# Patient Record
Sex: Female | Born: 1960 | Race: White | Hispanic: No | Smoking: Never smoker
Health system: Southern US, Community
[De-identification: ages and names within clinical notes are randomized; demographics above are authoritative.]

## PROBLEM LIST (undated history)

## (undated) DIAGNOSIS — E042 Nontoxic multinodular goiter: Secondary | ICD-10-CM

## (undated) DIAGNOSIS — K219 Gastro-esophageal reflux disease without esophagitis: Secondary | ICD-10-CM

## (undated) DIAGNOSIS — I10 Essential (primary) hypertension: Secondary | ICD-10-CM

## (undated) DIAGNOSIS — E559 Vitamin D deficiency, unspecified: Secondary | ICD-10-CM

## (undated) DIAGNOSIS — F419 Anxiety disorder, unspecified: Secondary | ICD-10-CM

## (undated) DIAGNOSIS — E038 Other specified hypothyroidism: Secondary | ICD-10-CM

## (undated) DIAGNOSIS — E782 Mixed hyperlipidemia: Secondary | ICD-10-CM

## (undated) DIAGNOSIS — E039 Hypothyroidism, unspecified: Secondary | ICD-10-CM

## (undated) DIAGNOSIS — D518 Other vitamin B12 deficiency anemias: Secondary | ICD-10-CM

## (undated) DIAGNOSIS — I200 Unstable angina: Secondary | ICD-10-CM

## (undated) DIAGNOSIS — F411 Generalized anxiety disorder: Secondary | ICD-10-CM

## (undated) DIAGNOSIS — E1165 Type 2 diabetes mellitus with hyperglycemia: Secondary | ICD-10-CM

## (undated) DIAGNOSIS — M150 Primary generalized (osteo)arthritis: Secondary | ICD-10-CM

## (undated) HISTORY — DX: Hypothyroidism, unspecified: E03.9

## (undated) HISTORY — DX: Essential (primary) hypertension: I10

## (undated) HISTORY — DX: Vitamin D deficiency, unspecified: E55.9

## (undated) HISTORY — DX: Nontoxic multinodular goiter: E04.2

## (undated) HISTORY — DX: Other vitamin B12 deficiency anemias: D51.8

## (undated) HISTORY — DX: Mixed hyperlipidemia: E78.2

## (undated) HISTORY — DX: Unstable angina: I20.0

## (undated) HISTORY — DX: Anxiety disorder, unspecified: F41.9

## (undated) HISTORY — DX: Gastro-esophageal reflux disease without esophagitis: K21.9

## (undated) HISTORY — DX: Primary generalized (osteo)arthritis: M15.0

## (undated) HISTORY — DX: Generalized anxiety disorder: F41.1

## (undated) HISTORY — DX: Type 2 diabetes mellitus with hyperglycemia: E11.65

## (undated) HISTORY — DX: Other specified hypothyroidism: E03.8

---

## 1992-02-09 HISTORY — PX: CHOLECYSTECTOMY: SHX55

## 1999-01-16 ENCOUNTER — Other Ambulatory Visit: Admission: RE | Admit: 1999-01-16 | Discharge: 1999-01-16 | Payer: Self-pay | Admitting: Gynecology

## 2000-04-11 ENCOUNTER — Other Ambulatory Visit: Admission: RE | Admit: 2000-04-11 | Discharge: 2000-04-11 | Payer: Self-pay | Admitting: Gynecology

## 2000-04-12 ENCOUNTER — Other Ambulatory Visit: Admission: RE | Admit: 2000-04-12 | Discharge: 2000-04-12 | Payer: Self-pay | Admitting: Obstetrics and Gynecology

## 2000-05-05 ENCOUNTER — Encounter: Admission: RE | Admit: 2000-05-05 | Discharge: 2000-08-03 | Payer: Self-pay | Admitting: Obstetrics & Gynecology

## 2000-07-19 ENCOUNTER — Ambulatory Visit (HOSPITAL_COMMUNITY): Admission: RE | Admit: 2000-07-19 | Discharge: 2000-07-19 | Payer: Self-pay | Admitting: Obstetrics and Gynecology

## 2000-07-19 ENCOUNTER — Encounter: Payer: Self-pay | Admitting: Obstetrics and Gynecology

## 2000-09-05 ENCOUNTER — Encounter: Admission: RE | Admit: 2000-09-05 | Discharge: 2000-12-04 | Payer: Self-pay | Admitting: Obstetrics & Gynecology

## 2000-10-13 ENCOUNTER — Encounter: Payer: Self-pay | Admitting: Obstetrics and Gynecology

## 2000-10-13 ENCOUNTER — Ambulatory Visit (HOSPITAL_COMMUNITY): Admission: RE | Admit: 2000-10-13 | Discharge: 2000-10-13 | Payer: Self-pay | Admitting: Obstetrics and Gynecology

## 2000-11-28 ENCOUNTER — Encounter: Payer: Self-pay | Admitting: Obstetrics & Gynecology

## 2000-11-28 ENCOUNTER — Ambulatory Visit (HOSPITAL_COMMUNITY): Admission: RE | Admit: 2000-11-28 | Discharge: 2000-11-28 | Payer: Self-pay | Admitting: Obstetrics & Gynecology

## 2000-12-10 ENCOUNTER — Inpatient Hospital Stay (HOSPITAL_COMMUNITY): Admission: AD | Admit: 2000-12-10 | Discharge: 2000-12-13 | Payer: Self-pay | Admitting: Obstetrics and Gynecology

## 2000-12-14 ENCOUNTER — Encounter: Admission: RE | Admit: 2000-12-14 | Discharge: 2001-01-13 | Payer: Self-pay | Admitting: Obstetrics and Gynecology

## 2000-12-17 ENCOUNTER — Inpatient Hospital Stay (HOSPITAL_COMMUNITY): Admission: AD | Admit: 2000-12-17 | Discharge: 2000-12-17 | Payer: Self-pay | Admitting: Obstetrics & Gynecology

## 2001-05-17 ENCOUNTER — Other Ambulatory Visit: Admission: RE | Admit: 2001-05-17 | Discharge: 2001-05-17 | Payer: Self-pay | Admitting: Obstetrics and Gynecology

## 2002-04-19 ENCOUNTER — Other Ambulatory Visit: Admission: RE | Admit: 2002-04-19 | Discharge: 2002-04-19 | Payer: Self-pay | Admitting: Obstetrics and Gynecology

## 2003-04-24 ENCOUNTER — Other Ambulatory Visit: Admission: RE | Admit: 2003-04-24 | Discharge: 2003-04-24 | Payer: Self-pay | Admitting: Obstetrics and Gynecology

## 2003-10-09 ENCOUNTER — Other Ambulatory Visit: Admission: RE | Admit: 2003-10-09 | Discharge: 2003-10-09 | Payer: Self-pay | Admitting: Obstetrics and Gynecology

## 2004-04-29 ENCOUNTER — Other Ambulatory Visit: Admission: RE | Admit: 2004-04-29 | Discharge: 2004-04-29 | Payer: Self-pay | Admitting: Obstetrics and Gynecology

## 2005-05-05 ENCOUNTER — Other Ambulatory Visit: Admission: RE | Admit: 2005-05-05 | Discharge: 2005-05-05 | Payer: Self-pay | Admitting: Obstetrics and Gynecology

## 2016-01-05 ENCOUNTER — Other Ambulatory Visit: Payer: Self-pay | Admitting: Obstetrics and Gynecology

## 2016-01-05 DIAGNOSIS — R928 Other abnormal and inconclusive findings on diagnostic imaging of breast: Secondary | ICD-10-CM

## 2016-01-08 ENCOUNTER — Ambulatory Visit
Admission: RE | Admit: 2016-01-08 | Discharge: 2016-01-08 | Disposition: A | Discharge status: Home / Self Care | Payer: BLUE CROSS/BLUE SHIELD | Source: Ambulatory Visit | Attending: Obstetrics and Gynecology | Admitting: Obstetrics and Gynecology

## 2016-01-08 ENCOUNTER — Ambulatory Visit
Admission: RE | Admit: 2016-01-08 | Discharge: 2016-01-08 | Disposition: A | Discharge status: Home / Self Care | Payer: Self-pay | Source: Ambulatory Visit | Attending: Obstetrics and Gynecology | Admitting: Obstetrics and Gynecology

## 2016-01-08 DIAGNOSIS — R928 Other abnormal and inconclusive findings on diagnostic imaging of breast: Secondary | ICD-10-CM

## 2016-05-20 ENCOUNTER — Other Ambulatory Visit: Payer: Self-pay | Admitting: Obstetrics and Gynecology

## 2016-05-20 DIAGNOSIS — N63 Unspecified lump in unspecified breast: Secondary | ICD-10-CM

## 2016-06-17 ENCOUNTER — Ambulatory Visit
Admission: RE | Admit: 2016-06-17 | Discharge: 2016-06-17 | Disposition: A | Discharge status: Home / Self Care | Payer: BLUE CROSS/BLUE SHIELD | Source: Ambulatory Visit | Attending: Obstetrics and Gynecology | Admitting: Obstetrics and Gynecology

## 2016-06-17 DIAGNOSIS — N63 Unspecified lump in unspecified breast: Secondary | ICD-10-CM

## 2016-06-24 ENCOUNTER — Other Ambulatory Visit: Payer: Self-pay | Admitting: Obstetrics and Gynecology

## 2016-06-25 ENCOUNTER — Other Ambulatory Visit: Payer: Self-pay | Admitting: Obstetrics and Gynecology

## 2016-06-25 DIAGNOSIS — N63 Unspecified lump in unspecified breast: Secondary | ICD-10-CM

## 2016-06-25 DIAGNOSIS — N6489 Other specified disorders of breast: Secondary | ICD-10-CM

## 2017-01-12 ENCOUNTER — Ambulatory Visit
Admission: RE | Admit: 2017-01-12 | Discharge: 2017-01-12 | Disposition: A | Discharge status: Home / Self Care | Payer: BLUE CROSS/BLUE SHIELD | Source: Ambulatory Visit | Attending: Obstetrics and Gynecology | Admitting: Obstetrics and Gynecology

## 2017-01-12 ENCOUNTER — Encounter: Payer: Self-pay | Admitting: Radiology

## 2017-01-12 DIAGNOSIS — N6489 Other specified disorders of breast: Secondary | ICD-10-CM

## 2017-11-29 ENCOUNTER — Other Ambulatory Visit: Payer: Self-pay | Admitting: Obstetrics and Gynecology

## 2017-11-29 DIAGNOSIS — Z1231 Encounter for screening mammogram for malignant neoplasm of breast: Secondary | ICD-10-CM

## 2018-01-16 ENCOUNTER — Ambulatory Visit
Admission: RE | Admit: 2018-01-16 | Discharge: 2018-01-16 | Disposition: A | Discharge status: Home / Self Care | Payer: BLUE CROSS/BLUE SHIELD | Source: Ambulatory Visit | Attending: Obstetrics and Gynecology | Admitting: Obstetrics and Gynecology

## 2018-01-16 DIAGNOSIS — Z1231 Encounter for screening mammogram for malignant neoplasm of breast: Secondary | ICD-10-CM

## 2018-12-11 ENCOUNTER — Other Ambulatory Visit: Payer: Self-pay | Admitting: Obstetrics and Gynecology

## 2018-12-11 DIAGNOSIS — Z1231 Encounter for screening mammogram for malignant neoplasm of breast: Secondary | ICD-10-CM

## 2019-01-31 ENCOUNTER — Ambulatory Visit
Admission: RE | Admit: 2019-01-31 | Discharge: 2019-01-31 | Disposition: A | Discharge status: Home / Self Care | Payer: BC Managed Care – PPO | Source: Ambulatory Visit | Attending: Obstetrics and Gynecology | Admitting: Obstetrics and Gynecology

## 2019-01-31 ENCOUNTER — Other Ambulatory Visit: Payer: Self-pay

## 2019-01-31 DIAGNOSIS — Z1231 Encounter for screening mammogram for malignant neoplasm of breast: Secondary | ICD-10-CM

## 2019-03-27 ENCOUNTER — Encounter: Payer: Self-pay | Admitting: *Deleted

## 2019-03-27 ENCOUNTER — Encounter: Payer: Self-pay | Admitting: Cardiology

## 2019-03-27 DIAGNOSIS — I10 Essential (primary) hypertension: Secondary | ICD-10-CM | POA: Insufficient documentation

## 2019-03-27 DIAGNOSIS — F411 Generalized anxiety disorder: Secondary | ICD-10-CM | POA: Insufficient documentation

## 2019-03-27 DIAGNOSIS — D518 Other vitamin B12 deficiency anemias: Secondary | ICD-10-CM | POA: Insufficient documentation

## 2019-03-27 DIAGNOSIS — E559 Vitamin D deficiency, unspecified: Secondary | ICD-10-CM | POA: Insufficient documentation

## 2019-03-27 DIAGNOSIS — E782 Mixed hyperlipidemia: Secondary | ICD-10-CM | POA: Insufficient documentation

## 2019-03-27 DIAGNOSIS — E1165 Type 2 diabetes mellitus with hyperglycemia: Secondary | ICD-10-CM | POA: Insufficient documentation

## 2019-03-27 DIAGNOSIS — I200 Unstable angina: Secondary | ICD-10-CM | POA: Insufficient documentation

## 2019-03-27 DIAGNOSIS — N841 Polyp of cervix uteri: Secondary | ICD-10-CM | POA: Insufficient documentation

## 2019-03-27 DIAGNOSIS — E039 Hypothyroidism, unspecified: Secondary | ICD-10-CM | POA: Insufficient documentation

## 2019-03-27 DIAGNOSIS — Z6841 Body Mass Index (BMI) 40.0 and over, adult: Secondary | ICD-10-CM

## 2019-03-27 DIAGNOSIS — E038 Other specified hypothyroidism: Secondary | ICD-10-CM | POA: Insufficient documentation

## 2019-03-27 DIAGNOSIS — K219 Gastro-esophageal reflux disease without esophagitis: Secondary | ICD-10-CM | POA: Insufficient documentation

## 2019-03-27 DIAGNOSIS — E042 Nontoxic multinodular goiter: Secondary | ICD-10-CM | POA: Insufficient documentation

## 2019-03-27 DIAGNOSIS — M150 Primary generalized (osteo)arthritis: Secondary | ICD-10-CM | POA: Insufficient documentation

## 2019-03-27 HISTORY — DX: Polyp of cervix uteri: N84.1

## 2019-03-27 HISTORY — DX: Morbid (severe) obesity due to excess calories: E66.01

## 2019-03-27 HISTORY — DX: Body Mass Index (BMI) 40.0 and over, adult: Z684

## 2019-03-28 ENCOUNTER — Other Ambulatory Visit: Payer: Self-pay

## 2019-03-28 ENCOUNTER — Encounter: Payer: Self-pay | Admitting: *Deleted

## 2019-03-28 ENCOUNTER — Encounter: Payer: Self-pay | Admitting: Cardiology

## 2019-03-28 ENCOUNTER — Ambulatory Visit (INDEPENDENT_AMBULATORY_CARE_PROVIDER_SITE_OTHER): Payer: BC Managed Care – PPO | Admitting: Cardiology

## 2019-03-28 VITALS — BP 107/79 | HR 101 | Ht 68.0 in | Wt 329.0 lb

## 2019-03-28 DIAGNOSIS — I4891 Unspecified atrial fibrillation: Secondary | ICD-10-CM | POA: Diagnosis not present

## 2019-03-28 DIAGNOSIS — Z794 Long term (current) use of insulin: Secondary | ICD-10-CM

## 2019-03-28 DIAGNOSIS — Z13220 Encounter for screening for lipoid disorders: Secondary | ICD-10-CM | POA: Diagnosis not present

## 2019-03-28 DIAGNOSIS — I482 Chronic atrial fibrillation, unspecified: Secondary | ICD-10-CM

## 2019-03-28 DIAGNOSIS — I272 Pulmonary hypertension, unspecified: Secondary | ICD-10-CM

## 2019-03-28 DIAGNOSIS — R0602 Shortness of breath: Secondary | ICD-10-CM

## 2019-03-28 DIAGNOSIS — E119 Type 2 diabetes mellitus without complications: Secondary | ICD-10-CM | POA: Insufficient documentation

## 2019-03-28 DIAGNOSIS — Z713 Dietary counseling and surveillance: Secondary | ICD-10-CM | POA: Insufficient documentation

## 2019-03-28 DIAGNOSIS — R0989 Other specified symptoms and signs involving the circulatory and respiratory systems: Secondary | ICD-10-CM

## 2019-03-28 DIAGNOSIS — I10 Essential (primary) hypertension: Secondary | ICD-10-CM | POA: Diagnosis not present

## 2019-03-28 DIAGNOSIS — R931 Abnormal findings on diagnostic imaging of heart and coronary circulation: Secondary | ICD-10-CM

## 2019-03-28 HISTORY — DX: Abnormal findings on diagnostic imaging of heart and coronary circulation: R93.1

## 2019-03-28 HISTORY — DX: Unspecified atrial fibrillation: I48.91

## 2019-03-28 HISTORY — DX: Type 2 diabetes mellitus without complications: E11.9

## 2019-03-28 HISTORY — DX: Dietary counseling and surveillance: Z71.3

## 2019-03-28 HISTORY — DX: Chronic atrial fibrillation, unspecified: I48.20

## 2019-03-28 HISTORY — DX: Other specified symptoms and signs involving the circulatory and respiratory systems: R09.89

## 2019-03-28 HISTORY — DX: Type 2 diabetes mellitus without complications: Z79.4

## 2019-03-28 HISTORY — DX: Pulmonary hypertension, unspecified: I27.20

## 2019-03-28 MED ORDER — OLMESARTAN MEDOXOMIL 20 MG PO TABS: 20.0000 mg | ORAL_TABLET | Freq: Every day | ORAL | 1 refills | Status: AC

## 2019-03-28 MED ORDER — METOPROLOL SUCCINATE ER 25 MG PO TB24: 12.5000 mg | ORAL_TABLET | Freq: Every day | ORAL | 1 refills | Status: DC

## 2019-03-28 MED ORDER — APIXABAN 5 MG PO TABS: 5.0000 mg | ORAL_TABLET | Freq: Two times a day (BID) | ORAL | 5 refills | Status: DC

## 2019-03-28 NOTE — Patient Instructions (Signed)
Medication Instructions:  Your physician recommends that you continue on your current medications as directed. Please refer to the Current Medication list given to you today.  STOP: Aspirin STOP; Omlimsartan/HCTZ  START:Toprol XL(metoprolol succinate) 25 mg Take 1/2 tab (12.5 mg) daily START: Benicar(olmisartan) 20 mg Take 1 tab daily START: Eliquis (apixaban) 5 mg take 1 tab twice daily   *If you need a refill on your cardiac medications before your next appointment, please call your pharmacy*  Lab Work: Your physician recommends that you return for lab work in: TODAY BMP,CBC,MaGNESIUM TSH,lIPID  If you have labs (blood work) drawn today and your tests are completely normal, you will receive your results only by: Marland Kitchen MyChart Message (if you have MyChart) OR . A paper copy in the mail If you have any lab test that is abnormal or we need to change your treatment, we will call you to review the results.  Testing/Procedures:   Follow-Up: At [facility], you and your health needs are our priority.  As part of our continuing mission to provide you with exceptional heart care, we have created designated Provider Care Teams.  These Care Teams include your primary Cardiologist (physician) and Advanced Practice Providers (APPs -  Physician Assistants and Nurse Practitioners) who all work together to provide you with the care you need, when you need it.  Your next appointment:   1 month(s)  The format for your next appointment:   In Person  Provider:   Thomasene Ripple, DO  Other Instructions

## 2019-03-28 NOTE — Progress Notes (Signed)
Cardiology Office Note:    Date:  03/28/2019   ID:  Julane Crock, DOB 1960/09/22, MRN [id number]  PCP:  Galvin Proffer, MD  Cardiologist:  Thomasene Ripple, DO  Electrophysiologist:  None   Referring MD: Galvin Proffer, MD   Chief Complaint  Patient presents with  . Heart Murmur    History of Present Illness:    Suzanne Carey is a 59 y.o. female with a hx of hypertension, hyperlipidemia, morbid obesity, diabetes presents today to be evaluated for abnormal echocardiogram and depressed ejection fraction.  The patient tells me that her PCP did her echocardiogram as he noted that she was due for this.  She denies any chest pain, she admits to intermittent shortness of breath and denies any lightheadedness or dizziness.  I was able to personally review the patient's PCP notes.   Past Medical History:  Diagnosis Date  . Anxiety disorder   . Essential hypertension   . Gastro-esophageal reflux disease without esophagitis   . Generalized anxiety disorder   . Mixed hyperlipidemia   . Morbid obesity (HCC) 03/27/2019  . Nontoxic multinodular goiter   . Other specified hypothyroidism   . Other vitamin B12 deficiency anemias   . Polyp at cervical os 03/27/2019  . Primary generalized (osteo)arthritis   . Type 2 diabetes mellitus with hyperglycemia (HCC)   . Unstable angina (HCC)   . Vitamin D deficiency     Past Surgical History:  Procedure Laterality Date  . CESAREAN SECTION  2002  . CHOLECYSTECTOMY  1994    Current Medications: Current Meds  Medication Sig  . ALPRAZolam (XANAX) 0.5 MG tablet Take 0.5 mg by mouth 3 (three) times daily as needed.   . ergocalciferol (VITAMIN D2) 1.25 MG (50000 UT) capsule ergocalciferol (vitamin D2) 1,250 mcg (50,000 unit) capsule  TAKE 1 CAPSULE BY MOUTH TWICE WEEKLY  . furosemide (LASIX) 40 MG tablet Take 40 mg by mouth daily.  Marland Kitchen liraglutide (VICTOZA) 18 MG/3ML SOPN Victoza 3-Pak 0.6 mg/0.1 mL (18 mg/3 mL) subcutaneous pen injector  INJECT 1.8 MG ONCE A DAY SUBCUTANEOUSLY 30 DAYS  . naproxen (NAPROSYN) 500 MG tablet Take 500 mg by mouth 2 (two) times daily as needed.   . nitroGLYCERIN (NITROSTAT) 0.4 MG SL tablet Place under the tongue.  Marland Kitchen omeprazole (PRILOSEC) 40 MG capsule 40 mg daily.  . pioglitazone (ACTOS) 30 MG tablet Take 30 mg by mouth daily.  . [DISCONTINUED] aspirin 81 MG chewable tablet aspirin 81 mg chewable tablet  Chew 1 tablet every day by oral route.  . [DISCONTINUED] olmesartan-hydrochlorothiazide (BENICAR HCT) 40-25 MG tablet Take 1 tablet by mouth daily.     Allergies:   Dapagliflozin and Latex   Social History   Socioeconomic History  . Marital status: Married    Spouse name: Not on file  . Number of children: Not on file  . Years of education: Not on file  . Highest education level: Not on file  Occupational History  . Not on file  Tobacco Use  . Smoking status: Never Smoker  . Smokeless tobacco: Never Used  Substance and Sexual Activity  . Alcohol use: Never  . Drug use: Never  . Sexual activity: Not on file  Other Topics Concern  . Not on file  Social History Narrative  . Not on file   Social Determinants of Health   Financial Resource Strain:   . Difficulty of Paying Living Expenses: Not on file  Food Insecurity:   . Worried About  Running Out of Food in the Last Year: Not on file  . Ran Out of Food in the Last Year: Not on file  Transportation Needs:   . Lack of Transportation (Medical): Not on file  . Lack of Transportation (Non-Medical): Not on file  Physical Activity:   . Days of Exercise per Week: Not on file  . Minutes of Exercise per Session: Not on file  Stress:   . Feeling of Stress : Not on file  Social Connections:   . Frequency of Communication with Friends and Family: Not on file  . Frequency of Social Gatherings with Friends and Family: Not on file  . Attends Religious Services: Not on file  . Active Member of Clubs or Organizations: Not on file  .  Attends Archivist Meetings: Not on file  . Marital Status: Not on file     Family History: The patient's family history includes Breast cancer (age of onset: 68) in her cousin and paternal aunt; Cancer in her brother; Diabetes in her mother; Heart disease in her mother and sister; Hypertension in her mother and sister; Multiple sclerosis in her sister; Stroke in her mother.  ROS:   Review of Systems  Constitution: Negative for decreased appetite, fever and weight gain.  HENT: Negative for congestion, ear discharge, hoarse voice and sore throat.   Eyes:Reports  Negative for discharge, redness, vision loss in right eye and visual halos.  Cardiovascular: Reports dyspnea on exertion.  Negative for chest pain, leg swelling, orthopnea and palpitations.  Respiratory: Negative for cough, hemoptysis, shortness of breath and snoring.   Endocrine: Negative for heat intolerance and polyphagia.  Hematologic/Lymphatic: Negative for bleeding problem. Does not bruise/bleed easily.  Skin: Negative for flushing, nail changes, rash and suspicious lesions.  Musculoskeletal: Negative for arthritis, joint pain, muscle cramps, myalgias, neck pain and stiffness.  Gastrointestinal: Negative for abdominal pain, bowel incontinence, diarrhea and excessive appetite.  Genitourinary: Negative for decreased libido, genital sores and incomplete emptying.  Neurological: Negative for brief paralysis, focal weakness, headaches and loss of balance.  Psychiatric/Behavioral: Negative for altered mental status, depression and suicidal ideas.  Allergic/Immunologic: Negative for HIV exposure and persistent infections.    EKGs/Labs/Other Studies Reviewed:    The following studies were reviewed today:   EKG:  The ekg ordered today demonstrates atrial fibrillation with rapid ventricular rate, heart rate 101 bpm with low voltage.  No prior EKG for comparison.  Transthoracic echocardiogram done on March 21, 2019  reports technically difficult study rhythm was atrial fibrillation.  Normal left ventricular chambers.  Mildly reduced left ventricular systolic function 45 to 71%.  Mild global hypokinesis.  Mild concentric left ventricular hypertrophy.  No mitral E velocity available for evaluation of diastolic function.  No evidence of left ventricular mass or thrombus.  The left ventricle was normal in function and size.  Mildly dilated left atrium.  Moderately dilated right atrium.  No evidence of intra-atrial shunt.  Aortic valve was not well visualized.  No aortic regurgitation.  No aortic stenosis.  Mitral valve was not well visualized.  Mild mitral vegetation.  No mitral stenosis.  Tricuspid valve not well visualized.  Mild tricuspid regurgitation.  Moderate pulmonary hypertension RSVP 55 mmHg.  Pulmonic valve is not well visualized.  Trace pulmonic regurgitation.  No pulmonic stenosis.  Normal aortic root size.  Normal proximal ascending aortic root size.  Dilated inferior vena cava with inspiratory collapse consistent with right atrial pressure which is elevated 50 mmHg.  Recent Labs: No  results found for requested labs within last 8760 hours.  Recent Lipid Panel No results found for: CHOL, TRIG, HDL, CHOLHDL, VLDL, LDLCALC, LDLDIRECT  Physical Exam:    VS:  BP 107/79 (BP Location: Left Arm, Patient Position: Sitting, Cuff Size: Normal)   Pulse (!) 101   Ht 5\' 8"  (1.727 m)   Wt (!) 329 lb (149.2 kg)   SpO2 98%   BMI 50.02 kg/m     Wt Readings from Last 3 Encounters:  03/28/19 (!) 329 lb (149.2 kg)  03/21/19 (!) 330 lb (149.7 kg)     GEN: Well nourished, well developed in no acute distress HEENT: Normal NECK: No JVD; No carotid bruits LYMPHATICS: No lymphadenopathy CARDIAC: S1S2 noted,RRR, 2/6 systolic murmurs, rubs, gallops RESPIRATORY:  Clear to auscultation without rales, wheezing or rhonchi  ABDOMEN: Soft, non-tender, non-distended, +bowel sounds, no guarding. EXTREMITIES: bilateral leg  edema, No cyanosis, no clubbing MUSCULOSKELETAL:  No deformity  SKIN: Warm and dry NEUROLOGIC:  Alert and oriented x 3, non-focal PSYCHIATRIC:  Normal affect, good insight  ASSESSMENT:    1. Atrial fibrillation, unspecified type (HCC)   2. Lipid screening   3. Essential hypertension   4. Type 2 diabetes mellitus without complication, with long-term current use of insulin (HCC)   5. Morbid obesity (HCC)   6. Depressed left ventricular ejection fraction   7. Pulmonary hypertension (HCC)   8. Shortness of breath    PLAN:     New onset atrial fibrillation-this visit was heavily weighted on educating the patient about her new diagnosis.  Discussing various treatment plans with her and what to expect in terms of long-term treatment with her diagnosis of atrial fibrillation.  I educated the patient about rate control strategy, rhythm control strategy and as well as stroke prevention.  Today her heart rate is not controlled in the office therefore I am going to start her on Toprol-XL 12.5 mg to help with heart rate control I am hoping that if we can slow her heart rate down will be able to have her spontaneously converted to sinus rhythm.  In the event that she had a does not convert to sinus rhythm will likely consider DC cardioversion.  In terms of stroke prevention her CHA2DS2-VASc score is 4 which is an indication for anticoagulation for stroke prevention.  I discussed this with the patient she is willing to proceed with anticoagulation.  She will be started on Eliquis 5 mg twice daily.  She was educated about this medication all of her questions were answered.    I have educated patient on the side effects of this medication. I also urge her to abstain from any taking behaviors.  She understands that she is now at a high risk of bleeding due to being on anticoagulation.  She was also advised that if she ever falls and especially hit her head to be seen in the emergency department.   Depressed  left ventricular systolic function-her EF is mildly depressed 45 to 50% on echocardiogram that was done at another facility which I reviewed the report.  She has been started on low-dose beta-blocker as stated above for atrial fibrillation which hopefully will be able to help with her depressed LVEF.  I suspect that her mildly depressed ejection fraction likely be tachycardia induced.  She does not report any anginal symptoms, her shortness of breath may be contributing also to her arrhythmia.  For now I like to treat the arrhythmia hopefully when she is back she is  back in sinus rhythm if the symptoms persist we will certainly will pursue an ischemic evaluation.  Of note she tells me that probably about 5 to 10 years ago she had a cardiac catheterization which was reported as normal.  She is being treated for hypertension and she is on antihypertensive medication with Benicar/hydrochlorothiazide combination.  Her systolic blood pressures on the lower side today in the lower 100s she tells me this is what it usually is at home.  Today I am going to stop the hydrochlorothiazide, decrease her Benicar to 20 mg as I have started the patient on Toprol-XL for rate control for atrial fibrillation.  I am hoping that the Benicar 20 mg as well as Toprol-XL 12.5 mg to keep the patient blood pressure under a target of 130/80 mmHg.  In addition she is also on Lasix 40 mg daily.  The patient does have nursing, weekly at her house she is going to have the nurse take her blood pressure once a week she is and will require this which she will bring at her next office visit.  Bilateral leg edema-continue patient on her Lasix 40 mg daily.  Diabetes type 2-continue patient on her current diabetic medication per her PCP.  Blood work will be done today which will include BMP, mag, CBC, TSH and lipid profile.  Morbid obesity-the patient understands the need to lose weight with diet and exercise. We have discussed specific  strategies for this.  The patient is in agreement with the above plan. The patient left the office in stable condition.  The patient will follow up in 1 month or sooner if needed.  Total visit time 55 minutes.   Medication Adjustments/Labs and Tests Ordered: Current medicines are reviewed at length with the patient today.  Concerns regarding medicines are outlined above.  Orders Placed This Encounter  Procedures  . Basic Metabolic Panel (BMET)  . Magnesium  . CBC  . TSH  . Lipid Profile  . EKG 12-Lead   Meds ordered this encounter  Medications  . olmesartan (BENICAR) 20 MG tablet    Sig: Take 1 tablet (20 mg total) by mouth daily.    Dispense:  90 tablet    Refill:  1  . metoprolol succinate (TOPROL XL) 25 MG 24 hr tablet    Sig: Take 0.5 tablets (12.5 mg total) by mouth daily.    Dispense:  45 tablet    Refill:  1  . apixaban (ELIQUIS) 5 MG TABS tablet    Sig: Take 1 tablet (5 mg total) by mouth 2 (two) times daily.    Dispense:  60 tablet    Refill:  5    Patient Instructions  Medication Instructions:  Your physician recommends that you continue on your current medications as directed. Please refer to the Current Medication list given to you today.  STOP: Aspirin STOP; Omlimsartan/HCTZ  START:Toprol XL(metoprolol succinate) 25 mg Take 1/2 tab (12.5 mg) daily START: Benicar(olmisartan) 20 mg Take 1 tab daily START: Eliquis (apixaban) 5 mg take 1 tab twice daily   *If you need a refill on your cardiac medications before your next appointment, please call your pharmacy*  Lab Work: Your physician recommends that you return for lab work in: TODAY BMP,CBC,MaGNESIUM TSH,lIPID  If you have labs (blood work) drawn today and your tests are completely normal, you will receive your results only by: Marland Kitchen MyChart Message (if you have MyChart) OR . A paper copy in the mail If you have any lab  test that is abnormal or we need to change your treatment, we will call you to review  the results.  Testing/Procedures:   Follow-Up: At [facility], you and your health needs are our priority.  As part of our continuing mission to provide you with exceptional heart care, we have created designated Provider Care Teams.  These Care Teams include your primary Cardiologist (physician) and Advanced Practice Providers (APPs -  Physician Assistants and Nurse Practitioners) who all work together to provide you with the care you need, when you need it.  Your next appointment:   1 month(s)  The format for your next appointment:   In Person  Provider:   Thomasene Ripple, DO  Other Instructions      Adopting a Healthy Lifestyle.  Know what a healthy weight is for you (roughly BMI <25) and aim to maintain this   Aim for 7+ servings of fruits and vegetables daily   65-80+ fluid ounces of water or unsweet tea for healthy kidneys   Limit to max 1 drink of alcohol per day; avoid smoking/tobacco   Limit animal fats in diet for cholesterol and heart health - choose grass fed whenever available   Avoid highly processed foods, and foods high in saturated/trans fats   Aim for low stress - take time to unwind and care for your mental health   Aim for 150 min of moderate intensity exercise weekly for heart health, and weights twice weekly for bone health   Aim for 7-9 hours of sleep daily   When it comes to diets, agreement about the perfect plan isnt easy to find, even among the experts. Experts at the [facility] [employer] developed an idea known as the Healthy Eating Plate. Just imagine a plate divided into logical, healthy portions.   The emphasis is on diet quality:   Load up on vegetables and fruits - one-half of your plate: Aim for color and variety, and remember that potatoes dont count.   Go for whole grains - one-quarter of your plate: Whole wheat, barley, wheat berries, quinoa, oats, brown rice, and foods made with them. If you want pasta, go with whole  wheat pasta.   Protein power - one-quarter of your plate: Fish, chicken, beans, and nuts are all healthy, versatile protein sources. Limit red meat.   The diet, however, does go beyond the plate, offering a few other suggestions.   Use healthy plant oils, such as olive, canola, soy, corn, sunflower and peanut. Check the labels, and avoid partially hydrogenated oil, which have unhealthy trans fats.   If youre thirsty, drink water. Coffee and tea are good in moderation, but skip sugary drinks and limit milk and dairy products to one or two daily servings.   The type of carbohydrate in the diet is more important than the amount. Some sources of carbohydrates, such as vegetables, fruits, whole grains, and beans-are healthier than others.   Finally, stay active  Signed, Thomasene Ripple, DO  03/28/2019 10:49 PM    [facility]

## 2019-03-29 LAB — CBC
Hematocrit: 39.1 % (ref 34.0–46.6)
Hemoglobin: 13.1 g/dL (ref 11.1–15.9)
MCH: 29.5 pg (ref 26.6–33.0)
MCHC: 33.5 g/dL (ref 31.5–35.7)
MCV: 88 fL (ref 79–97)
Platelets: 322 10*3/uL (ref 150–450)
RBC: 4.44 x10E6/uL (ref 3.77–5.28)
RDW: 13.7 % (ref 11.7–15.4)
WBC: 9.7 10*3/uL (ref 3.4–10.8)

## 2019-03-29 LAB — LIPID PANEL
Chol/HDL Ratio: 4.4 ratio (ref 0.0–4.4)
Cholesterol, Total: 186 mg/dL (ref 100–199)
HDL: 42 mg/dL (ref >39)
LDL Chol Calc (NIH): 116 mg/dL — ABNORMAL HIGH (ref 0–99)
Triglycerides: 157 mg/dL — ABNORMAL HIGH (ref 0–149)
VLDL Cholesterol Cal: 28 mg/dL (ref 5–40)

## 2019-03-29 LAB — TSH: TSH: 2.2 u[IU]/mL (ref 0.450–4.500)

## 2019-03-29 LAB — BASIC METABOLIC PANEL
BUN/Creatinine Ratio: 19 (ref 12–28)
BUN: 23 mg/dL (ref 8–27)
CO2: 25 mmol/L (ref 20–29)
Calcium: 9.9 mg/dL (ref 8.7–10.3)
Chloride: 97 mmol/L (ref 96–106)
Creatinine, Ser: 1.23 mg/dL — ABNORMAL HIGH (ref 0.57–1.00)
GFR calc Af Amer: 55 mL/min/{1.73_m2} — ABNORMAL LOW (ref >59)
GFR calc non Af Amer: 48 mL/min/{1.73_m2} — ABNORMAL LOW (ref >59)
Glucose: 110 mg/dL — ABNORMAL HIGH (ref 65–99)
Potassium: 4.2 mmol/L (ref 3.5–5.2)
Sodium: 139 mmol/L (ref 134–144)

## 2019-03-29 LAB — MAGNESIUM: Magnesium: 1.9 mg/dL (ref 1.6–2.3)

## 2019-04-02 ENCOUNTER — Other Ambulatory Visit: Payer: Self-pay | Admitting: *Deleted

## 2019-04-02 ENCOUNTER — Telehealth: Payer: Self-pay | Admitting: *Deleted

## 2019-04-02 MED ORDER — ATORVASTATIN CALCIUM 20 MG PO TABS: 20.0000 mg | ORAL_TABLET | Freq: Every day | ORAL | 1 refills | Status: DC

## 2019-04-02 NOTE — Telephone Encounter (Signed)
Spoke with patient. Informed of lab results and need to start Lipitor 20 mg Take 1 daily. Called into [facility] in [place]. Called for copy of labs from PCP.

## 2019-04-02 NOTE — Telephone Encounter (Signed)
-----   Message from Thomasene Ripple, DO sent at 04/01/2019  8:40 PM EST ----- Notify patient her creatinine is 1.23, I would like to see her lab work from her PCP office to compare.  Her LDL is slightly elevated 160, triglyceride 157.  If she can tolerate I like to start her on atorvastatin 20 mg daily.

## 2019-04-06 ENCOUNTER — Telehealth: Payer: Self-pay | Admitting: Cardiology

## 2019-04-06 NOTE — Telephone Encounter (Signed)
I spoke with patient and gave her information from Dr Servando Salina. Appointment moved from March 17 to March 8 at 12:00.  Pt reports she has decided not to start Atorvastatin at this time.

## 2019-04-06 NOTE — Telephone Encounter (Signed)
That is not usually a reaction that occurs with these 2 medication.  But I can see her in the office sooner than her scheduled appointment.

## 2019-04-06 NOTE — Telephone Encounter (Signed)
I spoke with patient. Her BP was checked yesterday and it was 110/82.  No other readings available.  She is trying to purchase a BP cuff. Patient reports she has had a headache off and on since Sunday. It will go away and then come back. Also face has felt flushed and looks red several times since Sunday. Happens about 3 times daily. Lasts about 5-10 minutes. Patient is concerned headache and flushing could be related to her new medications.

## 2019-04-06 NOTE — Telephone Encounter (Signed)
New Message    Pt c/o medication issue:  1. Name of Medication: metoprolol succinate (TOPROL XL) 25 MG 24 hr tablet apixaban (ELIQUIS) 5 MG TABS tablet  2. How are you currently taking this medication (dosage and times per day)? Metoprolol 12.5 mg daily  Eliquis 5 mg 2 x daily   3. Are you having a reaction (difficulty breathing--STAT)? No   4. What is your medication issue? Dull headache and face gets real red, she feels like her BP is rising  She had the nurse check it yesterday and it was 110/82   Please call

## 2019-04-16 ENCOUNTER — Ambulatory Visit: Payer: BC Managed Care – PPO | Admitting: Cardiology

## 2019-04-25 ENCOUNTER — Ambulatory Visit: Payer: BC Managed Care – PPO | Admitting: Cardiology

## 2019-04-25 ENCOUNTER — Encounter: Payer: Self-pay | Admitting: Cardiology

## 2019-04-25 ENCOUNTER — Other Ambulatory Visit: Payer: Self-pay

## 2019-04-25 ENCOUNTER — Ambulatory Visit (INDEPENDENT_AMBULATORY_CARE_PROVIDER_SITE_OTHER): Payer: BC Managed Care – PPO | Admitting: Cardiology

## 2019-04-25 VITALS — BP 150/95 | HR 82 | Ht 68.0 in | Wt 340.0 lb

## 2019-04-25 DIAGNOSIS — I272 Pulmonary hypertension, unspecified: Secondary | ICD-10-CM

## 2019-04-25 DIAGNOSIS — I10 Essential (primary) hypertension: Secondary | ICD-10-CM

## 2019-04-25 DIAGNOSIS — Z794 Long term (current) use of insulin: Secondary | ICD-10-CM

## 2019-04-25 DIAGNOSIS — E119 Type 2 diabetes mellitus without complications: Secondary | ICD-10-CM

## 2019-04-25 DIAGNOSIS — I4891 Unspecified atrial fibrillation: Secondary | ICD-10-CM | POA: Diagnosis not present

## 2019-04-25 DIAGNOSIS — R0989 Other specified symptoms and signs involving the circulatory and respiratory systems: Secondary | ICD-10-CM

## 2019-04-25 DIAGNOSIS — E782 Mixed hyperlipidemia: Secondary | ICD-10-CM

## 2019-04-25 MED ORDER — METOPROLOL SUCCINATE ER 25 MG PO TB24: 25.0000 mg | ORAL_TABLET | Freq: Every day | ORAL | 1 refills | Status: DC

## 2019-04-25 MED ORDER — HYDROCHLOROTHIAZIDE 25 MG PO TABS: 25.0000 mg | ORAL_TABLET | Freq: Every day | ORAL | 1 refills | Status: DC

## 2019-04-25 NOTE — Progress Notes (Signed)
Cardiology Office Note:    Date:  04/25/2019   ID:  Suzanne Carey, DOB [birth year], MRN [id number]  PCP:  Galvin Proffer, MD  Cardiologist:  Thomasene Ripple, DO  Electrophysiologist:  None   Referring MD: Galvin Proffer, MD   Chief Complaint  Patient presents with  . Follow-up    History of Present Illness:    Suzanne Carey is a 59 y.o. female with a hx of atrial fibrillation recently started on Eliquis, hypertension, hyperlipidemia, morbid obesity, diabetes presents today for follow-up visit.  Patient March 28, 2019 at that time she was in atrial fibrillation and started on Eliquis this was a new diagnosis, in addition I placed the patient on metoprolol succinate 12.5 mg daily, with avoiding hypotension I stopped her hydrochlorothiazide and her Benicar was decreased to 20 mg daily due to the fact that her systolic blood pressure was she was in the office.  In the interim the patient tells me that her blood pressure has been increasing gradually.  She also has had increasing bilateral leg edema.  Past Medical History:  Diagnosis Date  . Anxiety disorder   . Atrial fibrillation (HCC) 03/28/2019  . Essential hypertension   . Gastro-esophageal reflux disease without esophagitis   . Generalized anxiety disorder   . Mixed hyperlipidemia   . Morbid obesity (HCC) 03/27/2019  . Nontoxic multinodular goiter   . Other specified hypothyroidism   . Other vitamin B12 deficiency anemias   . Polyp at cervical os 03/27/2019  . Primary generalized (osteo)arthritis   . Pulmonary hypertension (HCC) 03/28/2019  . Type 2 diabetes mellitus with hyperglycemia (HCC)   . Type 2 diabetes mellitus without complication, with long-term current use of insulin (HCC) 03/28/2019  . Unstable angina (HCC)   . Vitamin D deficiency     Past Surgical History:  Procedure Laterality Date  . CESAREAN SECTION  2002  . CHOLECYSTECTOMY  1994    Current Medications: Current Meds  Medication Sig  .  ALPRAZolam (XANAX) 0.5 MG tablet Take 0.5 mg by mouth 3 (three) times daily as needed.   Marland Kitchen apixaban (ELIQUIS) 5 MG TABS tablet Take 1 tablet (5 mg total) by mouth 2 (two) times daily.  Marland Kitchen atorvastatin (LIPITOR) 20 MG tablet Take 1 tablet (20 mg total) by mouth daily.  . ergocalciferol (VITAMIN D2) 1.25 MG (50000 UT) capsule ergocalciferol (vitamin D2) 1,250 mcg (50,000 unit) capsule  TAKE 1 CAPSULE BY MOUTH TWICE WEEKLY  . furosemide (LASIX) 40 MG tablet Take 40 mg by mouth daily.  Marland Kitchen liraglutide (VICTOZA) 18 MG/3ML SOPN Victoza 3-Pak 0.6 mg/0.1 mL (18 mg/3 mL) subcutaneous pen injector  INJECT 1.8 MG ONCE A DAY SUBCUTANEOUSLY 30 DAYS  . metoprolol succinate (TOPROL XL) 25 MG 24 hr tablet Take 1 tablet (25 mg total) by mouth daily.  . naproxen (NAPROSYN) 500 MG tablet Take 500 mg by mouth 2 (two) times daily as needed.   . nitroGLYCERIN (NITROSTAT) 0.4 MG SL tablet Place under the tongue.  Marland Kitchen olmesartan (BENICAR) 20 MG tablet Take 1 tablet (20 mg total) by mouth daily.  Marland Kitchen omeprazole (PRILOSEC) 40 MG capsule 40 mg daily.  . pioglitazone (ACTOS) 30 MG tablet Take 30 mg by mouth daily.  . [DISCONTINUED] metoprolol succinate (TOPROL XL) 25 MG 24 hr tablet Take 0.5 tablets (12.5 mg total) by mouth daily.     Allergies:   Dapagliflozin and Latex   Social History   Socioeconomic History  . Marital status: Married  Spouse name: Not on file  . Number of children: Not on file  . Years of education: Not on file  . Highest education level: Not on file  Occupational History  . Not on file  Tobacco Use  . Smoking status: Never Smoker  . Smokeless tobacco: Never Used  Substance and Sexual Activity  . Alcohol use: Never  . Drug use: Never  . Sexual activity: Not on file  Other Topics Concern  . Not on file  Social History Narrative  . Not on file   Social Determinants of Health   Financial Resource Strain:   . Difficulty of Paying Living Expenses:   Food Insecurity:   . Worried About  Programme researcher, broadcasting/film/video in the Last Year:   . Barista in the Last Year:   Transportation Needs:   . Freight forwarder (Medical):   Marland Kitchen Lack of Transportation (Non-Medical):   Physical Activity:   . Days of Exercise per Week:   . Minutes of Exercise per Session:   Stress:   . Feeling of Stress :   Social Connections:   . Frequency of Communication with Friends and Family:   . Frequency of Social Gatherings with Friends and Family:   . Attends Religious Services:   . Active Member of Clubs or Organizations:   . Attends Banker Meetings:   Marland Kitchen Marital Status:      Family History: The patient's family history includes Breast cancer (age of onset: 81) in her cousin and paternal aunt; Cancer in her brother; Diabetes in her mother; Heart disease in her mother and sister; Hypertension in her mother and sister; Multiple sclerosis in her sister; Stroke in her mother.  ROS:   Review of Systems  Constitution: Negative for decreased appetite, fever and weight gain.  HENT: Negative for congestion, ear discharge, hoarse voice and sore throat.   Eyes: Negative for discharge, redness, vision loss in right eye and visual halos.  Cardiovascular: Negative for chest pain, dyspnea on exertion, leg swelling, orthopnea and palpitations.  Respiratory: Negative for cough, hemoptysis, shortness of breath and snoring.   Endocrine: Negative for heat intolerance and polyphagia.  Hematologic/Lymphatic: Negative for bleeding problem. Does not bruise/bleed easily.  Skin: Negative for flushing, nail changes, rash and suspicious lesions.  Musculoskeletal: Negative for arthritis, joint pain, muscle cramps, myalgias, neck pain and stiffness.  Gastrointestinal: Negative for abdominal pain, bowel incontinence, diarrhea and excessive appetite.  Genitourinary: Negative for decreased libido, genital sores and incomplete emptying.  Neurological: Negative for brief paralysis, focal weakness, headaches and  loss of balance.  Psychiatric/Behavioral: Negative for altered mental status, depression and suicidal ideas.  Allergic/Immunologic: Negative for HIV exposure and persistent infections.    EKGs/Labs/Other Studies Reviewed:    The following studies were reviewed today:   EKG: None today  Transthoracic echocardiogram done on March 21, 2019 reports technically difficult study rhythm was atrial fibrillation.  Normal left ventricular chambers.  Mildly reduced left ventricular systolic function 45 to 50%.  Mild global hypokinesis.  Mild concentric left ventricular hypertrophy.  No mitral E velocity available for evaluation of diastolic function.  No evidence of left ventricular mass or thrombus.  The left ventricle was normal in function and size.  Mildly dilated left atrium.  Moderately dilated right atrium.  No evidence of intra-atrial shunt.  Aortic valve was not well visualized.  No aortic regurgitation.  No aortic stenosis.  Mitral valve was not well visualized.  Mild mitral vegetation.  No  mitral stenosis.  Tricuspid valve not well visualized.  Mild tricuspid regurgitation.  Moderate pulmonary hypertension RSVP 55 mmHg.  Pulmonic valve is not well visualized.  Trace pulmonic regurgitation.  No pulmonic stenosis.  Normal aortic root size.  Normal proximal ascending aortic root size.  Dilated inferior vena cava with inspiratory collapse consistent with right atrial pressure which is elevated 50 mmHg.  Recent Labs: 03/28/2019: BUN 23; Creatinine, Ser 1.23; Hemoglobin 13.1; Magnesium 1.9; Platelets 322; Potassium 4.2; Sodium 139; TSH 2.200  Recent Lipid Panel    Component Value Date/Time   CHOL 186 03/28/2019 1136   TRIG 157 (H) 03/28/2019 1136   HDL 42 03/28/2019 1136   CHOLHDL 4.4 03/28/2019 1136   LDLCALC 116 (H) 03/28/2019 1136    Physical Exam:    VS:  BP (!) 150/95 (BP Location: Right Arm, Patient Position: Sitting, Cuff Size: Normal)   Pulse 82   Ht 5\' 8"  (1.727 m)   Wt (!) 340 lb  (154.2 kg)   SpO2 97%   BMI 51.70 kg/m     Wt Readings from Last 3 Encounters:  04/25/19 (!) 340 lb (154.2 kg)  03/28/19 (!) 329 lb (149.2 kg)  03/21/19 (!) 330 lb (149.7 kg)     GEN: Obese female well nourished, well developed in no acute distress HEENT: Normal NECK: No JVD; No carotid bruits LYMPHATICS: No lymphadenopathy CARDIAC: S1S2 noted,RRR, 2/6 systolic murmurs, rubs, gallops RESPIRATORY:  Clear to auscultation without rales, wheezing or rhonchi  ABDOMEN: Soft, non-tender, non-distended, +bowel sounds, no guarding. EXTREMITIES: bilateral +2 leg edema, No cyanosis, no clubbing MUSCULOSKELETAL:  No deformity  SKIN: Warm and dry NEUROLOGIC:  Alert and oriented x 3, non-focal PSYCHIATRIC:  Normal affect, good insight   ASSESSMENT:    1. Essential hypertension   2. Atrial fibrillation, unspecified type (HCC)   3. Pulmonary hypertension (HCC)   4. Type 2 diabetes mellitus without complication, with long-term current use of insulin (HCC)   5. Morbid obesity (HCC)   6. Mixed hyperlipidemia   7. Depressed left ventricular ejection fraction    PLAN:     Unfortunately the patient did not tolerate being off of the hydrochlorothiazide, she became significant weight since I last saw her with bilateral leg edema. Restart her hydrochlorothiazide 25 mg daily.  We will continue continue on her Lasix 40 mg daily.  I also asked the patient to take an extra Lasix dose next week if she continues to gain weight of 40 mg twice a day every other day for 3 days.  Hypertension-patient hypertensive in the office and fortunately she did not want to medication change previously started hydrochlorothiazide 25 mg Benicar 20 mg daily for now, and continue Lasix 40 mg daily.  Also increasing her Toprol-XL to 25 mg daily.  In terms of her atrial fibrillation her rate is controlled today but she tells me that at home she is usually above 100 bpm  and has not seen any heart rate less than 100.  I am  going to increase her Toprol-XL to 25 mg daily.  Continue Eliquis 5 mg twice daily  Obesity - she has started on a new diet and is looking forward to weight loss.   Diabetes mellitus-primary care doctor.  Depressed ejection fraction-continue current management.   The patient is in agreement with the above plan. The patient left the office in stable condition.  The patient will follow up in 2 weeks or sooner if needed.   Medication Adjustments/Labs and Tests Ordered: Current  medicines are reviewed at length with the patient today.  Concerns regarding medicines are outlined above.  No orders of the defined types were placed in this encounter.  Meds ordered this encounter  Medications  . hydrochlorothiazide (HYDRODIURIL) 25 MG tablet    Sig: Take 1 tablet (25 mg total) by mouth daily.    Dispense:  90 tablet    Refill:  1  . metoprolol succinate (TOPROL XL) 25 MG 24 hr tablet    Sig: Take 1 tablet (25 mg total) by mouth daily.    Dispense:  90 tablet    Refill:  1    Patient Instructions  Medication Instructions:  Your physician has recommended you make the following change in your medication:   START: Hydrochlorothiazide 25 mg daily  INCREASE: Metoprolol succinate to 25 mg daily  *If you need a refill on your cardiac medications before your next appointment, please call your pharmacy*   Lab Work: None.  If you have labs (blood work) drawn today and your tests are completely normal, you will receive your results only by: Marland Kitchen. MyChart Message (if you have MyChart) OR . A paper copy in the mail If you have any lab test that is abnormal or we need to change your treatment, we will call you to review the results.   Testing/Procedures: None.   Follow-Up: At [facility], you and your health needs are our priority.  As part of our continuing mission to provide you with exceptional heart care, we have created designated Provider Care Teams.  These Care Teams include your  primary Cardiologist (physician) and Advanced Practice Providers (APPs -  Physician Assistants and Nurse Practitioners) who all work together to provide you with the care you need, when you need it.  We recommend signing up for the patient portal called "MyChart".  Sign up information is provided on this After Visit Summary.  MyChart is used to connect with patients for Virtual Visits (Telemedicine).  Patients are able to view lab/test results, encounter notes, upcoming appointments, etc.  Non-urgent messages can be sent to your provider as well.   To learn more about what you can do with MyChart, go to [email or web address].    Your next appointment:   2 week(s)  The format for your next appointment:   In Person  Provider:   Thomasene RippleKardie Bentleigh Stankus, DO   Other Instructions  Hydrochlorothiazide, HCTZ Oral Capsules or Tablets What is this medicine? HYDROCHLOROTHIAZIDE (hye droe klor oh THYE a zide) is a diuretic. It helps you make more urine and to lose salt and excess water from your body. It treats swelling from heart, kidney, or liver disease. It also treats high blood pressure. This medicine may be used for other purposes; ask your health care provider or pharmacist if you have questions. COMMON BRAND NAME(S): Esidrix, Ezide, HydroDIURIL, Microzide, Oretic, Zide What should I tell my health care provider before I take this medicine? They need to know if you have any of these conditions:  diabetes  gout  immune system problems, like lupus  kidney disease or kidney stones  liver disease  pancreatitis  small amount of urine or difficulty passing urine  an unusual or allergic reaction to hydrochlorothiazide, sulfa drugs, other medicines, foods, dyes, or preservatives  pregnant or trying to get pregnant  breast-feeding How should I use this medicine? Take this drug by mouth. Take it as directed on the prescription label at the same time every day. You can take it with or without  food. If it upsets your stomach, take it with food. Keep taking it unless your health care provider tells you to stop. Talk to your health care provider about the use of this drug in children. While it may be prescribed for children as young as newborns for selected conditions, precautions do apply. Overdosage: If you think you have taken too much of this medicine contact a poison control center or emergency room at once. NOTE: This medicine is only for you. Do not share this medicine with others. What if I miss a dose? If you miss a dose, take it as soon as you can. If it is almost time for your next dose, take only that dose. Do not take double or extra doses. What may interact with this medicine?  cholestyramine  colestipol  digoxin  dofetilide  lithium  medicines for blood pressure  medicines for diabetes  medicines that relax muscles for surgery  other diuretics  steroid medicines like prednisone or cortisone This list may not describe all possible interactions. Give your health care provider a list of all the medicines, herbs, non-prescription drugs, or dietary supplements you use. Also tell them if you smoke, drink alcohol, or use illegal drugs. Some items may interact with your medicine. What should I watch for while using this medicine? Visit your doctor or health care professional for regular checks on your progress. Check your blood pressure as directed. Ask your doctor or health care professional what your blood pressure should be and when you should contact him or her. Talk to your health care professional about your risk of skin cancer. You may be more at risk for skin cancer if you take this medicine. This medicine can make you more sensitive to the sun. Keep out of the sun. If you cannot avoid being in the sun, wear protective clothing and use sunscreen. Do not use sun lamps or tanning beds/booths. You may need to be on a special diet while taking this medicine. Ask  your doctor. Check with your doctor or health care professional if you get an attack of severe diarrhea, nausea and vomiting, or if you sweat a lot. The loss of too much body fluid can make it dangerous for you to take this medicine. You may get drowsy or dizzy. Do not drive, use machinery, or do anything that needs mental alertness until you know how this medicine affects you. Do not stand or sit up quickly, especially if you are an older patient. This reduces the risk of dizzy or fainting spells. Alcohol may interfere with the effect of this medicine. Avoid alcoholic drinks. This medicine may increase blood sugar. Ask your healthcare provider if changes in diet or medicines are needed if you have diabetes. What side effects may I notice from receiving this medicine? Side effects that you should report to your doctor or health care professional as soon as possible:  allergic reactions such as skin rash or itching, hives, swelling of the lips, mouth, tongue, or throat  changes in vision  chest pain  eye pain  fast or irregular heartbeat  feeling faint or lightheaded, falls  gout attack  muscle pain or cramps  pain or difficulty when passing urine  pain, tingling, numbness in the hands or feet  redness, blistering, peeling or loosening of the skin, including inside the mouth   signs and symptoms of high blood sugar such as being more thirsty or hungry or having to urinate more than normal. You may also feel very  tired or have blurry vision.  unusually weak Side effects that usually do not require medical attention (report to your doctor or health care professional if they continue or are bothersome):  change in sex drive or performance  dry mouth  headache  stomach upset This list may not describe all possible side effects. Call your doctor for medical advice about side effects. You may report side effects to FDA at 1-800-FDA-1088. Where should I keep my medicine? Keep out  of the reach of children and pets. Store at room temperature between 20 and 25 degrees C (68 and 77 degrees F). Protect from light and moisture. Keep the container tightly closed. Do not freeze. Throw away any unused drug after the expiration date. NOTE: This sheet is a summary. It may not cover all possible information. If you have questions about this medicine, talk to your doctor, pharmacist, or health care provider.  2020 Elsevier/Gold Standard (2018-09-28 16:52:59)      Adopting a Healthy Lifestyle.  Know what a healthy weight is for you (roughly BMI <25) and aim to maintain this   Aim for 7+ servings of fruits and vegetables daily   65-80+ fluid ounces of water or unsweet tea for healthy kidneys   Limit to max 1 drink of alcohol per day; avoid smoking/tobacco   Limit animal fats in diet for cholesterol and heart health - choose grass fed whenever available   Avoid highly processed foods, and foods high in saturated/trans fats   Aim for low stress - take time to unwind and care for your mental health   Aim for 150 min of moderate intensity exercise weekly for heart health, and weights twice weekly for bone health   Aim for 7-9 hours of sleep daily   When it comes to diets, agreement about the perfect plan isnt easy to find, even among the experts. Experts at the [facility] [employer] developed an idea known as the Healthy Eating Plate. Just imagine a plate divided into logical, healthy portions.   The emphasis is on diet quality:   Load up on vegetables and fruits - one-half of your plate: Aim for color and variety, and remember that potatoes dont count.   Go for whole grains - one-quarter of your plate: Whole wheat, barley, wheat berries, quinoa, oats, brown rice, and foods made with them. If you want pasta, go with whole wheat pasta.   Protein power - one-quarter of your plate: Fish, chicken, beans, and nuts are all healthy, versatile protein sources. Limit  red meat.   The diet, however, does go beyond the plate, offering a few other suggestions.   Use healthy plant oils, such as olive, canola, soy, corn, sunflower and peanut. Check the labels, and avoid partially hydrogenated oil, which have unhealthy trans fats.   If youre thirsty, drink water. Coffee and tea are good in moderation, but skip sugary drinks and limit milk and dairy products to one or two daily servings.   The type of carbohydrate in the diet is more important than the amount. Some sources of carbohydrates, such as vegetables, fruits, whole grains, and beans-are healthier than others.   Finally, stay active  Signed, Thomasene Ripple, DO  04/25/2019 10:42 AM    [facility]

## 2019-04-25 NOTE — Patient Instructions (Addendum)
Medication Instructions:  Your physician has recommended you make the following change in your medication:   START: Hydrochlorothiazide 25 mg daily  INCREASE: Metoprolol succinate to 25 mg daily  *If you need a refill on your cardiac medications before your next appointment, please call your pharmacy*   Lab Work: None.  If you have labs (blood work) drawn today and your tests are completely normal, you will receive your results only by: Marland Kitchen MyChart Message (if you have MyChart) OR . A paper copy in the mail If you have any lab test that is abnormal or we need to change your treatment, we will call you to review the results.   Testing/Procedures: None.   Follow-Up: At [facility], you and your health needs are our priority.  As part of our continuing mission to provide you with exceptional heart care, we have created designated Provider Care Teams.  These Care Teams include your primary Cardiologist (physician) and Advanced Practice Providers (APPs -  Physician Assistants and Nurse Practitioners) who all work together to provide you with the care you need, when you need it.  We recommend signing up for the patient portal called "MyChart".  Sign up information is provided on this After Visit Summary.  MyChart is used to connect with patients for Virtual Visits (Telemedicine).  Patients are able to view lab/test results, encounter notes, upcoming appointments, etc.  Non-urgent messages can be sent to your provider as well.   To learn more about what you can do with MyChart, go to [email or web address].    Your next appointment:   2 week(s)  The format for your next appointment:   In Person  Provider:   Berniece Salines, DO   Other Instructions  Hydrochlorothiazide, HCTZ Oral Capsules or Tablets What is this medicine? HYDROCHLOROTHIAZIDE (hye droe klor oh THYE a zide) is a diuretic. It helps you make more urine and to lose salt and excess water from your body. It treats swelling  from heart, kidney, or liver disease. It also treats high blood pressure. This medicine may be used for other purposes; ask your health care provider or pharmacist if you have questions. COMMON BRAND NAME(S): Esidrix, Ezide, HydroDIURIL, Microzide, Oretic, Zide What should I tell my health care provider before I take this medicine? They need to know if you have any of these conditions:  diabetes  gout  immune system problems, like lupus  kidney disease or kidney stones  liver disease  pancreatitis  small amount of urine or difficulty passing urine  an unusual or allergic reaction to hydrochlorothiazide, sulfa drugs, other medicines, foods, dyes, or preservatives  pregnant or trying to get pregnant  breast-feeding How should I use this medicine? Take this drug by mouth. Take it as directed on the prescription label at the same time every day. You can take it with or without food. If it upsets your stomach, take it with food. Keep taking it unless your health care provider tells you to stop. Talk to your health care provider about the use of this drug in children. While it may be prescribed for children as young as newborns for selected conditions, precautions do apply. Overdosage: If you think you have taken too much of this medicine contact a poison control center or emergency room at once. NOTE: This medicine is only for you. Do not share this medicine with others. What if I miss a dose? If you miss a dose, take it as soon as you can. If it is almost time  for your next dose, take only that dose. Do not take double or extra doses. What may interact with this medicine?  cholestyramine  colestipol  digoxin  dofetilide  lithium  medicines for blood pressure  medicines for diabetes  medicines that relax muscles for surgery  other diuretics  steroid medicines like prednisone or cortisone This list may not describe all possible interactions. Give your health care  provider a list of all the medicines, herbs, non-prescription drugs, or dietary supplements you use. Also tell them if you smoke, drink alcohol, or use illegal drugs. Some items may interact with your medicine. What should I watch for while using this medicine? Visit your doctor or health care professional for regular checks on your progress. Check your blood pressure as directed. Ask your doctor or health care professional what your blood pressure should be and when you should contact him or her. Talk to your health care professional about your risk of skin cancer. You may be more at risk for skin cancer if you take this medicine. This medicine can make you more sensitive to the sun. Keep out of the sun. If you cannot avoid being in the sun, wear protective clothing and use sunscreen. Do not use sun lamps or tanning beds/booths. You may need to be on a special diet while taking this medicine. Ask your doctor. Check with your doctor or health care professional if you get an attack of severe diarrhea, nausea and vomiting, or if you sweat a lot. The loss of too much body fluid can make it dangerous for you to take this medicine. You may get drowsy or dizzy. Do not drive, use machinery, or do anything that needs mental alertness until you know how this medicine affects you. Do not stand or sit up quickly, especially if you are an older patient. This reduces the risk of dizzy or fainting spells. Alcohol may interfere with the effect of this medicine. Avoid alcoholic drinks. This medicine may increase blood sugar. Ask your healthcare provider if changes in diet or medicines are needed if you have diabetes. What side effects may I notice from receiving this medicine? Side effects that you should report to your doctor or health care professional as soon as possible:  allergic reactions such as skin rash or itching, hives, swelling of the lips, mouth, tongue, or throat  changes in vision  chest pain  eye  pain  fast or irregular heartbeat  feeling faint or lightheaded, falls  gout attack  muscle pain or cramps  pain or difficulty when passing urine  pain, tingling, numbness in the hands or feet  redness, blistering, peeling or loosening of the skin, including inside the mouth   signs and symptoms of high blood sugar such as being more thirsty or hungry or having to urinate more than normal. You may also feel very tired or have blurry vision.  unusually weak Side effects that usually do not require medical attention (report to your doctor or health care professional if they continue or are bothersome):  change in sex drive or performance  dry mouth  headache  stomach upset This list may not describe all possible side effects. Call your doctor for medical advice about side effects. You may report side effects to FDA at 1-800-FDA-1088. Where should I keep my medicine? Keep out of the reach of children and pets. Store at room temperature between 20 and 25 degrees C (68 and 77 degrees F). Protect from light and moisture. Keep the container  tightly closed. Do not freeze. Throw away any unused drug after the expiration date. NOTE: This sheet is a summary. It may not cover all possible information. If you have questions about this medicine, talk to your doctor, pharmacist, or health care provider.  2020 Elsevier/Gold Standard (2018-09-28 16:52:59)     [facility] Pre-operative Risk Assessment

## 2019-05-15 ENCOUNTER — Other Ambulatory Visit: Payer: Self-pay

## 2019-05-15 ENCOUNTER — Encounter: Payer: Self-pay | Admitting: Cardiology

## 2019-05-15 ENCOUNTER — Ambulatory Visit: Payer: BC Managed Care – PPO | Admitting: Cardiology

## 2019-05-15 VITALS — BP 124/86 | HR 72 | Ht 68.0 in | Wt 321.0 lb

## 2019-05-15 DIAGNOSIS — I480 Paroxysmal atrial fibrillation: Secondary | ICD-10-CM

## 2019-05-15 DIAGNOSIS — E1165 Type 2 diabetes mellitus with hyperglycemia: Secondary | ICD-10-CM

## 2019-05-15 DIAGNOSIS — I10 Essential (primary) hypertension: Secondary | ICD-10-CM | POA: Diagnosis not present

## 2019-05-15 DIAGNOSIS — R0989 Other specified symptoms and signs involving the circulatory and respiratory systems: Secondary | ICD-10-CM

## 2019-05-15 DIAGNOSIS — E782 Mixed hyperlipidemia: Secondary | ICD-10-CM

## 2019-05-15 NOTE — Progress Notes (Signed)
Cardiology Office Note:    Date:  05/15/2019   ID:  Suzanne Carey, DOB [birth year], MRN [id number]  PCP:  Galvin Proffer, MD  Cardiologist:  Thomasene Ripple, DO  Electrophysiologist:  None   Referring MD: Galvin Proffer, MD   Chief Complaint  Patient presents with  . Follow-up   Follow up for atrial fibrillation , hypertension. " I am feeling better today"  History of Present Illness:    Suzanne Carey is a 59 y.o. female with a hx of atrial fibrillation recently started on Eliquis, hypertension, hyperlipidemia, morbid obesity, diabetes presents today for follow-up visit.   I did see the patient on March 2021 and she did not tolerate hydrochlorothiazide.  She was placed her back on HCTZ along with her Benicar.  She was also hypertensive. Due to rapid ventricular rate I increase her toprol xl to 100mg  daily.   She is here today for a follow up visit.  Past Medical History:  Diagnosis Date  . Anxiety disorder   . Atrial fibrillation (HCC) 03/28/2019  . Essential hypertension   . Gastro-esophageal reflux disease without esophagitis   . Generalized anxiety disorder   . Mixed hyperlipidemia   . Morbid obesity (HCC) 03/27/2019  . Nontoxic multinodular goiter   . Other specified hypothyroidism   . Other vitamin B12 deficiency anemias   . Polyp at cervical os 03/27/2019  . Primary generalized (osteo)arthritis   . Pulmonary hypertension (HCC) 03/28/2019  . Type 2 diabetes mellitus with hyperglycemia (HCC)   . Type 2 diabetes mellitus without complication, with long-term current use of insulin (HCC) 03/28/2019  . Unstable angina (HCC)   . Vitamin D deficiency     Past Surgical History:  Procedure Laterality Date  . CESAREAN SECTION  2002  . CHOLECYSTECTOMY  1994    Current Medications: Current Meds  Medication Sig  . ALPRAZolam (XANAX) 0.5 MG tablet Take 0.5 mg by mouth 3 (three) times daily as needed.   2003 apixaban (ELIQUIS) 5 MG TABS tablet Take 1 tablet (5 mg  total) by mouth 2 (two) times daily.  Marland Kitchen atorvastatin (LIPITOR) 20 MG tablet Take 1 tablet (20 mg total) by mouth daily.  . ergocalciferol (VITAMIN D2) 1.25 MG (50000 UT) capsule ergocalciferol (vitamin D2) 1,250 mcg (50,000 unit) capsule  TAKE 1 CAPSULE BY MOUTH TWICE WEEKLY  . furosemide (LASIX) 40 MG tablet Take 40 mg by mouth daily.  . hydrochlorothiazide (HYDRODIURIL) 25 MG tablet Take 1 tablet (25 mg total) by mouth daily.  Marland Kitchen liraglutide (VICTOZA) 18 MG/3ML SOPN Victoza 3-Pak 0.6 mg/0.1 mL (18 mg/3 mL) subcutaneous pen injector  INJECT 1.8 MG ONCE A DAY SUBCUTANEOUSLY 30 DAYS  . metoprolol succinate (TOPROL XL) 25 MG 24 hr tablet Take 1 tablet (25 mg total) by mouth daily.  . naproxen (NAPROSYN) 500 MG tablet Take 500 mg by mouth 2 (two) times daily as needed.   . nitroGLYCERIN (NITROSTAT) 0.4 MG SL tablet Place under the tongue.  Marland Kitchen olmesartan (BENICAR) 20 MG tablet Take 1 tablet (20 mg total) by mouth daily.  Marland Kitchen omeprazole (PRILOSEC) 40 MG capsule 40 mg daily.  . pioglitazone (ACTOS) 30 MG tablet Take 30 mg by mouth daily.     Allergies:   Dapagliflozin and Latex   Social History   Socioeconomic History  . Marital status: Married    Spouse name: Not on file  . Number of children: Not on file  . Years of education: Not on file  . Highest education  level: Not on file  Occupational History  . Not on file  Tobacco Use  . Smoking status: Never Smoker  . Smokeless tobacco: Never Used  Substance and Sexual Activity  . Alcohol use: Never  . Drug use: Never  . Sexual activity: Not on file  Other Topics Concern  . Not on file  Social History Narrative  . Not on file   Social Determinants of Health   Financial Resource Strain:   . Difficulty of Paying Living Expenses:   Food Insecurity:   . Worried About Programme researcher, broadcasting/film/video in the Last Year:   . Barista in the Last Year:   Transportation Needs:   . Freight forwarder (Medical):   Marland Kitchen Lack of Transportation  (Non-Medical):   Physical Activity:   . Days of Exercise per Week:   . Minutes of Exercise per Session:   Stress:   . Feeling of Stress :   Social Connections:   . Frequency of Communication with Friends and Family:   . Frequency of Social Gatherings with Friends and Family:   . Attends Religious Services:   . Active Member of Clubs or Organizations:   . Attends Banker Meetings:   Marland Kitchen Marital Status:      Family History: The patient's family history includes Breast cancer (age of onset: 67) in her cousin and paternal aunt; Cancer in her brother; Diabetes in her mother; Heart disease in her mother and sister; Hypertension in her mother and sister; Multiple sclerosis in her sister; Stroke in her mother.  ROS:   Review of Systems  Constitution: Negative for decreased appetite, fever and weight gain.  HENT: Negative for congestion, ear discharge, hoarse voice and sore throat.   Eyes: Negative for discharge, redness, vision loss in right eye and visual halos.  Cardiovascular: Negative for chest pain, dyspnea on exertion, leg swelling, orthopnea and palpitations.  Respiratory: Negative for cough, hemoptysis, shortness of breath and snoring.   Endocrine: Negative for heat intolerance and polyphagia.  Hematologic/Lymphatic: Negative for bleeding problem. Does not bruise/bleed easily.  Skin: Negative for flushing, nail changes, rash and suspicious lesions.  Musculoskeletal: Negative for arthritis, joint pain, muscle cramps, myalgias, neck pain and stiffness.  Gastrointestinal: Negative for abdominal pain, bowel incontinence, diarrhea and excessive appetite.  Genitourinary: Negative for decreased libido, genital sores and incomplete emptying.  Neurological: Negative for brief paralysis, focal weakness, headaches and loss of balance.  Psychiatric/Behavioral: Negative for altered mental status, depression and suicidal ideas.  Allergic/Immunologic: Negative for HIV exposure and  persistent infections.    EKGs/Labs/Other Studies Reviewed:    The following studies were reviewed today:   EKG: Atrial fibrillation, heart rate 109 bpm compared to prior EKG no significant change.  Transthoracic echocardiogram done on March 21, 2019 reports technically difficult study rhythm was atrial fibrillation. Normal left ventricular chambers. Mildly reduced left ventricular systolic function 45 to 50%. Mild global hypokinesis. Mild concentric left ventricular hypertrophy. No mitral E velocity available for evaluation of diastolic function. No evidence of left ventricular mass or thrombus. The left ventricle was normal in function and size. Mildly dilated left atrium. Moderately dilated right atrium. No evidence of intra-atrial shunt. Aortic valve was not well visualized. No aortic regurgitation. No aortic stenosis. Mitral valve was not well visualized. Mild mitral vegetation. No mitral stenosis. Tricuspid valve not well visualized. Mild tricuspid regurgitation. Moderate pulmonary hypertension RSVP 55 mmHg. Pulmonic valve is not well visualized. Trace pulmonic regurgitation. No pulmonic stenosis. Normal aortic  root size. Normal proximal ascending aortic root size. Dilated inferior vena cava with inspiratory collapse consistent with right atrial pressure which is elevated 50 mmHg.  Recent Labs: 03/28/2019: BUN 23; Creatinine, Ser 1.23; Hemoglobin 13.1; Magnesium 1.9; Platelets 322; Potassium 4.2; Sodium 139; TSH 2.200  Recent Lipid Panel    Component Value Date/Time   CHOL 186 03/28/2019 1136   TRIG 157 (H) 03/28/2019 1136   HDL 42 03/28/2019 1136   CHOLHDL 4.4 03/28/2019 1136   LDLCALC 116 (H) 03/28/2019 1136    Physical Exam:    VS:  BP 124/86 (BP Location: Right Arm, Patient Position: Sitting, Cuff Size: Normal)   Pulse 72   Ht 5\' 8"  (1.727 m)   Wt (!) 321 lb (145.6 kg)   SpO2 98%   BMI 48.81 kg/m     Wt Readings from Last 3 Encounters:    05/15/19 (!) 321 lb (145.6 kg)  04/25/19 (!) 340 lb (154.2 kg)  03/28/19 (!) 329 lb (149.2 kg)     GEN: Well nourished, well developed in no acute distress HEENT: Normal NECK: No JVD; No carotid bruits LYMPHATICS: No lymphadenopathy CARDIAC: S1S2 noted,RRR, no murmurs, rubs, gallops RESPIRATORY:  Clear to auscultation without rales, wheezing or rhonchi  ABDOMEN: Soft, non-tender, non-distended, +bowel sounds, no guarding. EXTREMITIES: No edema, No cyanosis, no clubbing MUSCULOSKELETAL:  No deformity  SKIN: Warm and dry NEUROLOGIC:  Alert and oriented x 3, non-focal PSYCHIATRIC:  Normal affect, good insight  ASSESSMENT:    1. Essential hypertension   2. Paroxysmal atrial fibrillation (HCC)   3. Type 2 diabetes mellitus with hyperglycemia, without long-term current use of insulin (HCC)   4. Morbid obesity (HCC)   5. Mixed hyperlipidemia   6. Depressed left ventricular ejection fraction    PLAN:    She is in atrial fibrillation today.  We discussed the possibility of DC cardioversion.  The patient wants to wait, there was significant burning on her mother when she last had this procedure therefore she is very nervous want to speak to her family prior to pursuing this procedure.  She will continue on her Eliquis and her rate control agents.  Hypertension-her blood pressure significantly improved since restarting her hydrochlorothiazide.  She will remain on Lasix 40 mg a day, hydrochlorothiazide 25 mg daily, Toprol-XL 25 mg daily, Benicar 20 mg daily.  Diabetes mellitus-Per primary doctor.  Morbid obesity-she has lost 19 pounds since her last visit.  She also has started his new diet powder which she tells me that she lost 2 inches of different parts of her body.  She is working  Hard in losing more weight.   HLN -atorvastatin 20 mg daily.  The patient is in agreement with the above plan. The patient left the office in stable condition.  The patient will follow up  in   Medication Adjustments/Labs and Tests Ordered: Current medicines are reviewed at length with the patient today.  Concerns regarding medicines are outlined above.  No orders of the defined types were placed in this encounter.  No orders of the defined types were placed in this encounter.   There are no Patient Instructions on file for this visit.   Adopting a Healthy Lifestyle.  Know what a healthy weight is for you (roughly BMI <25) and aim to maintain this   Aim for 7+ servings of fruits and vegetables daily   65-80+ fluid ounces of water or unsweet tea for healthy kidneys   Limit to max 1 drink of alcohol  per day; avoid smoking/tobacco   Limit animal fats in diet for cholesterol and heart health - choose grass fed whenever available   Avoid highly processed foods, and foods high in saturated/trans fats   Aim for low stress - take time to unwind and care for your mental health   Aim for 150 min of moderate intensity exercise weekly for heart health, and weights twice weekly for bone health   Aim for 7-9 hours of sleep daily   When it comes to diets, agreement about the perfect plan isnt easy to find, even among the experts. Experts at the [place] developed an idea known as the Healthy Eating Plate. Just imagine a plate divided into logical, healthy portions.   The emphasis is on diet quality:   Load up on vegetables and fruits - one-half of your plate: Aim for color and variety, and remember that potatoes dont count.   Go for whole grains - one-quarter of your plate: Whole wheat, barley, wheat berries, quinoa, oats, brown rice, and foods made with them. If you want pasta, go with whole wheat pasta.   Protein power - one-quarter of your plate: Fish, chicken, beans, and nuts are all healthy, versatile protein sources. Limit red meat.   The diet, however, does go beyond the plate, offering a few other suggestions.   Use healthy plant oils,  such as olive, canola, soy, corn, sunflower and peanut. Check the labels, and avoid partially hydrogenated oil, which have unhealthy trans fats.   If youre thirsty, drink water. Coffee and tea are good in moderation, but skip sugary drinks and limit milk and dairy products to one or two daily servings.   The type of carbohydrate in the diet is more important than the amount. Some sources of carbohydrates, such as vegetables, fruits, whole grains, and beans-are healthier than others.   Finally, stay active  Signed, Berniece Salines, DO  05/15/2019 1:46 PM    [facility]

## 2019-05-15 NOTE — Patient Instructions (Signed)
Medication Instructions:  Your physician recommends that you continue on your current medications as directed. Please refer to the Current Medication list given to you today.  *If you need a refill on your cardiac medications before your next appointment, please call your pharmacy*  Lab Work: Your physician recommends that you lab work today- BMET and Mg  If you have labs (blood work) drawn today and your tests are completely normal, you will receive your results only by: Marland Kitchen MyChart Message (if you have MyChart) OR . A paper copy in the mail If you have any lab test that is abnormal or we need to change your treatment, we will call you to review the results.  Follow-Up: At [facility], you and your health needs are our priority.  As part of our continuing mission to provide you with exceptional heart care, we have created designated Provider Care Teams.  These Care Teams include your primary Cardiologist (physician) and Advanced Practice Providers (APPs -  Physician Assistants and Nurse Practitioners) who all work together to provide you with the care you need, when you need it.  We recommend signing up for the patient portal called "MyChart".  Sign up information is provided on this After Visit Summary.  MyChart is used to connect with patients for Virtual Visits (Telemedicine).  Patients are able to view lab/test results, encounter notes, upcoming appointments, etc.  Non-urgent messages can be sent to your provider as well.   To learn more about what you can do with MyChart, go to [email or web address].    Your next appointment:   3 month(s)  The format for your next appointment:   In Person  Provider:    You will see Kardie Tobb, DO.

## 2019-05-15 NOTE — Addendum Note (Signed)
Addended by: Virl Axe, Donyae Kilner L on: 05/15/2019 02:22 PM   Modules accepted: Orders

## 2019-05-17 LAB — BASIC METABOLIC PANEL
BUN/Creatinine Ratio: 16 (ref 12–28)
BUN: 19 mg/dL (ref 8–27)
CO2: 24 mmol/L (ref 20–29)
Calcium: 9.8 mg/dL (ref 8.7–10.3)
Chloride: 97 mmol/L (ref 96–106)
Creatinine, Ser: 1.21 mg/dL — ABNORMAL HIGH (ref 0.57–1.00)
GFR calc Af Amer: 56 mL/min/{1.73_m2} — ABNORMAL LOW (ref >59)
GFR calc non Af Amer: 49 mL/min/{1.73_m2} — ABNORMAL LOW (ref >59)
Glucose: 138 mg/dL — ABNORMAL HIGH (ref 65–99)
Potassium: 3.4 mmol/L — ABNORMAL LOW (ref 3.5–5.2)
Sodium: 140 mmol/L (ref 134–144)

## 2019-05-17 LAB — MAGNESIUM: Magnesium: 1.7 mg/dL (ref 1.6–2.3)

## 2019-05-18 ENCOUNTER — Telehealth: Payer: Self-pay

## 2019-05-18 NOTE — Telephone Encounter (Signed)
Spoke with patient regarding results and recommendation.  Patient verbalizes understanding but states that she can not take potassium. She states that she is not allergic to it but when she tried taking it in the past she "was not able to take it".   I will route to Dr. Servando Salina for further recommendations.   Advised patient to call back with any issues or concerns.

## 2019-05-18 NOTE — Telephone Encounter (Signed)
-----   Message from Thomasene Ripple, DO sent at 05/17/2019  9:21 PM EDT ----- Your potassium is low, but other labs stable. I will like to start you on potassium chloride 20 meq daily.

## 2019-05-22 NOTE — Telephone Encounter (Signed)
Spoke with patient regarding results and recommendation.  Patient verbalizes understanding and is agreeable to plan of care. Advised patient to call back with any issues or concerns.  

## 2019-05-22 NOTE — Telephone Encounter (Signed)
Please encourage her to continue to foods high in potassium for now.  We will make further recommendations based on her follow-up lab work

## 2019-05-22 NOTE — Telephone Encounter (Signed)
Routing again to Dr. Servando Salina to see if she has any further recommendations.

## 2019-06-25 DIAGNOSIS — Z79899 Other long term (current) drug therapy: Secondary | ICD-10-CM | POA: Diagnosis not present

## 2019-06-25 DIAGNOSIS — E782 Mixed hyperlipidemia: Secondary | ICD-10-CM | POA: Diagnosis not present

## 2019-06-25 DIAGNOSIS — K219 Gastro-esophageal reflux disease without esophagitis: Secondary | ICD-10-CM | POA: Diagnosis not present

## 2019-06-25 DIAGNOSIS — E559 Vitamin D deficiency, unspecified: Secondary | ICD-10-CM | POA: Diagnosis not present

## 2019-06-25 DIAGNOSIS — E1165 Type 2 diabetes mellitus with hyperglycemia: Secondary | ICD-10-CM | POA: Diagnosis not present

## 2019-06-25 DIAGNOSIS — D518 Other vitamin B12 deficiency anemias: Secondary | ICD-10-CM | POA: Diagnosis not present

## 2019-06-25 DIAGNOSIS — I10 Essential (primary) hypertension: Secondary | ICD-10-CM | POA: Diagnosis not present

## 2019-06-25 DIAGNOSIS — M150 Primary generalized (osteo)arthritis: Secondary | ICD-10-CM | POA: Diagnosis not present

## 2019-06-25 DIAGNOSIS — E038 Other specified hypothyroidism: Secondary | ICD-10-CM | POA: Diagnosis not present

## 2019-07-26 ENCOUNTER — Other Ambulatory Visit: Payer: Self-pay | Admitting: Cardiology

## 2019-08-12 ENCOUNTER — Other Ambulatory Visit: Payer: Self-pay | Admitting: Cardiology

## 2019-08-14 ENCOUNTER — Ambulatory Visit: Payer: BC Managed Care – PPO | Admitting: Cardiology

## 2019-08-14 ENCOUNTER — Other Ambulatory Visit: Payer: Self-pay

## 2019-08-14 ENCOUNTER — Encounter: Payer: Self-pay | Admitting: Cardiology

## 2019-08-14 VITALS — BP 126/96 | HR 60 | Ht 68.0 in | Wt 332.0 lb

## 2019-08-14 DIAGNOSIS — I10 Essential (primary) hypertension: Secondary | ICD-10-CM

## 2019-08-14 DIAGNOSIS — I4819 Other persistent atrial fibrillation: Secondary | ICD-10-CM | POA: Diagnosis not present

## 2019-08-14 DIAGNOSIS — E1165 Type 2 diabetes mellitus with hyperglycemia: Secondary | ICD-10-CM | POA: Diagnosis not present

## 2019-08-14 MED ORDER — METOPROLOL SUCCINATE ER 25 MG PO TB24: 25.0000 mg | ORAL_TABLET | Freq: Every day | ORAL | 1 refills | Status: DC

## 2019-08-14 NOTE — Progress Notes (Signed)
Cardiology Office Note:    Date:  08/14/2019   ID:  Suzanne Carey, DOB September 15, 1960, MRN [id number]  PCP:  Galvin Proffer, MD  Cardiologist:  Thomasene Ripple, DO  Electrophysiologist:  None   Referring MD: Galvin Proffer, MD   " I am doing well but stress with everything"  History of Present Illness:    Suzanne Carey is a 59 y.o. female with a hx of atrial fibrillation recently started on Eliquis, hypertension, hyperlipidemia, morbid obesity, diabetes presents today for follow-up visit.   At her last visit we discussed the possibility of DC cardioversion.  But the patient preferred to wait and do rate control as well as anticoagulation.  Her blood pressure was also improved on her regimen therefore no changes were made to her medication regimen.  She is here today for follow-up visit. She reports that from a CV standpoint she has been doing well.  She has gained some weight but is looking forward to exercising again.  Past Medical History:  Diagnosis Date  . Anxiety disorder   . Atrial fibrillation (HCC) 03/28/2019  . Essential hypertension   . Gastro-esophageal reflux disease without esophagitis   . Generalized anxiety disorder   . Mixed hyperlipidemia   . Morbid obesity (HCC) 03/27/2019  . Nontoxic multinodular goiter   . Other specified hypothyroidism   . Other vitamin B12 deficiency anemias   . Polyp at cervical os 03/27/2019  . Primary generalized (osteo)arthritis   . Pulmonary hypertension (HCC) 03/28/2019  . Type 2 diabetes mellitus with hyperglycemia (HCC)   . Type 2 diabetes mellitus without complication, with long-term current use of insulin (HCC) 03/28/2019  . Unstable angina (HCC)   . Vitamin D deficiency     Past Surgical History:  Procedure Laterality Date  . CESAREAN SECTION  2002  . CHOLECYSTECTOMY  1994    Current Medications: Current Meds  Medication Sig  . ALPRAZolam (XANAX) 0.5 MG tablet Take 0.5 mg by mouth 3 (three) times daily as needed.    Marland Kitchen apixaban (ELIQUIS) 5 MG TABS tablet Take 1 tablet (5 mg total) by mouth 2 (two) times daily.  . ergocalciferol (VITAMIN D2) 1.25 MG (50000 UT) capsule ergocalciferol (vitamin D2) 1,250 mcg (50,000 unit) capsule  TAKE 1 CAPSULE BY MOUTH TWICE WEEKLY  . furosemide (LASIX) 40 MG tablet Take 40 mg by mouth daily.  . hydrochlorothiazide (HYDRODIURIL) 25 MG tablet TAKE 1 TABLET BY MOUTH EVERY DAY  . liraglutide (VICTOZA) 18 MG/3ML SOPN Victoza 3-Pak 0.6 mg/0.1 mL (18 mg/3 mL) subcutaneous pen injector  INJECT 1.8 MG ONCE A DAY SUBCUTANEOUSLY 30 DAYS  . metoprolol succinate (TOPROL XL) 25 MG 24 hr tablet Take 1 tablet (25 mg total) by mouth daily.  . nitroGLYCERIN (NITROSTAT) 0.4 MG SL tablet Place under the tongue.  Marland Kitchen olmesartan (BENICAR) 20 MG tablet Take 1 tablet (20 mg total) by mouth daily.  Marland Kitchen omeprazole (PRILOSEC) 40 MG capsule 40 mg daily.  . pioglitazone (ACTOS) 30 MG tablet Take 30 mg by mouth daily.  . [DISCONTINUED] metoprolol succinate (TOPROL XL) 25 MG 24 hr tablet Take 1 tablet (25 mg total) by mouth daily.     Allergies:   Dapagliflozin and Latex   Social History   Socioeconomic History  . Marital status: Married    Spouse name: Not on file  . Number of children: Not on file  . Years of education: Not on file  . Highest education level: Not on file  Occupational History  .  Not on file  Tobacco Use  . Smoking status: Never Smoker  . Smokeless tobacco: Never Used  Substance and Sexual Activity  . Alcohol use: Never  . Drug use: Never  . Sexual activity: Not on file  Other Topics Concern  . Not on file  Social History Narrative  . Not on file   Social Determinants of Health   Financial Resource Strain:   . Difficulty of Paying Living Expenses:   Food Insecurity:   . Worried About Programme researcher, broadcasting/film/video in the Last Year:   . Barista in the Last Year:   Transportation Needs:   . Freight forwarder (Medical):   Marland Kitchen Lack of Transportation (Non-Medical):     Physical Activity:   . Days of Exercise per Week:   . Minutes of Exercise per Session:   Stress:   . Feeling of Stress :   Social Connections:   . Frequency of Communication with Friends and Family:   . Frequency of Social Gatherings with Friends and Family:   . Attends Religious Services:   . Active Member of Clubs or Organizations:   . Attends Banker Meetings:   Marland Kitchen Marital Status:      Family History: The patient's family history includes Breast cancer (age of onset: 20) in her cousin and paternal aunt; Cancer in her brother; Diabetes in her mother; Heart disease in her mother and sister; Hypertension in her mother and sister; Multiple sclerosis in her sister; Stroke in her mother.  ROS:   Review of Systems  Constitution: Negative for decreased appetite, fever and weight gain.  HENT: Negative for congestion, ear discharge, hoarse voice and sore throat.   Eyes: Negative for discharge, redness, vision loss in right eye and visual halos.  Cardiovascular: Negative for chest pain, dyspnea on exertion, leg swelling, orthopnea and palpitations.  Respiratory: Negative for cough, hemoptysis, shortness of breath and snoring.   Endocrine: Negative for heat intolerance and polyphagia.  Hematologic/Lymphatic: Negative for bleeding problem. Does not bruise/bleed easily.  Skin: Negative for flushing, nail changes, rash and suspicious lesions.  Musculoskeletal: Negative for arthritis, joint pain, muscle cramps, myalgias, neck pain and stiffness.  Gastrointestinal: Negative for abdominal pain, bowel incontinence, diarrhea and excessive appetite.  Genitourinary: Negative for decreased libido, genital sores and incomplete emptying.  Neurological: Negative for brief paralysis, focal weakness, headaches and loss of balance.  Psychiatric/Behavioral: Negative for altered mental status, depression and suicidal ideas.  Allergic/Immunologic: Negative for HIV exposure and persistent infections.     EKGs/Labs/Other Studies Reviewed:    The following studies were reviewed today:   EKG: None today  Transthoracic echocardiogram done on March 21, 2019 reports technically difficult study rhythm was atrial fibrillation. Normal left ventricular chambers. Mildly reduced left ventricular systolic function 45 to 50%. Mild global hypokinesis. Mild concentric left ventricular hypertrophy. No mitral E velocity available for evaluation of diastolic function. No evidence of left ventricular mass or thrombus. The left ventricle was normal in function and size. Mildly dilated left atrium. Moderately dilated right atrium. No evidence of intra-atrial shunt. Aortic valve was not well visualized. No aortic regurgitation. No aortic stenosis. Mitral valve was not well visualized. Mild mitral vegetation. No mitral stenosis. Tricuspid valve not well visualized. Mild tricuspid regurgitation. Moderate pulmonary hypertension RSVP 55 mmHg. Pulmonic valve is not well visualized. Trace pulmonic regurgitation. No pulmonic stenosis. Normal aortic root size. Normal proximal ascending aortic root size. Dilated inferior vena cava with inspiratory collapse consistent with right atrial  pressure which is elevated 50 mmHg.  Recent Labs: 03/28/2019: Hemoglobin 13.1; Platelets 322; TSH 2.200 05/17/2019: BUN 19; Creatinine, Ser 1.21; Magnesium 1.7; Potassium 3.4; Sodium 140  Recent Lipid Panel    Component Value Date/Time   CHOL 186 03/28/2019 1136   TRIG 157 (H) 03/28/2019 1136   HDL 42 03/28/2019 1136   CHOLHDL 4.4 03/28/2019 1136   LDLCALC 116 (H) 03/28/2019 1136    Physical Exam:    VS:  BP (!) 126/96   Pulse 60   Ht 5\' 8"  (1.727 m)   Wt (!) 332 lb (150.6 kg)   SpO2 96%   BMI 50.48 kg/m     Wt Readings from Last 3 Encounters:  08/14/19 (!) 332 lb (150.6 kg)  05/15/19 (!) 321 lb (145.6 kg)  04/25/19 (!) 340 lb (154.2 kg)     GEN: Well nourished, well developed in no acute  distress HEENT: Normal NECK: No JVD; No carotid bruits LYMPHATICS: No lymphadenopathy CARDIAC: S1S2 noted,RRR, no murmurs, rubs, gallops RESPIRATORY:  Clear to auscultation without rales, wheezing or rhonchi  ABDOMEN: Soft, non-tender, non-distended, +bowel sounds, no guarding. EXTREMITIES: No edema, No cyanosis, no clubbing MUSCULOSKELETAL:  No deformity  SKIN: Warm and dry NEUROLOGIC:  Alert and oriented x 3, non-focal PSYCHIATRIC:  Normal affect, good insight  ASSESSMENT:    1. Essential hypertension   2. Persistent atrial fibrillation (HCC)   3. Type 2 diabetes mellitus with hyperglycemia, without long-term current use of insulin (HCC)   4. Morbid obesity (HCC)    PLAN:    She still is in A. fib today.  Patient still prefers not to undergo DC cardioversion.  We will continue on her rate control agents with metoprolol succinate 25 mg daily.  And she will continue taking her Eliquis 5 mg twice a day.  Refills has been sent to the pharmacy.  Her blood pressure deceptively in the office today no changes will be made to her antihypertensive regimen.  Which includes hydrochlorothiazide 25 mg daily, Lasix 40 mg daily, Toprol-XL 25 mg daily and Benicar 20 mg daily.  She still is on her diet but has not been as high since she has gained 10 more pounds.  She look forward to working harder to lose more weight.  Hyperlipidemia-continue patient her current statin atorvastatin 20 mg daily.  The patient is in agreement with the above plan. The patient left the office in stable condition.  The patient will follow up in X months or sooner if needed.   Medication Adjustments/Labs and Tests Ordered: Current medicines are reviewed at length with the patient today.  Concerns regarding medicines are outlined above.  No orders of the defined types were placed in this encounter.  Meds ordered this encounter  Medications  . metoprolol succinate (TOPROL XL) 25 MG 24 hr tablet    Sig: Take 1 tablet  (25 mg total) by mouth daily.    Dispense:  90 tablet    Refill:  1    Patient Instructions  Medication Instructions:  Your physician recommends that you continue on your current medications as directed. Please refer to the Current Medication list given to you today.  *If you need a refill on your cardiac medications before your next appointment, please call your pharmacy*   Lab Work: None If you have labs (blood work) drawn today and your tests are completely normal, you will receive your results only by: 04/27/19 MyChart Message (if you have MyChart) OR . A paper copy in the  mail If you have any lab test that is abnormal or we need to change your treatment, we will call you to review the results.   Testing/Procedures: None   Follow-Up: At [facility], you and your health needs are our priority.  As part of our continuing mission to provide you with exceptional heart care, we have created designated Provider Care Teams.  These Care Teams include your primary Cardiologist (physician) and Advanced Practice Providers (APPs -  Physician Assistants and Nurse Practitioners) who all work together to provide you with the care you need, when you need it.  We recommend signing up for the patient portal called "MyChart".  Sign up information is provided on this After Visit Summary.  MyChart is used to connect with patients for Virtual Visits (Telemedicine).  Patients are able to view lab/test results, encounter notes, upcoming appointments, etc.  Non-urgent messages can be sent to your provider as well.   To learn more about what you can do with MyChart, go to [email or web address].    Your next appointment:   6 month(s)  The format for your next appointment:   In Person  Provider:   Thomasene RippleKardie Denee Boeder, DO   Other Instructions      Adopting a Healthy Lifestyle.  Know what a healthy weight is for you (roughly BMI <25) and aim to maintain this   Aim for 7+ servings of fruits and  vegetables daily   65-80+ fluid ounces of water or unsweet tea for healthy kidneys   Limit to max 1 drink of alcohol per day; avoid smoking/tobacco   Limit animal fats in diet for cholesterol and heart health - choose grass fed whenever available   Avoid highly processed foods, and foods high in saturated/trans fats   Aim for low stress - take time to unwind and care for your mental health   Aim for 150 min of moderate intensity exercise weekly for heart health, and weights twice weekly for bone health   Aim for 7-9 hours of sleep daily   When it comes to diets, agreement about the perfect plan isnt easy to find, even among the experts. Experts at the [employer] developed an idea known as the Healthy Eating Plate. Just imagine a plate divided into logical, healthy portions.   The emphasis is on diet quality:   Load up on vegetables and fruits - one-half of your plate: Aim for color and variety, and remember that potatoes dont count.   Go for whole grains - one-quarter of your plate: Whole wheat, barley, wheat berries, quinoa, oats, brown rice, and foods made with them. If you want pasta, go with whole wheat pasta.   Protein power - one-quarter of your plate: Fish, chicken, beans, and nuts are all healthy, versatile protein sources. Limit red meat.   The diet, however, does go beyond the plate, offering a few other suggestions.   Use healthy plant oils, such as olive, canola, soy, corn, sunflower and peanut. Check the labels, and avoid partially hydrogenated oil, which have unhealthy trans fats.   If youre thirsty, drink water. Coffee and tea are good in moderation, but skip sugary drinks and limit milk and dairy products to one or two daily servings.   The type of carbohydrate in the diet is more important than the amount. Some sources of carbohydrates, such as vegetables, fruits, whole grains, and beans-are healthier than others.   Finally, stay  active  Signed, Thomasene RippleKardie Jesscia Imm, DO  08/14/2019 10:38 AM  [facility]

## 2019-08-14 NOTE — Patient Instructions (Signed)

## 2019-09-28 DIAGNOSIS — K219 Gastro-esophageal reflux disease without esophagitis: Secondary | ICD-10-CM | POA: Diagnosis not present

## 2019-09-28 DIAGNOSIS — Z1211 Encounter for screening for malignant neoplasm of colon: Secondary | ICD-10-CM | POA: Diagnosis not present

## 2019-09-28 DIAGNOSIS — E782 Mixed hyperlipidemia: Secondary | ICD-10-CM | POA: Diagnosis not present

## 2019-09-28 DIAGNOSIS — I10 Essential (primary) hypertension: Secondary | ICD-10-CM | POA: Diagnosis not present

## 2019-09-28 DIAGNOSIS — E1165 Type 2 diabetes mellitus with hyperglycemia: Secondary | ICD-10-CM | POA: Diagnosis not present

## 2019-10-07 ENCOUNTER — Other Ambulatory Visit: Payer: Self-pay | Admitting: Cardiology

## 2019-12-17 DIAGNOSIS — E119 Type 2 diabetes mellitus without complications: Secondary | ICD-10-CM | POA: Diagnosis not present

## 2019-12-17 DIAGNOSIS — M722 Plantar fascial fibromatosis: Secondary | ICD-10-CM

## 2019-12-17 DIAGNOSIS — M7732 Calcaneal spur, left foot: Secondary | ICD-10-CM | POA: Diagnosis not present

## 2019-12-17 DIAGNOSIS — M6702 Short Achilles tendon (acquired), left ankle: Secondary | ICD-10-CM

## 2019-12-17 HISTORY — DX: Short Achilles tendon (acquired), left ankle: M67.02

## 2019-12-17 HISTORY — DX: Plantar fascial fibromatosis: M72.2

## 2019-12-28 DIAGNOSIS — D518 Other vitamin B12 deficiency anemias: Secondary | ICD-10-CM | POA: Diagnosis not present

## 2019-12-28 DIAGNOSIS — E782 Mixed hyperlipidemia: Secondary | ICD-10-CM | POA: Diagnosis not present

## 2019-12-28 DIAGNOSIS — Z79899 Other long term (current) drug therapy: Secondary | ICD-10-CM | POA: Diagnosis not present

## 2019-12-28 DIAGNOSIS — E038 Other specified hypothyroidism: Secondary | ICD-10-CM | POA: Diagnosis not present

## 2019-12-28 DIAGNOSIS — K219 Gastro-esophageal reflux disease without esophagitis: Secondary | ICD-10-CM | POA: Diagnosis not present

## 2019-12-28 DIAGNOSIS — E1165 Type 2 diabetes mellitus with hyperglycemia: Secondary | ICD-10-CM | POA: Diagnosis not present

## 2019-12-28 DIAGNOSIS — E559 Vitamin D deficiency, unspecified: Secondary | ICD-10-CM | POA: Diagnosis not present

## 2019-12-28 DIAGNOSIS — I10 Essential (primary) hypertension: Secondary | ICD-10-CM | POA: Diagnosis not present

## 2020-01-07 ENCOUNTER — Other Ambulatory Visit: Payer: Self-pay | Admitting: Cardiology

## 2020-01-08 NOTE — Telephone Encounter (Signed)
Rx refill sent to pharmacy. 

## 2020-01-09 ENCOUNTER — Other Ambulatory Visit: Payer: Self-pay | Admitting: Obstetrics and Gynecology

## 2020-01-09 DIAGNOSIS — Z1231 Encounter for screening mammogram for malignant neoplasm of breast: Secondary | ICD-10-CM

## 2020-01-22 DIAGNOSIS — M722 Plantar fascial fibromatosis: Secondary | ICD-10-CM | POA: Diagnosis not present

## 2020-01-22 DIAGNOSIS — M6702 Short Achilles tendon (acquired), left ankle: Secondary | ICD-10-CM | POA: Diagnosis not present

## 2020-02-12 DIAGNOSIS — U071 COVID-19: Secondary | ICD-10-CM | POA: Diagnosis not present

## 2020-02-12 DIAGNOSIS — Z20828 Contact with and (suspected) exposure to other viral communicable diseases: Secondary | ICD-10-CM | POA: Diagnosis not present

## 2020-02-13 ENCOUNTER — Other Ambulatory Visit: Payer: Self-pay

## 2020-02-13 DIAGNOSIS — F419 Anxiety disorder, unspecified: Secondary | ICD-10-CM | POA: Insufficient documentation

## 2020-02-15 ENCOUNTER — Encounter: Payer: Self-pay | Admitting: Cardiology

## 2020-02-15 ENCOUNTER — Ambulatory Visit: Payer: BC Managed Care – PPO | Admitting: Cardiology

## 2020-02-15 ENCOUNTER — Telehealth: Payer: Self-pay | Admitting: Cardiology

## 2020-02-15 ENCOUNTER — Telehealth: Payer: Self-pay | Admitting: Emergency Medicine

## 2020-02-15 ENCOUNTER — Other Ambulatory Visit: Payer: Self-pay

## 2020-02-15 VITALS — BP 122/60 | HR 99 | Ht 68.0 in | Wt 340.0 lb

## 2020-02-15 DIAGNOSIS — Z79899 Other long term (current) drug therapy: Secondary | ICD-10-CM

## 2020-02-15 DIAGNOSIS — I4819 Other persistent atrial fibrillation: Secondary | ICD-10-CM | POA: Diagnosis not present

## 2020-02-15 DIAGNOSIS — E1165 Type 2 diabetes mellitus with hyperglycemia: Secondary | ICD-10-CM

## 2020-02-15 DIAGNOSIS — I10 Essential (primary) hypertension: Secondary | ICD-10-CM | POA: Diagnosis not present

## 2020-02-15 DIAGNOSIS — F419 Anxiety disorder, unspecified: Secondary | ICD-10-CM

## 2020-02-15 HISTORY — DX: Other long term (current) drug therapy: Z79.899

## 2020-02-15 NOTE — Patient Instructions (Signed)

## 2020-02-15 NOTE — Progress Notes (Signed)
Cardiology Office Note:    Date:  02/15/2020   ID:  Suzanne Carey, DOB [birth year], MRN [id number]  PCP:  Bonnita Nasuti, MD  Cardiologist:  Berniece Salines, DO  Electrophysiologist:  None   Referring MD: Bonnita Nasuti, MD   " I am doing well, just stressed."  History of Present Illness:    Suzanne Carey is a 60 y.o. female with a hx of 60 y.o. female with a hx of atrial fibrillation recently started on Eliquis, hypertension, hyperlipidemia, morbid obesity, diabetes presents today for follow-up visit.  I saw the patient in July 2021 at that time she was in atrial fibrillation but the patient declined DC cardioversion.  We will continue her on metoprolol and Eliquis.  She tells that she has been doing well from a cardiovascular standpoint the main problem is she is still getting some weight and she has had a lot of stress with her family life.  She is teary as she tells me this.  Past Medical History:  Diagnosis Date  . Anxiety disorder   . Atrial fibrillation ([place]) 03/28/2019  . Essential hypertension   . Gastro-esophageal reflux disease without esophagitis   . Generalized anxiety disorder   . Mixed hyperlipidemia   . Morbid obesity ([place]) 03/27/2019  . Nontoxic multinodular goiter   . Other specified hypothyroidism   . Other vitamin B12 deficiency anemias   . Polyp at cervical os 03/27/2019  . Primary generalized (osteo)arthritis   . Pulmonary hypertension ([place]) 03/28/2019  . Type 2 diabetes mellitus with hyperglycemia ([place])   . Type 2 diabetes mellitus without complication, with long-term current use of insulin ([place]) 03/28/2019  . Unstable angina ([place])   . Vitamin D deficiency     Past Surgical History:  Procedure Laterality Date  . CESAREAN SECTION  2002  . CHOLECYSTECTOMY  1994    Current Medications: Current Meds  Medication Sig  . ALPRAZolam (XANAX) 0.5 MG tablet Take 0.5 mg by mouth 3 (three) times daily as needed.   Marland Kitchen atorvastatin (LIPITOR) 20 MG  tablet Take 1 tablet (20 mg total) by mouth daily.  Marland Kitchen DEXILANT 60 MG capsule Take 1 capsule by mouth daily.  Marland Kitchen ELIQUIS 5 MG TABS tablet TAKE 1 TABLET BY MOUTH TWICE A DAY  . ergocalciferol (VITAMIN D2) 1.25 MG (50000 UT) capsule ergocalciferol (vitamin D2) 1,250 mcg (50,000 unit) capsule  TAKE 1 CAPSULE BY MOUTH TWICE WEEKLY  . furosemide (LASIX) 40 MG tablet Take 40 mg by mouth daily.  . hydrochlorothiazide (HYDRODIURIL) 25 MG tablet TAKE 1 TABLET BY MOUTH EVERY DAY  . liraglutide (VICTOZA) 18 MG/3ML SOPN Victoza 3-Pak 0.6 mg/0.1 mL (18 mg/3 mL) subcutaneous pen injector  INJECT 1.8 MG ONCE A DAY SUBCUTANEOUSLY 30 DAYS  . metoprolol succinate (TOPROL-XL) 25 MG 24 hr tablet TAKE 1 TABLET BY MOUTH EVERY DAY  . nitroGLYCERIN (NITROSTAT) 0.4 MG SL tablet Place under the tongue.  Marland Kitchen olmesartan (BENICAR) 20 MG tablet Take 1 tablet (20 mg total) by mouth daily.  Marland Kitchen olmesartan-hydrochlorothiazide (BENICAR HCT) 40-25 MG tablet Take 1 tablet by mouth daily.  Marland Kitchen omeprazole (PRILOSEC) 40 MG capsule 40 mg daily.  . pioglitazone (ACTOS) 30 MG tablet Take 30 mg by mouth daily.  . traMADol (ULTRAM) 50 MG tablet Take 50 mg by mouth daily as needed.     Allergies:   Dapagliflozin and Latex   Social History   Socioeconomic History  . Marital status: Married    Spouse name: Not on file  .  Number of children: Not on file  . Years of education: Not on file  . Highest education level: Not on file  Occupational History  . Not on file  Tobacco Use  . Smoking status: Never Smoker  . Smokeless tobacco: Never Used  Substance and Sexual Activity  . Alcohol use: Never  . Drug use: Never  . Sexual activity: Not on file  Other Topics Concern  . Not on file  Social History Narrative  . Not on file   Social Determinants of Health   Financial Resource Strain: Not on file  Food Insecurity: Not on file  Transportation Needs: Not on file  Physical Activity: Not on file  Stress: Not on file  Social  Connections: Not on file     Family History: The patient's family history includes Breast cancer (age of onset: 50) in her cousin and paternal aunt; Cancer in her brother; Diabetes in her mother; Heart disease in her mother and sister; Hypertension in her mother and sister; Multiple sclerosis in her sister; Stroke in her mother.  ROS:   Review of Systems  Constitution: Negative for decreased appetite, fever and weight gain.  HENT: Negative for congestion, ear discharge, hoarse voice and sore throat.   Eyes: Negative for discharge, redness, vision loss in right eye and visual halos.  Cardiovascular: Negative for chest pain, dyspnea on exertion, leg swelling, orthopnea and palpitations.  Respiratory: Negative for cough, hemoptysis, shortness of breath and snoring.   Endocrine: Negative for heat intolerance and polyphagia.  Hematologic/Lymphatic: Negative for bleeding problem. Does not bruise/bleed easily.  Skin: Negative for flushing, nail changes, rash and suspicious lesions.  Musculoskeletal: Negative for arthritis, joint pain, muscle cramps, myalgias, neck pain and stiffness.  Gastrointestinal: Negative for abdominal pain, bowel incontinence, diarrhea and excessive appetite.  Genitourinary: Negative for decreased libido, genital sores and incomplete emptying.  Neurological: Negative for brief paralysis, focal weakness, headaches and loss of balance.  Psychiatric/Behavioral: Negative for altered mental status, depression and suicidal ideas.  Allergic/Immunologic: Negative for HIV exposure and persistent infections.    EKGs/Labs/Other Studies Reviewed:    The following studies were reviewed today:   EKG: None today  Recent Labs: 03/28/2019: Hemoglobin 13.1; Platelets 322; TSH 2.200 05/17/2019: BUN 19; Creatinine, Ser 1.21; Magnesium 1.7; Potassium 3.4; Sodium 140  Recent Lipid Panel    Component Value Date/Time   CHOL 186 03/28/2019 1136   TRIG 157 (H) 03/28/2019 1136   HDL 42  03/28/2019 1136   CHOLHDL 4.4 03/28/2019 1136   LDLCALC 116 (H) 03/28/2019 1136    Physical Exam:    VS:  BP 122/60   Pulse 99   Ht 5\' 8"  (1.727 m)   Wt (!) 340 lb (154.2 kg)   SpO2 98%   BMI 51.70 kg/m     Wt Readings from Last 3 Encounters:  02/15/20 (!) 340 lb (154.2 kg)  08/14/19 (!) 332 lb (150.6 kg)  05/15/19 (!) 321 lb (145.6 kg)     GEN: Well nourished, well developed in no acute distress HEENT: Normal NECK: No JVD; No carotid bruits LYMPHATICS: No lymphadenopathy CARDIAC: S1S2 noted,RRR, no murmurs, rubs, gallops RESPIRATORY:  Clear to auscultation without rales, wheezing or rhonchi  ABDOMEN: Soft, non-tender, non-distended, +bowel sounds, no guarding. EXTREMITIES: No edema, No cyanosis, no clubbing MUSCULOSKELETAL:  No deformity  SKIN: Warm and dry NEUROLOGIC:  Alert and oriented x 3, non-focal PSYCHIATRIC:  Normal affect, good insight  ASSESSMENT:    1. Persistent atrial fibrillation ([facility])   2.  Essential hypertension   3. Type 2 diabetes mellitus with hyperglycemia, without long-term current use of insulin ([facility])   4. Morbid obesity ([facility])   5. Anxiety disorder, unspecified type    PLAN:     From a cardiovascular standpoint she is stable.  I will continue the patient her current medication regimen.  We discussed weight loss strategies she has been doing medications which has not really been successful.  I asked the patient if she would be willing to be evaluated in our healthy weight and wellness program.  She is agreeable to this.  I am going to refer the patient to the program.   Atrial fibrillation continue patient her metoprolol and Eliquis.  Morbid obesity-plan as above. Diabetes mellitus per her PCP. Hypertension blood pressure susceptible.  The patient is in agreement with the above plan. The patient left the office in stable condition.  The patient will follow up in 6 months or sooner if needed.   Medication Adjustments/Labs and Tests  Ordered: Current medicines are reviewed at length with the patient today.  Concerns regarding medicines are outlined above.  No orders of the defined types were placed in this encounter.  No orders of the defined types were placed in this encounter.   There are no Patient Instructions on file for this visit.   Adopting a Healthy Lifestyle.  Know what a healthy weight is for you (roughly BMI <25) and aim to maintain this   Aim for 7+ servings of fruits and vegetables daily   65-80+ fluid ounces of water or unsweet tea for healthy kidneys   Limit to max 1 drink of alcohol per day; avoid smoking/tobacco   Limit animal fats in diet for cholesterol and heart health - choose grass fed whenever available   Avoid highly processed foods, and foods high in saturated/trans fats   Aim for low stress - take time to unwind and care for your mental health   Aim for 150 min of moderate intensity exercise weekly for heart health, and weights twice weekly for bone health   Aim for 7-9 hours of sleep daily   When it comes to diets, agreement about the perfect plan isnt easy to find, even among the experts. Experts at the [facility] [employer] developed an idea known as the Healthy Eating Plate. Just imagine a plate divided into logical, healthy portions.   The emphasis is on diet quality:   Load up on vegetables and fruits - one-half of your plate: Aim for color and variety, and remember that potatoes dont count.   Go for whole grains - one-quarter of your plate: Whole wheat, barley, wheat berries, quinoa, oats, brown rice, and foods made with them. If you want pasta, go with whole wheat pasta.   Protein power - one-quarter of your plate: Fish, chicken, beans, and nuts are all healthy, versatile protein sources. Limit red meat.   The diet, however, does go beyond the plate, offering a few other suggestions.   Use healthy plant oils, such as olive, canola, soy, corn, sunflower and  peanut. Check the labels, and avoid partially hydrogenated oil, which have unhealthy trans fats.   If youre thirsty, drink water. Coffee and tea are good in moderation, but skip sugary drinks and limit milk and dairy products to one or two daily servings.   The type of carbohydrate in the diet is more important than the amount. Some sources of carbohydrates, such as vegetables, fruits, whole grains, and beans-are healthier  than others.   Finally, stay active  Signed, Thomasene Ripple, DO  02/15/2020 10:14 AM    [facility]

## 2020-02-15 NOTE — Addendum Note (Signed)
Addended by: Hazle Quant on: 02/15/2020 10:29 AM   Modules accepted: Orders

## 2020-02-15 NOTE — Telephone Encounter (Signed)
Patient is requesting to discuss her medication list. She states after her appointment today, 02/15/20 with Dr. Servando Salina she was given a list of medications, however, she states the list is not accurate. Please return call to review.

## 2020-02-15 NOTE — Telephone Encounter (Signed)
Yes I will recommend she takes it.  Her LDL was elevated on her last lipid profile.

## 2020-02-15 NOTE — Telephone Encounter (Signed)
Patient states that she has not been taking atorvastatin and is wondering if she needs to take this or not.   I will route to Dr. Servando Salina to further advise.

## 2020-02-15 NOTE — Telephone Encounter (Signed)
Called [facility] to get labs as Dr. Servando Salina requested. [facility] does not have her in their system apparently. Left message for patient to return call for verify her name and dob and to verify that is where she had labs.

## 2020-02-18 ENCOUNTER — Encounter: Payer: Self-pay | Admitting: Emergency Medicine

## 2020-02-18 MED ORDER — ATORVASTATIN CALCIUM 20 MG PO TABS: 20.0000 mg | ORAL_TABLET | Freq: Every day | ORAL | 1 refills | Status: DC

## 2020-02-18 NOTE — Telephone Encounter (Signed)
Called patient. Informed her that she needs to take atorvastatin. She needs a refill, will fill this.

## 2020-02-18 NOTE — Telephone Encounter (Signed)
Called patient. Informed her Dr. Servando Salina does want her to take atorvastatin. Patient also able to give me correct primary care provider name so I could request labs from their office. No further questions.

## 2020-02-18 NOTE — Addendum Note (Signed)
Addended by: Hazle Quant on: 02/18/2020 09:55 AM   Modules accepted: Orders

## 2020-02-18 NOTE — Addendum Note (Signed)
Addended by: Hazle Quant on: 02/18/2020 09:53 AM   Modules accepted: Orders

## 2020-02-26 ENCOUNTER — Ambulatory Visit: Payer: BC Managed Care – PPO

## 2020-03-13 DIAGNOSIS — M722 Plantar fascial fibromatosis: Secondary | ICD-10-CM | POA: Diagnosis not present

## 2020-03-13 DIAGNOSIS — M6702 Short Achilles tendon (acquired), left ankle: Secondary | ICD-10-CM | POA: Diagnosis not present

## 2020-03-17 DIAGNOSIS — Z01419 Encounter for gynecological examination (general) (routine) without abnormal findings: Secondary | ICD-10-CM | POA: Diagnosis not present

## 2020-03-17 DIAGNOSIS — E7800 Pure hypercholesterolemia, unspecified: Secondary | ICD-10-CM

## 2020-03-17 DIAGNOSIS — I10 Essential (primary) hypertension: Secondary | ICD-10-CM

## 2020-03-17 DIAGNOSIS — Z6841 Body Mass Index (BMI) 40.0 and over, adult: Secondary | ICD-10-CM | POA: Diagnosis not present

## 2020-03-17 HISTORY — DX: Essential (primary) hypertension: I10

## 2020-03-17 HISTORY — DX: Pure hypercholesterolemia, unspecified: E78.00

## 2020-03-26 DIAGNOSIS — E1165 Type 2 diabetes mellitus with hyperglycemia: Secondary | ICD-10-CM | POA: Diagnosis not present

## 2020-03-26 DIAGNOSIS — E782 Mixed hyperlipidemia: Secondary | ICD-10-CM | POA: Diagnosis not present

## 2020-03-26 DIAGNOSIS — I10 Essential (primary) hypertension: Secondary | ICD-10-CM | POA: Diagnosis not present

## 2020-03-26 DIAGNOSIS — K219 Gastro-esophageal reflux disease without esophagitis: Secondary | ICD-10-CM | POA: Diagnosis not present

## 2020-03-27 DIAGNOSIS — I6523 Occlusion and stenosis of bilateral carotid arteries: Secondary | ICD-10-CM | POA: Diagnosis not present

## 2020-03-27 DIAGNOSIS — R0989 Other specified symptoms and signs involving the circulatory and respiratory systems: Secondary | ICD-10-CM | POA: Diagnosis not present

## 2020-03-28 DIAGNOSIS — R229 Localized swelling, mass and lump, unspecified: Secondary | ICD-10-CM | POA: Diagnosis not present

## 2020-03-28 DIAGNOSIS — R590 Localized enlarged lymph nodes: Secondary | ICD-10-CM | POA: Diagnosis not present

## 2020-03-28 DIAGNOSIS — D492 Neoplasm of unspecified behavior of bone, soft tissue, and skin: Secondary | ICD-10-CM | POA: Diagnosis not present

## 2020-03-31 ENCOUNTER — Telehealth: Payer: Self-pay | Admitting: Emergency Medicine

## 2020-03-31 ENCOUNTER — Telehealth: Payer: Self-pay

## 2020-03-31 ENCOUNTER — Telehealth: Payer: Self-pay | Admitting: Cardiology

## 2020-03-31 DIAGNOSIS — E042 Nontoxic multinodular goiter: Secondary | ICD-10-CM | POA: Diagnosis not present

## 2020-03-31 DIAGNOSIS — E038 Other specified hypothyroidism: Secondary | ICD-10-CM | POA: Insufficient documentation

## 2020-03-31 DIAGNOSIS — I6523 Occlusion and stenosis of bilateral carotid arteries: Secondary | ICD-10-CM

## 2020-03-31 DIAGNOSIS — I482 Chronic atrial fibrillation, unspecified: Secondary | ICD-10-CM | POA: Diagnosis not present

## 2020-03-31 DIAGNOSIS — R5383 Other fatigue: Secondary | ICD-10-CM | POA: Diagnosis not present

## 2020-03-31 DIAGNOSIS — E1165 Type 2 diabetes mellitus with hyperglycemia: Secondary | ICD-10-CM | POA: Insufficient documentation

## 2020-03-31 DIAGNOSIS — R0602 Shortness of breath: Secondary | ICD-10-CM | POA: Diagnosis not present

## 2020-03-31 DIAGNOSIS — R221 Localized swelling, mass and lump, neck: Secondary | ICD-10-CM | POA: Diagnosis not present

## 2020-03-31 HISTORY — DX: Occlusion and stenosis of bilateral carotid arteries: I65.23

## 2020-03-31 NOTE — Telephone Encounter (Signed)
Called patient about her appointment  Patient was very upset because she was not being called about her question about her swelling due to the Lipitor. Patient states "Do I need to change my doctor?" I start "Ma'am that is up to you." Patient gets upset and hangs up.   Patient calls back and wants to know should she take the extra dose of Metoprolol as instructed by a previous nurse. Patient told to take the extra dose today, and if her heart rate is greater then 110 bpm tomorrow and Wednesday she should also take the extra dose, total 50 mg. Patient is on schedule foe 04/03/2020 at 1 pm. Dr. Servando Salina aware. Patient encouraged to go to Emergency Department if her symptoms get worst over night and encouraged to monitor her heart rate. Patient apologizes for hanging up.

## 2020-03-31 NOTE — Telephone Encounter (Signed)
Pt c/o medication issue:  1. Name of Medication: atorvastatin (LIPITOR) 20 MG tablet  2. How are you currently taking this medication (dosage and times per day)? Not taking anymore  3. Are you having a reaction (difficulty breathing--STAT)? Was SOB while taking medication  4. What is your medication issue? Patient states she has been off the medication. She states it was a week Friday since she's been off of it. She states it caused swelling in her feet and legs and SOB. She states she cannot take this medication. She states she was also supposed to set up an appointment with another doctor about her weight, but she has not heard anything from them.

## 2020-03-31 NOTE — Telephone Encounter (Signed)
Patient pcp office called. Patient in afib rvr- rate up to 170 at times. Patient refusing to go to the hospital. Spoke with DOD Dr. Bing Matter he advised she increase metoprolol succinate to 50 mg for today and we add her to Dr. Mallory Shirk schedule tomorrow. Patient's pcp aware of this and will inform patient, they report blood pressure is fine to increase the metoprolol. Patient will also be informed per pcp to go to ED if chest pain or increasing symptoms occur overnight.

## 2020-03-31 NOTE — Telephone Encounter (Signed)
 patient. Thank you!

## 2020-04-01 ENCOUNTER — Telehealth: Payer: Self-pay | Admitting: Cardiology

## 2020-04-01 ENCOUNTER — Telehealth: Payer: Self-pay

## 2020-04-01 ENCOUNTER — Ambulatory Visit: Payer: BC Managed Care – PPO | Admitting: Cardiology

## 2020-04-01 NOTE — Telephone Encounter (Signed)
I did call the patient to talk to her about her medications.  Unfortunately she did not answer I left her a voice message that there is an appointment today to see one of my partners Dr. Bing Matter at 3:30pm.  I also asked the patient to call us back as I would be happy to talk to her and answer any questions that she may have.

## 2020-04-01 NOTE — Telephone Encounter (Signed)
She stated she has not been taking if for about a week and is still having swelling. Patient has an appointment Thursday 04/03/2020.

## 2020-04-01 NOTE — Telephone Encounter (Signed)
Spoke with Suzanne Carey from [facility]. He states patient called yesterday and today upset and has questions about medications. I explained to Suzanne Carey that I went over the medication doses for the patient yesterday. Patient was instructed to take a total of 50 mg Metoprolol today and tomorrow once if her heart rate is greater than 110. Since she will see Dr. Servando Salina Thursday. Patient verbalized understanding when I spoke with her yesterday. I will call the patient and reiterate the instructions given to her yesterday.

## 2020-04-01 NOTE — Telephone Encounter (Signed)
She can stop the Lipitor.

## 2020-04-02 DIAGNOSIS — I10 Essential (primary) hypertension: Secondary | ICD-10-CM | POA: Insufficient documentation

## 2020-04-02 DIAGNOSIS — R0602 Shortness of breath: Secondary | ICD-10-CM

## 2020-04-02 DIAGNOSIS — E782 Mixed hyperlipidemia: Secondary | ICD-10-CM | POA: Insufficient documentation

## 2020-04-02 HISTORY — DX: Shortness of breath: R06.02

## 2020-04-03 ENCOUNTER — Ambulatory Visit: Payer: BC Managed Care – PPO | Admitting: Cardiology

## 2020-04-03 ENCOUNTER — Other Ambulatory Visit: Payer: Self-pay

## 2020-04-03 ENCOUNTER — Encounter: Payer: Self-pay | Admitting: Cardiology

## 2020-04-03 VITALS — BP 112/60 | HR 130 | Ht 68.0 in | Wt 360.4 lb

## 2020-04-03 DIAGNOSIS — I10 Essential (primary) hypertension: Secondary | ICD-10-CM

## 2020-04-03 DIAGNOSIS — I4811 Longstanding persistent atrial fibrillation: Secondary | ICD-10-CM | POA: Diagnosis not present

## 2020-04-03 DIAGNOSIS — E1165 Type 2 diabetes mellitus with hyperglycemia: Secondary | ICD-10-CM

## 2020-04-03 DIAGNOSIS — I482 Chronic atrial fibrillation, unspecified: Secondary | ICD-10-CM

## 2020-04-03 MED ORDER — METOLAZONE 2.5 MG PO TABS: 2.5000 mg | ORAL_TABLET | Freq: Every day | ORAL | 0 refills | Status: DC

## 2020-04-03 MED ORDER — FUROSEMIDE 40 MG PO TABS: 40.0000 mg | ORAL_TABLET | Freq: Two times a day (BID) | ORAL | 3 refills | Status: DC

## 2020-04-03 MED ORDER — DIGOXIN 125 MCG PO TABS: 0.1250 mg | ORAL_TABLET | Freq: Every day | ORAL | 3 refills | Status: DC

## 2020-04-03 MED ORDER — POTASSIUM CHLORIDE CRYS ER 20 MEQ PO TBCR: 20.0000 meq | EXTENDED_RELEASE_TABLET | Freq: Every day | ORAL | 3 refills | Status: DC

## 2020-04-03 NOTE — Patient Instructions (Signed)
Medication Instructions:  Your physician has recommended you make the following change in your medication: START: Lasix 40 mg twice daily  START: Potassium 20 meq twice daily START: Digoxin 0.125 mg daily (TODAY AND TOMORROW TAKE 2 TABLETS. Total of 0.250 mg) START: Metolazone 2.5 mg for three days (TAKE 30 MINUTES BEFORE FIRST LASIX DOSE) *If you need a refill on your cardiac medications before your next appointment, please call your pharmacy*   Lab Work: Your physician recommends that you return for lab work:  TODAY: BMET, Mag, BNP  If you have labs (blood work) drawn today and your tests are completely normal, you will receive your results only by: Marland Kitchen MyChart Message (if you have MyChart) OR . A paper copy in the mail If you have any lab test that is abnormal or we need to change your treatment, we will call you to review the results.   Testing/Procedures: None   Follow-Up: At [facility], you and your health needs are our priority.  As part of our continuing mission to provide you with exceptional heart care, we have created designated Provider Care Teams.  These Care Teams include your primary Cardiologist (physician) and Advanced Practice Providers (APPs -  Physician Assistants and Nurse Practitioners) who all work together to provide you with the care you need, when you need it.  We recommend signing up for the patient portal called "MyChart".  Sign up information is provided on this After Visit Summary.  MyChart is used to connect with patients for Virtual Visits (Telemedicine).  Patients are able to view lab/test results, encounter notes, upcoming appointments, etc.  Non-urgent messages can be sent to your provider as well.   To learn more about what you can do with MyChart, go to [email or web address].    Your next appointment:   1 week(s)  The format for your next appointment:   In Person  Provider:   Thomasene Ripple, DO   Other Instructions

## 2020-04-03 NOTE — Progress Notes (Signed)
Cardiology Office Note:    Date:  04/03/2020   ID:  Suzanne Carey, DOB [birth year], MRN [id number]  PCP:  Galvin Proffer, MD  Cardiologist:  Thomasene Ripple, DO  Electrophysiologist:  None   Referring MD: Galvin Proffer, MD   I am swelling up  History of Present Illness:    Suzanne Carey is a 60 y.o. female with a hx of atrial fibrillation recently started on Eliquis, hypertension, hyperlipidemia, morbid obesity, diabetes presents today for follow-up visit.  I saw the patient in July 2021 at that time she was in atrial fibrillation but the patient declined DC cardioversion.  We will continue her on metoprolol and Eliquis.  At her last visit on February 09, 2020 we talked about cardioversion which she also did decline.  We also discussed weight loss and I referred the patient to our healthy wellness program. She is here today because she has been experiencing increasing leg edema as well as rapid ventricular rate of atrial fibrillation. She did see her PCP in the interim who increase her Lopressor and asked her to see cardiology.  She is here today for follow-up visit.  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  She is going through a lot socially.  Her husband is having some problems as he currently is incarcerated and going to courts.  Past Medical History:  Diagnosis Date  . Anxiety disorder   . Atrial fibrillation (HCC) 03/28/2019  . Chronic atrial fibrillation, unspecified (HCC) 03/28/2019  . Depressed left ventricular ejection fraction 03/28/2019  . Diabetes mellitus (HCC) 03/28/2019  . Dietary counseling and surveillance 03/28/2019  . Essential hypertension   . Gastro-esophageal reflux disease without esophagitis   . Generalized anxiety disorder   . Hypercholesterolemia 03/17/2020  . Hyperglycemia due to  type 2 diabetes mellitus (HCC)   . Hypertensive disorder 03/17/2020  . Hypothyroidism   . Mixed hyperlipidemia   . Morbid obesity (HCC) 03/27/2019  . Non-toxic multinodular goiter   . Nontoxic multinodular goiter   . Occlusion and stenosis of bilateral carotid arteries 03/31/2020  . Other long term (current) drug therapy 02/15/2020  . Other specified hypothyroidism   . Other vitamin B12 deficiency anemias   . Plantar fasciitis 12/17/2019  . Polyp at cervical os 03/27/2019  . Primary generalized (osteo)arthritis   . Pulmonary hypertension (HCC) 03/28/2019  . Shortness of breath 04/02/2020  . Tightness of heel cord, left 12/17/2019  . Type 2 diabetes mellitus with hyperglycemia (HCC)   . Type 2 diabetes mellitus without complication, with long-term current use of insulin (HCC) 03/28/2019  . Unstable angina (HCC)   . Vitamin D deficiency     Past Surgical History:  Procedure Laterality Date  . CESAREAN SECTION  2002  . CHOLECYSTECTOMY  1994    Current Medications: Current Meds  Medication Sig  . ALPRAZolam (XANAX) 0.5 MG tablet Take 0.5 mg by mouth 3 (three) times daily as needed.   Marland Kitchen atorvastatin (LIPITOR) 20 MG tablet Take 1 tablet (20 mg total) by mouth daily.  . digoxin (LANOXIN) 0.125 MG tablet Take 1 tablet (0.125 mg total) by mouth daily.  Marland Kitchen ELIQUIS 5 MG TABS tablet TAKE 1 TABLET BY MOUTH TWICE A DAY  . ergocalciferol (VITAMIN D2) 1.25 MG (50000 UT) capsule ergocalciferol (vitamin D2) 1,250  mcg (50,000 unit) capsule  TAKE 1 CAPSULE BY MOUTH TWICE WEEKLY  . furosemide (LASIX) 40 MG tablet Take 1 tablet (40 mg total) by mouth 2 (two) times daily.  . hydrochlorothiazide (HYDRODIURIL) 25 MG tablet TAKE 1 TABLET BY MOUTH EVERY DAY  . liraglutide (VICTOZA) 18 MG/3ML SOPN Victoza 3-Pak 0.6 mg/0.1 mL (18 mg/3 mL) subcutaneous pen injector  INJECT 1.8 MG ONCE A DAY SUBCUTANEOUSLY 30 DAYS  . metolazone (ZAROXOLYN) 2.5 MG tablet Take 1 tablet (2.5 mg total) by mouth daily.  . metoprolol  succinate (TOPROL-XL) 25 MG 24 hr tablet TAKE 1 TABLET BY MOUTH EVERY DAY  . nitroGLYCERIN (NITROSTAT) 0.4 MG SL tablet Place under the tongue.  Marland Kitchen olmesartan (BENICAR) 20 MG tablet Take 1 tablet (20 mg total) by mouth daily.  Marland Kitchen omeprazole (PRILOSEC) 40 MG capsule 40 mg daily.  . pioglitazone (ACTOS) 30 MG tablet Take 30 mg by mouth daily.  . potassium chloride SA (KLOR-CON M20) 20 MEQ tablet Take 1 tablet (20 mEq total) by mouth daily.  . traMADol (ULTRAM) 50 MG tablet Take 50 mg by mouth daily as needed.  . [DISCONTINUED] furosemide (LASIX) 40 MG tablet Take 40 mg by mouth daily.     Allergies:   Dapagliflozin and Latex   Social History   Socioeconomic History  . Marital status: Married    Spouse name: Not on file  . Number of children: Not on file  . Years of education: Not on file  . Highest education level: Not on file  Occupational History  . Not on file  Tobacco Use  . Smoking status: Never Smoker  . Smokeless tobacco: Never Used  Substance and Sexual Activity  . Alcohol use: Never  . Drug use: Never  . Sexual activity: Not on file  Other Topics Concern  . Not on file  Social History Narrative  . Not on file   Social Determinants of Health   Financial Resource Strain: Not on file  Food Insecurity: Not on file  Transportation Needs: Not on file  Physical Activity: Not on file  Stress: Not on file  Social Connections: Not on file     Family History: The patient's family history includes Breast cancer (age of onset: 29) in her cousin and paternal aunt; Cancer in her brother; Diabetes in her mother; Heart disease in her mother and sister; Hypertension in her mother and sister; Multiple sclerosis in her sister; Stroke in her mother.  ROS:   Review of Systems  Constitution: Negative for decreased appetite, fever and weight gain.  HENT: Negative for congestion, ear discharge, hoarse voice and sore throat.   Eyes: Negative for discharge, redness, vision loss in right  eye and visual halos.  Cardiovascular: Negative for chest pain, dyspnea on exertion, leg swelling, orthopnea and palpitations.  Respiratory: Negative for cough, hemoptysis, shortness of breath and snoring.   Endocrine: Negative for heat intolerance and polyphagia.  Hematologic/Lymphatic: Negative for bleeding problem. Does not bruise/bleed easily.  Skin: Negative for flushing, nail changes, rash and suspicious lesions.  Musculoskeletal: Negative for arthritis, joint pain, muscle cramps, myalgias, neck pain and stiffness.  Gastrointestinal: Negative for abdominal pain, bowel incontinence, diarrhea and excessive appetite.  Genitourinary: Negative for decreased libido, genital sores and incomplete emptying.  Neurological: Negative for brief paralysis, focal weakness, headaches and loss of balance.  Psychiatric/Behavioral: Negative for altered mental status, depression and suicidal ideas.  Allergic/Immunologic: Negative for HIV exposure and persistent infections.    EKGs/Labs/Other Studies Reviewed:    The  following studies were reviewed today:   EKG:  The ekg ordered today demonstrates atrial fibrillation with rapid ventricular rate and occasional PVCs.  Recent Labs: 05/17/2019: BUN 19; Creatinine, Ser 1.21; Magnesium 1.7; Potassium 3.4; Sodium 140  Recent Lipid Panel    Component Value Date/Time   CHOL 186 03/28/2019 1136   TRIG 157 (H) 03/28/2019 1136   HDL 42 03/28/2019 1136   CHOLHDL 4.4 03/28/2019 1136   LDLCALC 116 (H) 03/28/2019 1136    Physical Exam:    VS:  BP 112/60   Pulse (!) 130   Ht 5\' 8"  (1.727 m)   Wt (!) 360 lb 6.4 oz (163.5 kg)   SpO2 96%   BMI 54.80 kg/m     Wt Readings from Last 3 Encounters:  04/03/20 (!) 360 lb 6.4 oz (163.5 kg)  02/15/20 (!) 340 lb (154.2 kg)  08/14/19 (!) 332 lb (150.6 kg)     GEN: Well nourished, well developed in no acute distress HEENT: Normal NECK: No JVD; No carotid bruits LYMPHATICS: No lymphadenopathy CARDIAC: S1S2  noted,RRR, no murmurs, rubs, gallops RESPIRATORY:  Clear to auscultation without rales, wheezing or rhonchi  ABDOMEN: Soft, non-tender, non-distended, +bowel sounds, no guarding. EXTREMITIES: +3 bilateral pretibial above-knee edema, No cyanosis, no clubbing MUSCULOSKELETAL:  No deformity  SKIN: Warm and dry NEUROLOGIC:  Alert and oriented x 3, non-focal PSYCHIATRIC:  Normal affect, good insight  ASSESSMENT:    1. Longstanding persistent atrial fibrillation (HCC)   2. Primary hypertension   3. Type 2 diabetes mellitus with hyperglycemia, without long-term current use of insulin (HCC)   4. Morbid obesity (HCC)   5. Chronic atrial fibrillation, unspecified (HCC)    PLAN:     She appears to be volume overloaded in the office today I have asked the patient that she needs to go to the emergency department as she will need IV Lasix and be admitted for a day or 2.  She has declined.  I have no choice but to increase her Lasix 40 mg twice a day.  As well as start her on metolazone 2.5 mg for 3 days 30 minutes before her first Lasix dose.  We will also increase her potassium supplement to 20 mEq twice daily.  I will see her in 1 week.  Have also asked the patient if this is not working over the weekend to go to the emergency department.  In terms of atrial fibrillation she is a rapid ventricular rate today.  What I like to do is keep her had her current dose of beta-blocker and start the patient on digoxin 250 mcg today, 250 mcg tomorrow and then 125 mcg daily.  She was not able to connect with our healthy wellness program will refer the patient again.  We will continue to monitor her blood pressure closely on the increased dose of diuretics.  This is being managed by his primary care doctor.  No adjustments for antidiabetic medications were made today.    The patient is in agreement with the above plan. The patient left the office in stable condition.  The patient will follow up in 1 week or  sooner if needed.   Medication Adjustments/Labs and Tests Ordered: Current medicines are reviewed at length with the patient today.  Concerns regarding medicines are outlined above.  Orders Placed This Encounter  Procedures  . Basic metabolic panel  . Magnesium  . Pro b natriuretic peptide (BNP)  . Amb Ref to Medical Weight Management  . EKG  12-Lead   Meds ordered this encounter  Medications  . furosemide (LASIX) 40 MG tablet    Sig: Take 1 tablet (40 mg total) by mouth 2 (two) times daily.    Dispense:  180 tablet    Refill:  3  . potassium chloride SA (KLOR-CON M20) 20 MEQ tablet    Sig: Take 1 tablet (20 mEq total) by mouth daily.    Dispense:  180 tablet    Refill:  3  . metolazone (ZAROXOLYN) 2.5 MG tablet    Sig: Take 1 tablet (2.5 mg total) by mouth daily.    Dispense:  3 tablet    Refill:  0  . digoxin (LANOXIN) 0.125 MG tablet    Sig: Take 1 tablet (0.125 mg total) by mouth daily.    Dispense:  90 tablet    Refill:  3    Patient Instructions  Medication Instructions:  Your physician has recommended you make the following change in your medication: START: Lasix 40 mg twice daily  START: Potassium 20 meq twice daily START: Digoxin 0.125 mg daily (TODAY AND TOMORROW TAKE 2 TABLETS. Total of 0.250 mg) START: Metolazone 2.5 mg for three days (TAKE 30 MINUTES BEFORE FIRST LASIX DOSE) *If you need a refill on your cardiac medications before your next appointment, please call your pharmacy*   Lab Work: Your physician recommends that you return for lab work:  TODAY: BMET, Mag, BNP  If you have labs (blood work) drawn today and your tests are completely normal, you will receive your results only by: Marland Kitchen MyChart Message (if you have MyChart) OR . A paper copy in the mail If you have any lab test that is abnormal or we need to change your treatment, we will call you to review the results.   Testing/Procedures: None   Follow-Up: At [facility], you and your  health needs are our priority.  As part of our continuing mission to provide you with exceptional heart care, we have created designated Provider Care Teams.  These Care Teams include your primary Cardiologist (physician) and Advanced Practice Providers (APPs -  Physician Assistants and Nurse Practitioners) who all work together to provide you with the care you need, when you need it.  We recommend signing up for the patient portal called "MyChart".  Sign up information is provided on this After Visit Summary.  MyChart is used to connect with patients for Virtual Visits (Telemedicine).  Patients are able to view lab/test results, encounter notes, upcoming appointments, etc.  Non-urgent messages can be sent to your provider as well.   To learn more about what you can do with MyChart, go to [email or web address].    Your next appointment:   1 week(s)  The format for your next appointment:   In Person  Provider:   Thomasene Ripple, DO   Other Instructions      Adopting a Healthy Lifestyle.  Know what a healthy weight is for you (roughly BMI <25) and aim to maintain this   Aim for 7+ servings of fruits and vegetables daily   65-80+ fluid ounces of water or unsweet tea for healthy kidneys   Limit to max 1 drink of alcohol per day; avoid smoking/tobacco   Limit animal fats in diet for cholesterol and heart health - choose grass fed whenever available   Avoid highly processed foods, and foods high in saturated/trans fats   Aim for low stress - take time to unwind and care for your mental health  Aim for 150 min of moderate intensity exercise weekly for heart health, and weights twice weekly for bone health   Aim for 7-9 hours of sleep daily   When it comes to diets, agreement about the perfect plan isnt easy to find, even among the experts. Experts at the [employer] developed an idea known as the Healthy Eating Plate. Just imagine a plate divided into logical,  healthy portions.   The emphasis is on diet quality:   Load up on vegetables and fruits - one-half of your plate: Aim for color and variety, and remember that potatoes dont count.   Go for whole grains - one-quarter of your plate: Whole wheat, barley, wheat berries, quinoa, oats, brown rice, and foods made with them. If you want pasta, go with whole wheat pasta.   Protein power - one-quarter of your plate: Fish, chicken, beans, and nuts are all healthy, versatile protein sources. Limit red meat.   The diet, however, does go beyond the plate, offering a few other suggestions.   Use healthy plant oils, such as olive, canola, soy, corn, sunflower and peanut. Check the labels, and avoid partially hydrogenated oil, which have unhealthy trans fats.   If youre thirsty, drink water. Coffee and tea are good in moderation, but skip sugary drinks and limit milk and dairy products to one or two daily servings.   The type of carbohydrate in the diet is more important than the amount. Some sources of carbohydrates, such as vegetables, fruits, whole grains, and beans-are healthier than others.   Finally, stay active  Signed, Thomasene RippleKardie Tobb, DO  04/03/2020 1:52 PM    [facility]

## 2020-04-04 LAB — BASIC METABOLIC PANEL
BUN/Creatinine Ratio: 19 (ref 12–28)
BUN: 29 mg/dL — ABNORMAL HIGH (ref 8–27)
CO2: 25 mmol/L (ref 20–29)
Calcium: 9.6 mg/dL (ref 8.7–10.3)
Chloride: 99 mmol/L (ref 96–106)
Creatinine, Ser: 1.56 mg/dL — ABNORMAL HIGH (ref 0.57–1.00)
GFR calc Af Amer: 41 mL/min/{1.73_m2} — ABNORMAL LOW (ref >59)
GFR calc non Af Amer: 36 mL/min/{1.73_m2} — ABNORMAL LOW (ref >59)
Glucose: 137 mg/dL — ABNORMAL HIGH (ref 65–99)
Potassium: 3.2 mmol/L — ABNORMAL LOW (ref 3.5–5.2)
Sodium: 144 mmol/L (ref 134–144)

## 2020-04-04 LAB — PRO B NATRIURETIC PEPTIDE: NT-Pro BNP: 8165 pg/mL — ABNORMAL HIGH (ref 0–287)

## 2020-04-04 LAB — MAGNESIUM: Magnesium: 1.8 mg/dL (ref 1.6–2.3)

## 2020-04-08 ENCOUNTER — Other Ambulatory Visit: Payer: Self-pay

## 2020-04-08 ENCOUNTER — Ambulatory Visit
Admission: RE | Admit: 2020-04-08 | Discharge: 2020-04-08 | Disposition: A | Discharge status: Home / Self Care | Payer: BC Managed Care – PPO | Source: Ambulatory Visit | Attending: Obstetrics and Gynecology | Admitting: Obstetrics and Gynecology

## 2020-04-08 DIAGNOSIS — Z1231 Encounter for screening mammogram for malignant neoplasm of breast: Secondary | ICD-10-CM | POA: Diagnosis not present

## 2020-04-11 ENCOUNTER — Other Ambulatory Visit: Payer: Self-pay

## 2020-04-11 ENCOUNTER — Ambulatory Visit: Payer: BC Managed Care – PPO | Admitting: Cardiology

## 2020-04-11 ENCOUNTER — Encounter: Payer: Self-pay | Admitting: Cardiology

## 2020-04-11 VITALS — BP 153/86 | HR 88 | Ht 68.0 in | Wt 344.0 lb

## 2020-04-11 DIAGNOSIS — I5033 Acute on chronic diastolic (congestive) heart failure: Secondary | ICD-10-CM

## 2020-04-11 DIAGNOSIS — I482 Chronic atrial fibrillation, unspecified: Secondary | ICD-10-CM

## 2020-04-11 DIAGNOSIS — E119 Type 2 diabetes mellitus without complications: Secondary | ICD-10-CM

## 2020-04-11 DIAGNOSIS — E782 Mixed hyperlipidemia: Secondary | ICD-10-CM | POA: Diagnosis not present

## 2020-04-11 DIAGNOSIS — I10 Essential (primary) hypertension: Secondary | ICD-10-CM

## 2020-04-11 DIAGNOSIS — Z794 Long term (current) use of insulin: Secondary | ICD-10-CM

## 2020-04-11 LAB — BASIC METABOLIC PANEL
BUN/Creatinine Ratio: 11 — ABNORMAL LOW (ref 12–28)
BUN: 15 mg/dL (ref 8–27)
CO2: 27 mmol/L (ref 20–29)
Calcium: 9.4 mg/dL (ref 8.7–10.3)
Chloride: 94 mmol/L — ABNORMAL LOW (ref 96–106)
Creatinine, Ser: 1.35 mg/dL — ABNORMAL HIGH (ref 0.57–1.00)
Glucose: 152 mg/dL — ABNORMAL HIGH (ref 65–99)
Potassium: 3 mmol/L — ABNORMAL LOW (ref 3.5–5.2)
Sodium: 138 mmol/L (ref 134–144)
eGFR: 45 mL/min/{1.73_m2} — ABNORMAL LOW (ref >59)

## 2020-04-11 LAB — MAGNESIUM: Magnesium: 1.5 mg/dL — ABNORMAL LOW (ref 1.6–2.3)

## 2020-04-11 MED ORDER — MAGNESIUM 400 MG PO TABS: 400.0000 mg | ORAL_TABLET | Freq: Two times a day (BID) | ORAL | 0 refills | Status: DC

## 2020-04-11 MED ORDER — METOLAZONE 5 MG PO TABS: 5.0000 mg | ORAL_TABLET | Freq: Every day | ORAL | 0 refills | Status: DC

## 2020-04-11 NOTE — Progress Notes (Signed)
Prescription for magnesium 400 mg sent into pharmacy.

## 2020-04-11 NOTE — Patient Instructions (Signed)
Medication Instructions:  Your physician has recommended you make the following change in your medication:  START: Metolazone 5 mg daily for days  *If you need a refill on your cardiac medications before your next appointment, please call your pharmacy*   Lab Work: Your physician recommends that you return for lab work: TODAY: BMET, [place]  If you have labs (blood work) drawn today and your tests are completely normal, you will receive your results only by: Marland Kitchen MyChart Message (if you have MyChart) OR . A paper copy in the mail If you have any lab test that is abnormal or we need to change your treatment, we will call you to review the results.   Testing/Procedures: None   Follow-Up: At [facility], you and your health needs are our priority.  As part of our continuing mission to provide you with exceptional heart care, we have created designated Provider Care Teams.  These Care Teams include your primary Cardiologist (physician) and Advanced Practice Providers (APPs -  Physician Assistants and Nurse Practitioners) who all work together to provide you with the care you need, when you need it.  We recommend signing up for the patient portal called "MyChart".  Sign up information is provided on this After Visit Summary.  MyChart is used to connect with patients for Virtual Visits (Telemedicine).  Patients are able to view lab/test results, encounter notes, upcoming appointments, etc.  Non-urgent messages can be sent to your provider as well.   To learn more about what you can do with MyChart, go to [email or web address].    Your next appointment:   4 week(s)  The format for your next appointment:   In Person  Provider:   Berniece Salines, DO   Other Instructions   Fat and Cholesterol Restricted Eating Plan Getting too much fat and cholesterol in your diet may cause health problems. Choosing the right foods helps keep your fat and cholesterol at normal levels. This can keep you from  getting certain diseases. What are tips for following this plan? Meal planning  At meals, divide your plate into four equal parts: ? Fill one-half of your plate with vegetables and green salads. ? Fill one-fourth of your plate with whole grains. ? Fill one-fourth of your plate with low-fat (lean) protein foods.  Eat fish that is high in omega-3 fats at least two times a week. This includes mackerel, tuna, sardines, and salmon.  Eat foods that are high in fiber, such as whole grains, beans, apples, broccoli, carrots, peas, and barley. General tips  Work with your doctor to lose weight if you need to.  Avoid: ? Foods with added sugar. ? Fried foods. ? Foods with partially hydrogenated oils.  Limit alcohol intake to no more than 1 drink a day for nonpregnant women and 2 drinks a day for men. One drink equals 12 oz of beer, 5 oz of wine, or 1 oz of hard liquor.   Reading food labels  Check food labels for: ? Trans fats. ? Partially hydrogenated oils. ? Saturated fat (g) in each serving. ? Cholesterol (mg) in each serving. ? Fiber (g) in each serving.  Choose foods with healthy fats, such as: ? Monounsaturated fats. ? Polyunsaturated fats. ? Omega-3 fats.  Choose grain products that have whole grains. Look for the word "whole" as the first word in the ingredient list. Cooking  Cook foods using low-fat methods. These include baking, boiling, grilling, and broiling.  Eat more home-cooked foods. Eat at restaurants and buffets  less often.  Avoid cooking using saturated fats, such as butter, cream, palm oil, palm kernel oil, and coconut oil. Recommended foods Fruits  All fresh, canned (in natural juice), or frozen fruits. Vegetables  Fresh or frozen vegetables (raw, steamed, roasted, or grilled). Green salads. Grains  Whole grains, such as whole wheat or whole grain breads, crackers, cereals, and pasta. Unsweetened oatmeal, bulgur, barley, quinoa, or brown rice. Corn or  whole wheat flour tortillas. Meats and other protein foods  Ground beef (85% or leaner), grass-fed beef, or beef trimmed of fat. Skinless chicken or [place]. Ground chicken or [place]. Pork trimmed of fat. All fish and seafood. Egg whites. Dried beans, peas, or lentils. Unsalted nuts or seeds. Unsalted canned beans. Nut butters without added sugar or oil. Dairy  Low-fat or nonfat dairy products, such as skim or 1% milk, 2% or reduced-fat cheeses, low-fat and fat-free ricotta or cottage cheese, or plain low-fat and nonfat yogurt. Fats and oils  Tub margarine without trans fats. Light or reduced-fat mayonnaise and salad dressings. Avocado. Olive, canola, sesame, or safflower oils. The items listed above may not be a complete list of foods and beverages you can eat. Contact a dietitian for more information.   Foods to avoid Fruits  Canned fruit in heavy syrup. Fruit in cream or butter sauce. Fried fruit. Vegetables  Vegetables cooked in cheese, cream, or butter sauce. Fried vegetables. Grains  White bread. White pasta. White rice. Cornbread. Bagels, pastries, and croissants. Crackers and snack foods that contain trans fat and hydrogenated oils. Meats and other protein foods  Fatty cuts of meat. Ribs, chicken wings, bacon, sausage, bologna, salami, chitterlings, fatback, hot dogs, bratwurst, and packaged lunch meats. Liver and organ meats. Whole eggs and egg yolks. Chicken and [place] with skin. Fried meat. Dairy  Whole or 2% milk, cream, half-and-half, and cream cheese. Whole milk cheeses. Whole-fat or sweetened yogurt. Full-fat cheeses. Nondairy creamers and whipped toppings. Processed cheese, cheese spreads, and cheese curds. Beverages  Alcohol. Sugar-sweetened drinks such as sodas, lemonade, and fruit drinks. Fats and oils  Butter, stick margarine, lard, shortening, ghee, or bacon fat. Coconut, palm kernel, and palm oils. Sweets and desserts  Corn syrup, sugars, honey, and molasses.  Candy. Jam and jelly. Syrup. Sweetened cereals. Cookies, pies, cakes, donuts, muffins, and ice cream. The items listed above may not be a complete list of foods and beverages you should avoid. Contact a dietitian for more information. Summary  Choosing the right foods helps keep your fat and cholesterol at normal levels. This can keep you from getting certain diseases.  At meals, fill one-half of your plate with vegetables and green salads.  Eat high-fiber foods, like whole grains, beans, apples, carrots, peas, and barley.  Limit added sugar, saturated fats, alcohol, and fried foods. This information is not intended to replace advice given to you by your health care provider. Make sure you discuss any questions you have with your health care provider. Document Revised: 05/30/2019 Document Reviewed: 05/30/2019 Elsevier Patient Education  2021 Elsevier Inc.  Healthy Eating Following a healthy eating pattern may help you to achieve and maintain a healthy body weight, reduce the risk of chronic disease, and live a long and productive life. It is important to follow a healthy eating pattern at an appropriate calorie level for your body. Your nutritional needs should be met primarily through food by choosing a variety of nutrient-rich foods. What are tips for following this plan? Reading food labels  Read labels and choose the  following: ? Reduced or low sodium. ? Juices with 100% fruit juice. ? Foods with low saturated fats and high polyunsaturated and monounsaturated fats. ? Foods with whole grains, such as whole wheat, cracked wheat, brown rice, and wild rice. ? Whole grains that are fortified with folic acid. This is recommended for women who are pregnant or who want to become pregnant.  Read labels and avoid the following: ? Foods with a lot of added sugars. These include foods that contain brown sugar, corn sweetener, corn syrup, dextrose, fructose, glucose, high-fructose corn syrup,  honey, invert sugar, lactose, malt syrup, maltose, molasses, raw sugar, sucrose, trehalose, or turbinado sugar.  Do not eat more than the following amounts of added sugar per day:  6 teaspoons (25 g) for women.  9 teaspoons (38 g) for men. ? Foods that contain processed or refined starches and grains. ? Refined grain products, such as white flour, degermed cornmeal, white bread, and white rice. Shopping  Choose nutrient-rich snacks, such as vegetables, whole fruits, and nuts. Avoid high-calorie and high-sugar snacks, such as potato chips, fruit snacks, and candy.  Use oil-based dressings and spreads on foods instead of solid fats such as butter, stick margarine, or cream cheese.  Limit pre-made sauces, mixes, and "instant" products such as flavored rice, instant noodles, and ready-made pasta.  Try more plant-protein sources, such as tofu, tempeh, black beans, edamame, lentils, nuts, and seeds.  Explore eating plans such as the Mediterranean diet or vegetarian diet. Cooking  Use oil to saut or stir-fry foods instead of solid fats such as butter, stick margarine, or lard.  Try baking, boiling, grilling, or broiling instead of frying.  Remove the fatty part of meats before cooking.  Steam vegetables in water or broth. Meal planning  At meals, imagine dividing your plate into fourths: ? One-half of your plate is fruits and vegetables. ? One-fourth of your plate is whole grains. ? One-fourth of your plate is protein, especially lean meats, poultry, eggs, tofu, beans, or nuts.  Include low-fat dairy as part of your daily diet.   Lifestyle  Choose healthy options in all settings, including home, work, school, restaurants, or stores.  Prepare your food safely: ? Wash your hands after handling raw meats. ? Keep food preparation surfaces clean by regularly washing with hot, soapy water. ? Keep raw meats separate from ready-to-eat foods, such as fruits and vegetables. ? Cook  seafood, meat, poultry, and eggs to the recommended internal temperature. ? Store foods at safe temperatures. In general:  Keep cold foods at 51F (4.4C) or below.  Keep hot foods at 151F (60C) or above.  Keep your freezer at [facility] (-17.8C) or below.  Foods are no longer safe to eat when they have been between the temperatures of 40-151F (4.4-60C) for more than 2 hours. What foods should I eat? Fruits Aim to eat 2 cup-equivalents of fresh, canned (in natural juice), or frozen fruits each day. Examples of 1 cup-equivalent of fruit include 1 small apple, 8 large strawberries, 1 cup canned fruit,  cup dried fruit, or 1 cup 100% juice. Vegetables Aim to eat 2-3 cup-equivalents of fresh and frozen vegetables each day, including different varieties and colors. Examples of 1 cup-equivalent of vegetables include 2 medium carrots, 2 cups raw, leafy greens, 1 cup chopped vegetable (raw or cooked), or 1 medium baked potato. Grains Aim to eat 6 ounce-equivalents of whole grains each day. Examples of 1 ounce-equivalent of grains include 1 slice of bread, 1 cup ready-to-eat cereal,  3 cups popcorn, or  cup cooked rice, pasta, or cereal. Meats and other proteins Aim to eat 5-6 ounce-equivalents of protein each day. Examples of 1 ounce-equivalent of protein include 1 egg, 1/2 cup nuts or seeds, or 1 tablespoon (16 g) peanut butter. A cut of meat or fish that is the size of a deck of cards is about 3-4 ounce-equivalents.  Of the protein you eat each week, try to have at least 8 ounces come from seafood. This includes salmon, trout, herring, and anchovies. Dairy Aim to eat 3 cup-equivalents of fat-free or low-fat dairy each day. Examples of 1 cup-equivalent of dairy include 1 cup (240 mL) milk, 8 ounces (250 g) yogurt, 1 ounces (44 g) natural cheese, or 1 cup (240 mL) fortified soy milk. Fats and oils  Aim for about 5 teaspoons (21 g) per day. Choose monounsaturated fats, such as canola and olive  oils, avocados, peanut butter, and most nuts, or polyunsaturated fats, such as sunflower, corn, and soybean oils, walnuts, pine nuts, sesame seeds, sunflower seeds, and flaxseed. Beverages  Aim for six 8-oz glasses of water per day. Limit coffee to three to five 8-oz cups per day.  Limit caffeinated beverages that have added calories, such as soda and energy drinks.  Limit alcohol intake to no more than 1 drink a day for nonpregnant women and 2 drinks a day for men. One drink equals 12 oz of beer (355 mL), 5 oz of wine (148 mL), or 1 oz of hard liquor (44 mL). Seasoning and other foods  Avoid adding excess amounts of salt to your foods. Try flavoring foods with herbs and spices instead of salt.  Avoid adding sugar to foods.  Try using oil-based dressings, sauces, and spreads instead of solid fats. This information is based on general [place] nutrition guidelines. For more information, visit BuildDNA.es. Exact amounts may vary based on your nutrition needs. Summary  A healthy eating plan may help you to maintain a healthy weight, reduce the risk of chronic diseases, and stay active throughout your life.  Plan your meals. Make sure you eat the right portions of a variety of nutrient-rich foods.  Try baking, boiling, grilling, or broiling instead of frying.  Choose healthy options in all settings, including home, work, school, restaurants, or stores. This information is not intended to replace advice given to you by your health care provider. Make sure you discuss any questions you have with your health care provider. Document Revised: 05/09/2017 Document Reviewed: 05/09/2017 Elsevier Patient Education  2021 [place].  Diabetes Mellitus and Nutrition, Adult When you have diabetes, or diabetes mellitus, it is very important to have healthy eating habits because your blood sugar (glucose) levels are greatly affected by what you eat and drink. Eating healthy foods in the right  amounts, at about the same times every day, can help you:  Control your blood glucose.  Lower your risk of heart disease.  Improve your blood pressure.  Reach or maintain a healthy weight. What can affect my meal plan? Every person with diabetes is different, and each person has different needs for a meal plan. Your health care provider may recommend that you work with a dietitian to make a meal plan that is best for you. Your meal plan may vary depending on factors such as:  The calories you need.  The medicines you take.  Your weight.  Your blood glucose, blood pressure, and cholesterol levels.  Your activity level.  Other health conditions you have,  such as heart or kidney disease. How do carbohydrates affect me? Carbohydrates, also called carbs, affect your blood glucose level more than any other type of food. Eating carbs naturally raises the amount of glucose in your blood. Carb counting is a method for keeping track of how many carbs you eat. Counting carbs is important to keep your blood glucose at a healthy level, especially if you use insulin or take certain oral diabetes medicines. It is important to know how many carbs you can safely have in each meal. This is different for every person. Your dietitian can help you calculate how many carbs you should have at each meal and for each snack. How does alcohol affect me? Alcohol can cause a sudden decrease in blood glucose (hypoglycemia), especially if you use insulin or take certain oral diabetes medicines. Hypoglycemia can be a life-threatening condition. Symptoms of hypoglycemia, such as sleepiness, dizziness, and confusion, are similar to symptoms of having too much alcohol.  Do not drink alcohol if: ? Your health care provider tells you not to drink. ? You are pregnant, may be pregnant, or are planning to become pregnant.  If you drink alcohol: ? Do not drink on an empty stomach. ? Limit how much you use to:  0-1 drink  a day for women.  0-2 drinks a day for men. ? Be aware of how much alcohol is in your drink. In the [place] one drink equals one 12 oz bottle of beer (355 mL), one 5 oz glass of wine (148 mL), or one 1 oz glass of hard liquor (44 mL). ? Keep yourself hydrated with water, diet soda, or unsweetened iced tea.  Keep in mind that regular soda, juice, and other mixers may contain a lot of sugar and must be counted as carbs. What are tips for following this plan? Reading food labels  Start by checking the serving size on the "Nutrition Facts" label of packaged foods and drinks. The amount of calories, carbs, fats, and other nutrients listed on the label is based on one serving of the item. Many items contain more than one serving per package.  Check the total grams (g) of carbs in one serving. You can calculate the number of servings of carbs in one serving by dividing the total carbs by 15. For example, if a food has 30 g of total carbs per serving, it would be equal to 2 servings of carbs.  Check the number of grams (g) of saturated fats and trans fats in one serving. Choose foods that have a low amount or none of these fats.  Check the number of milligrams (mg) of salt (sodium) in one serving. Most people should limit total sodium intake to less than 2,300 mg per day.  Always check the nutrition information of foods labeled as "low-fat" or "nonfat." These foods may be higher in added sugar or refined carbs and should be avoided.  Talk to your dietitian to identify your daily goals for nutrients listed on the label. Shopping  Avoid buying canned, pre-made, or processed foods. These foods tend to be high in fat, sodium, and added sugar.  Shop around the outside edge of the grocery store. This is where you will most often find fresh fruits and vegetables, bulk grains, fresh meats, and fresh dairy. Cooking  Use low-heat cooking methods, such as baking, instead of high-heat cooking methods like  deep frying.  Cook using healthy oils, such as olive, canola, or sunflower oil.  Avoid cooking with  butter, cream, or high-fat meats. Meal planning  Eat meals and snacks regularly, preferably at the same times every day. Avoid going long periods of time without eating.  Eat foods that are high in fiber, such as fresh fruits, vegetables, beans, and whole grains. Talk with your dietitian about how many servings of carbs you can eat at each meal.  Eat 4-6 oz (112-168 g) of lean protein each day, such as lean meat, chicken, fish, eggs, or tofu. One ounce (oz) of lean protein is equal to: ? 1 oz (28 g) of meat, chicken, or fish. ? 1 egg. ?  cup (62 g) of tofu.  Eat some foods each day that contain healthy fats, such as avocado, nuts, seeds, and fish.   What foods should I eat? Fruits Berries. Apples. Oranges. Peaches. Apricots. Plums. Grapes. Mango. Papaya. Pomegranate. Kiwi. Cherries. Vegetables Lettuce. Spinach. Leafy greens, including kale, chard, collard greens, and mustard greens. Beets. Cauliflower. Cabbage. Broccoli. Carrots. Green beans. Tomatoes. Peppers. Onions. Cucumbers. Brussels sprouts. Grains Whole grains, such as whole-wheat or whole-grain bread, crackers, tortillas, cereal, and pasta. Unsweetened oatmeal. Quinoa. Brown or wild rice. Meats and other proteins Seafood. Poultry without skin. Lean cuts of poultry and beef. Tofu. Nuts. Seeds. Dairy Low-fat or fat-free dairy products such as milk, yogurt, and cheese. The items listed above may not be a complete list of foods and beverages you can eat. Contact a dietitian for more information. What foods should I avoid? Fruits Fruits canned with syrup. Vegetables Canned vegetables. Frozen vegetables with butter or cream sauce. Grains Refined white flour and flour products such as bread, pasta, snack foods, and cereals. Avoid all processed foods. Meats and other proteins Fatty cuts of meat. Poultry with skin. Breaded or fried  meats. Processed meat. Avoid saturated fats. Dairy Full-fat yogurt, cheese, or milk. Beverages Sweetened drinks, such as soda or iced tea. The items listed above may not be a complete list of foods and beverages you should avoid. Contact a dietitian for more information. Questions to ask a health care provider  Do I need to meet with a diabetes educator?  Do I need to meet with a dietitian?  What number can I call if I have questions?  When are the best times to check my blood glucose? Where to find more information:  American Diabetes Association: diabetes.org  Academy of Nutrition and Dietetics: www.eatright.[employer] of Diabetes and Digestive and Kidney Diseases: [employer].dk  Association of Diabetes Care and Education Specialists: www.diabeteseducator.org Summary  It is important to have healthy eating habits because your blood sugar (glucose) levels are greatly affected by what you eat and drink.  A healthy meal plan will help you control your blood glucose and maintain a healthy lifestyle.  Your health care provider may recommend that you work with a dietitian to make a meal plan that is best for you.  Keep in mind that carbohydrates (carbs) and alcohol have immediate effects on your blood glucose levels. It is important to count carbs and to use alcohol carefully. This information is not intended to replace advice given to you by your health care provider. Make sure you discuss any questions you have with your health care provider. Document Revised: 01/02/2019 Document Reviewed: 01/02/2019 Elsevier Patient Education  2021 [place] refers to food and lifestyle choices that are based on the traditions of countries located on the [employer]. This way of eating has been shown to help prevent certain  conditions and improve outcomes for people who have chronic diseases, like kidney disease and heart  disease. What are tips for following this plan? Lifestyle  Cook and eat meals together with your family, when possible.  Drink enough fluid to keep your urine clear or pale yellow.  Be physically active every day. This includes: ? Aerobic exercise like running or swimming. ? Leisure activities like gardening, walking, or housework.  Get 7-8 hours of sleep each night.  If recommended by your health care provider, drink red wine in moderation. This means 1 glass a day for nonpregnant women and 2 glasses a day for men. A glass of wine equals 5 oz (150 mL). Reading food labels  Check the serving size of packaged foods. For foods such as rice and pasta, the serving size refers to the amount of cooked product, not dry.  Check the total fat in packaged foods. Avoid foods that have saturated fat or trans fats.  Check the ingredients list for added sugars, such as corn syrup.   Shopping  At the grocery store, buy most of your food from the areas near the walls of the store. This includes: ? Fresh fruits and vegetables (produce). ? Grains, beans, nuts, and seeds. Some of these may be available in unpackaged forms or large amounts (in bulk). ? Fresh seafood. ? Poultry and eggs. ? Low-fat dairy products.  Buy whole ingredients instead of prepackaged foods.  Buy fresh fruits and vegetables in-season from local farmers markets.  Buy frozen fruits and vegetables in resealable bags.  If you do not have access to quality fresh seafood, buy precooked frozen shrimp or canned fish, such as tuna, salmon, or sardines.  Buy small amounts of raw or cooked vegetables, salads, or olives from the deli or salad bar at your store.  Stock your pantry so you always have certain foods on hand, such as olive oil, canned tuna, canned tomatoes, rice, pasta, and beans. Cooking  Cook foods with extra-virgin olive oil instead of using butter or other vegetable oils.  Have meat as a side dish, and have  vegetables or grains as your main dish. This means having meat in small portions or adding small amounts of meat to foods like pasta or stew.  Use beans or vegetables instead of meat in common dishes like chili or lasagna.  Experiment with different cooking methods. Try roasting or broiling vegetables instead of steaming or sauteing them.  Add frozen vegetables to soups, stews, pasta, or rice.  Add nuts or seeds for added healthy fat at each meal. You can add these to yogurt, salads, or vegetable dishes.  Marinate fish or vegetables using olive oil, lemon juice, garlic, and fresh herbs. Meal planning  Plan to eat 1 vegetarian meal one day each week. Try to work up to 2 vegetarian meals, if possible.  Eat seafood 2 or more times a week.  Have healthy snacks readily available, such as: ? Vegetable sticks with hummus. ? [place] yogurt. ? Fruit and nut trail mix.  Eat balanced meals throughout the week. This includes: ? Fruit: 2-3 servings a day ? Vegetables: 4-5 servings a day ? Low-fat dairy: 2 servings a day ? Fish, poultry, or lean meat: 1 serving a day ? Beans and legumes: 2 or more servings a week ? Nuts and seeds: 1-2 servings a day ? Whole grains: 6-8 servings a day ? Extra-virgin olive oil: 3-4 servings a day  Limit red meat and sweets to only a few servings  a month   What are my food choices?  Mediterranean diet ? Recommended  Grains: Whole-grain pasta. Brown rice. Bulgar wheat. Polenta. Couscous. Whole-wheat bread. [place].  Vegetables: Artichokes. Beets. Broccoli. Cabbage. Carrots. Eggplant. Green beans. Chard. Kale. Spinach. Onions. Leeks. Peas. Squash. Tomatoes. Peppers. Radishes.  Fruits: Apples. Apricots. Avocado. Berries. Bananas. Cherries. Dates. Figs. Grapes. Lemons. Melon. Oranges. Peaches. Plums. Pomegranate.  Meats and other protein foods: Beans. Almonds. Sunflower seeds. Pine nuts. Peanuts. [place]. Salmon. Scallops. Shrimp. [place]. Tilapia. Clams.  Oysters. Eggs.  Dairy: Low-fat milk. Cheese. Greek yogurt.  Beverages: Water. Red wine. Herbal tea.  Fats and oils: Extra virgin olive oil. Avocado oil. Grape seed oil.  Sweets and desserts: [place] yogurt with honey. Baked apples. Poached pears. Trail mix.  Seasoning and other foods: Basil. Cilantro. Coriander. Cumin. Mint. Parsley. Sage. Rosemary. Tarragon. Garlic. Oregano. Thyme. Pepper. Balsalmic vinegar. Tahini. Hummus. Tomato sauce. Olives. Mushrooms. ? Limit these  Grains: Prepackaged pasta or rice dishes. Prepackaged cereal with added sugar.  Vegetables: Deep fried potatoes (french fries).  Fruits: Fruit canned in syrup.  Meats and other protein foods: Beef. Pork. Lamb. Poultry with skin. Hot dogs. [place] Salines.  Dairy: Ice cream. Sour cream. Whole milk.  Beverages: Juice. Sugar-sweetened soft drinks. Beer. Liquor and spirits.  Fats and oils: Butter. Canola oil. Vegetable oil. Beef fat (tallow). Lard.  Sweets and desserts: Cookies. Cakes. Pies. Candy.  Seasoning and other foods: Mayonnaise. Premade sauces and marinades. The items listed may not be a complete list. Talk with your dietitian about what dietary choices are right for you. Summary  The Mediterranean diet includes both food and lifestyle choices.  Eat a variety of fresh fruits and vegetables, beans, nuts, seeds, and whole grains.  Limit the amount of red meat and sweets that you eat.  Talk with your health care provider about whether it is safe for you to drink red wine in moderation. This means 1 glass a day for nonpregnant women and 2 glasses a day for men. A glass of wine equals 5 oz (150 mL). This information is not intended to replace advice given to you by your health care provider. Make sure you discuss any questions you have with your health care provider. Document Revised: 09/25/2015 Document Reviewed: 09/18/2015 Elsevier Patient Education  [place].

## 2020-04-11 NOTE — Progress Notes (Signed)
Cardiology Office Note:    Date:  04/11/2020   ID:  Suzanne Carey, DOB [birth year], MRN [id number]  PCP:  Bonnita Nasuti, MD  Cardiologist:  Berniece Salines, DO  Electrophysiologist:  None   Referring MD: Bonnita Nasuti, MD   Chief Complaint  Patient presents with  . Follow-up    History of Present Illness:    Suzanne Carey is a 60 y.o. female with a hx of atrial fibrillation on Eliquis, hypertension, hyperlipidemia, morbid obesity, diabetes is here today for follow-up visit.  I saw the patient in July 2021 at that time she was in atrial fibrillation but the patient declined DC cardioversion. We will continue her on metoprolol and Eliquis.  I saw the patient on February 09, 2020 we talked about cardioversion which she also did decline.  We also discussed weight loss and I referred the patient to our healthy wellness program.  At her last visit she did have significant volume overload as well as increasing heart rate.  I recommended patient go to the emergency department to be treated for IV Lasix and she declined.  So I started her on Lasix 40 mg twice a day and gave her metolazone 2.5 mg for 3 days.  We had some blood work which showed increased BNP.  Also loaded the patient with digoxin  She is here today she is lost some weight with some improvement.  She still does have some volume overload.  Past Medical History:  Diagnosis Date  . Anxiety disorder   . Atrial fibrillation ([place]) 03/28/2019  . Chronic atrial fibrillation, unspecified ([place]) 03/28/2019  . Depressed left ventricular ejection fraction 03/28/2019  . Diabetes mellitus ([place]) 03/28/2019  . Dietary counseling and surveillance 03/28/2019  . Essential hypertension   . Gastro-esophageal reflux disease without esophagitis   . Generalized anxiety disorder   . Hypercholesterolemia 03/17/2020  . Hyperglycemia due to type 2 diabetes mellitus ([place])   . Hypertensive disorder 03/17/2020  . Hypothyroidism   . Mixed  hyperlipidemia   . Morbid obesity ([place]) 03/27/2019  . Non-toxic multinodular goiter   . Nontoxic multinodular goiter   . Occlusion and stenosis of bilateral carotid arteries 03/31/2020  . Other long term (current) drug therapy 02/15/2020  . Other specified hypothyroidism   . Other vitamin B12 deficiency anemias   . Plantar fasciitis 12/17/2019  . Polyp at cervical os 03/27/2019  . Primary generalized (osteo)arthritis   . Pulmonary hypertension ([place]) 03/28/2019  . Shortness of breath 04/02/2020  . Tightness of heel cord, left 12/17/2019  . Type 2 diabetes mellitus with hyperglycemia ([place])   . Type 2 diabetes mellitus without complication, with long-term current use of insulin ([place]) 03/28/2019  . Unstable angina ([place])   . Vitamin D deficiency     Past Surgical History:  Procedure Laterality Date  . CESAREAN SECTION  2002  . CHOLECYSTECTOMY  1994    Current Medications: Current Meds  Medication Sig  . ALPRAZolam (XANAX) 0.5 MG tablet Take 0.5 mg by mouth 3 (three) times daily as needed.   . digoxin (LANOXIN) 0.125 MG tablet Take 1 tablet (0.125 mg total) by mouth daily.  Marland Kitchen ELIQUIS 5 MG TABS tablet TAKE 1 TABLET BY MOUTH TWICE A DAY  . ergocalciferol (VITAMIN D2) 1.25 MG (50000 UT) capsule ergocalciferol (vitamin D2) 1,250 mcg (50,000 unit) capsule  TAKE 1 CAPSULE BY MOUTH TWICE WEEKLY  . furosemide (LASIX) 40 MG tablet Take 1 tablet (40 mg total) by mouth 2 (two) times daily.  Marland Kitchen  hydrochlorothiazide (HYDRODIURIL) 25 MG tablet TAKE 1 TABLET BY MOUTH EVERY DAY  . liraglutide (VICTOZA) 18 MG/3ML SOPN Victoza 3-Pak 0.6 mg/0.1 mL (18 mg/3 mL) subcutaneous pen injector  INJECT 1.8 MG ONCE A DAY SUBCUTANEOUSLY 30 DAYS  . metolazone (ZAROXOLYN) 5 MG tablet Take 1 tablet (5 mg total) by mouth daily.  . metoprolol succinate (TOPROL-XL) 25 MG 24 hr tablet TAKE 1 TABLET BY MOUTH EVERY DAY  . nitroGLYCERIN (NITROSTAT) 0.4 MG SL tablet Place under the tongue.  Marland Kitchen olmesartan (BENICAR) 20 MG tablet Take 1  tablet (20 mg total) by mouth daily.  Marland Kitchen omeprazole (PRILOSEC) 40 MG capsule 40 mg daily.  . pioglitazone (ACTOS) 30 MG tablet Take 30 mg by mouth daily.  . potassium chloride SA (KLOR-CON M20) 20 MEQ tablet Take 1 tablet (20 mEq total) by mouth daily.     Allergies:   Dapagliflozin, Lipitor [atorvastatin], and Latex   Social History   Socioeconomic History  . Marital status: Married    Spouse name: Not on file  . Number of children: Not on file  . Years of education: Not on file  . Highest education level: Not on file  Occupational History  . Not on file  Tobacco Use  . Smoking status: Never Smoker  . Smokeless tobacco: Never Used  Substance and Sexual Activity  . Alcohol use: Never  . Drug use: Never  . Sexual activity: Not on file  Other Topics Concern  . Not on file  Social History Narrative  . Not on file   Social Determinants of Health   Financial Resource Strain: Not on file  Food Insecurity: Not on file  Transportation Needs: Not on file  Physical Activity: Not on file  Stress: Not on file  Social Connections: Not on file     Family History: The patient's family history includes Breast cancer (age of onset: 59) in her cousin and paternal aunt; Cancer in her brother; Diabetes in her mother; Heart disease in her mother and sister; Hypertension in her mother and sister; Multiple sclerosis in her sister; Stroke in her mother.  ROS:   Review of Systems  Constitution: Negative for decreased appetite, fever and weight gain.  HENT: Negative for congestion, ear discharge, hoarse voice and sore throat.   Eyes: Negative for discharge, redness, vision loss in right eye and visual halos.  Cardiovascular: Negative for chest pain, dyspnea on exertion, leg swelling, orthopnea and palpitations.  Respiratory: Negative for cough, hemoptysis, shortness of breath and snoring.   Endocrine: Negative for heat intolerance and polyphagia.  Hematologic/Lymphatic: Negative for bleeding  problem. Does not bruise/bleed easily.  Skin: Negative for flushing, nail changes, rash and suspicious lesions.  Musculoskeletal: Negative for arthritis, joint pain, muscle cramps, myalgias, neck pain and stiffness.  Gastrointestinal: Negative for abdominal pain, bowel incontinence, diarrhea and excessive appetite.  Genitourinary: Negative for decreased libido, genital sores and incomplete emptying.  Neurological: Negative for brief paralysis, focal weakness, headaches and loss of balance.  Psychiatric/Behavioral: Negative for altered mental status, depression and suicidal ideas.  Allergic/Immunologic: Negative for HIV exposure and persistent infections.    EKGs/Labs/Other Studies Reviewed:    The following studies were reviewed today:   EKG: None today  Recent Labs: 04/03/2020: NT-Pro BNP 8,165 04/11/2020: BUN 15; Creatinine, Ser 1.35; Magnesium 1.5; Potassium 3.0; Sodium 138  Recent Lipid Panel    Component Value Date/Time   CHOL 186 03/28/2019 1136   TRIG 157 (H) 03/28/2019 1136   HDL 42 03/28/2019 1136  CHOLHDL 4.4 03/28/2019 1136   LDLCALC 116 (H) 03/28/2019 1136    Physical Exam:    VS:  BP (!) 153/86 (BP Location: Left Arm, Patient Position: Sitting, Cuff Size: Normal)   Pulse 88   Ht 5' 8"  (1.727 m)   Wt (!) 344 lb (156 kg)   SpO2 97%   BMI 52.31 kg/m     Wt Readings from Last 3 Encounters:  04/11/20 (!) 344 lb (156 kg)  04/03/20 (!) 360 lb 6.4 oz (163.5 kg)  02/15/20 (!) 340 lb (154.2 kg)     GEN: Well nourished, well developed in no acute distress HEENT: Normal NECK: No JVD; No carotid bruits LYMPHATICS: No lymphadenopathy CARDIAC: S1S2 noted,RRR, no murmurs, rubs, gallops RESPIRATORY:  Clear to auscultation without rales, wheezing or rhonchi  ABDOMEN: Soft, non-tender, non-distended, +bowel sounds, no guarding. EXTREMITIES: +3 bilateral leg edema, No cyanosis, no clubbing MUSCULOSKELETAL:  No deformity  SKIN: Warm and dry NEUROLOGIC:  Alert and  oriented x 3, non-focal PSYCHIATRIC:  Normal affect, good insight  ASSESSMENT:    1. Acute on chronic diastolic heart failure ([place])   2. Primary hypertension   3. Chronic atrial fibrillation, unspecified ([place])   4. Mixed hyperlipidemia   5. Morbid obesity ([place])   6. Type 2 diabetes mellitus without complication, with long-term current use of insulin ([place])    PLAN:     1.  Her bilateral leg edema has improved some but still needs more diuretics.  I offered again that the patient needed to go to the emergency department for IV Lasix and she declined.  So what ended up doing was giving her metolazone 5 mg for 7 days 30 minutes before her first Lasix dose.  She will continue her Lasix 40 mg twice a day with potassium supplement.  She has actually lost 20 pounds since the last time I saw her.  Her heart rate has improved greatly on the digoxin.  We will continue to monitor patient.  Plan to see her in 4 weeks  Blood work will be done today to assess kidney function and electrolytes.  The patient is in agreement with the above plan. The patient left the office in stable condition.  The patient will follow up in   Medication Adjustments/Labs and Tests Ordered: Current medicines are reviewed at length with the patient today.  Concerns regarding medicines are outlined above.  Orders Placed This Encounter  Procedures  . Basic metabolic panel  . Magnesium   Meds ordered this encounter  Medications  . metolazone (ZAROXOLYN) 5 MG tablet    Sig: Take 1 tablet (5 mg total) by mouth daily.    Dispense:  7 tablet    Refill:  0    Patient Instructions   Medication Instructions:  Your physician has recommended you make the following change in your medication:  START: Metolazone 5 mg daily for days  *If you need a refill on your cardiac medications before your next appointment, please call your pharmacy*   Lab Work: Your physician recommends that you return for lab work: TODAY: BMET,  [place]  If you have labs (blood work) drawn today and your tests are completely normal, you will receive your results only by: Marland Kitchen MyChart Message (if you have MyChart) OR . A paper copy in the mail If you have any lab test that is abnormal or we need to change your treatment, we will call you to review the results.   Testing/Procedures: None   Follow-Up: At  [facility], you and your health needs are our priority.  As part of our continuing mission to provide you with exceptional heart care, we have created designated Provider Care Teams.  These Care Teams include your primary Cardiologist (physician) and Advanced Practice Providers (APPs -  Physician Assistants and Nurse Practitioners) who all work together to provide you with the care you need, when you need it.  We recommend signing up for the patient portal called "MyChart".  Sign up information is provided on this After Visit Summary.  MyChart is used to connect with patients for Virtual Visits (Telemedicine).  Patients are able to view lab/test results, encounter notes, upcoming appointments, etc.  Non-urgent messages can be sent to your provider as well.   To learn more about what you can do with MyChart, go to [email or web address].    Your next appointment:   4 week(s)  The format for your next appointment:   In Person  Provider:   Berniece Salines, DO   Other Instructions   Fat and Cholesterol Restricted Eating Plan Getting too much fat and cholesterol in your diet may cause health problems. Choosing the right foods helps keep your fat and cholesterol at normal levels. This can keep you from getting certain diseases. What are tips for following this plan? Meal planning  At meals, divide your plate into four equal parts: ? Fill one-half of your plate with vegetables and green salads. ? Fill one-fourth of your plate with whole grains. ? Fill one-fourth of your plate with low-fat (lean) protein foods.  Eat fish that is high  in omega-3 fats at least two times a week. This includes mackerel, tuna, sardines, and salmon.  Eat foods that are high in fiber, such as whole grains, beans, apples, broccoli, carrots, peas, and barley. General tips  Work with your doctor to lose weight if you need to.  Avoid: ? Foods with added sugar. ? Fried foods. ? Foods with partially hydrogenated oils.  Limit alcohol intake to no more than 1 drink a day for nonpregnant women and 2 drinks a day for men. One drink equals 12 oz of beer, 5 oz of wine, or 1 oz of hard liquor.   Reading food labels  Check food labels for: ? Trans fats. ? Partially hydrogenated oils. ? Saturated fat (g) in each serving. ? Cholesterol (mg) in each serving. ? Fiber (g) in each serving.  Choose foods with healthy fats, such as: ? Monounsaturated fats. ? Polyunsaturated fats. ? Omega-3 fats.  Choose grain products that have whole grains. Look for the word "whole" as the first word in the ingredient list. Cooking  Cook foods using low-fat methods. These include baking, boiling, grilling, and broiling.  Eat more home-cooked foods. Eat at restaurants and buffets less often.  Avoid cooking using saturated fats, such as butter, cream, palm oil, palm kernel oil, and coconut oil. Recommended foods Fruits  All fresh, canned (in natural juice), or frozen fruits. Vegetables  Fresh or frozen vegetables (raw, steamed, roasted, or grilled). Green salads. Grains  Whole grains, such as whole wheat or whole grain breads, crackers, cereals, and pasta. Unsweetened oatmeal, bulgur, barley, quinoa, or brown rice. Corn or whole wheat flour tortillas. Meats and other protein foods  Ground beef (85% or leaner), grass-fed beef, or beef trimmed of fat. Skinless chicken or [place]. Ground chicken or [place]. Pork trimmed of fat. All fish and seafood. Egg whites. Dried beans, peas, or lentils. Unsalted nuts or seeds. Unsalted canned beans. Nut butters  without added  sugar or oil. Dairy  Low-fat or nonfat dairy products, such as skim or 1% milk, 2% or reduced-fat cheeses, low-fat and fat-free ricotta or cottage cheese, or plain low-fat and nonfat yogurt. Fats and oils  Tub margarine without trans fats. Light or reduced-fat mayonnaise and salad dressings. Avocado. Olive, canola, sesame, or safflower oils. The items listed above may not be a complete list of foods and beverages you can eat. Contact a dietitian for more information.   Foods to avoid Fruits  Canned fruit in heavy syrup. Fruit in cream or butter sauce. Fried fruit. Vegetables  Vegetables cooked in cheese, cream, or butter sauce. Fried vegetables. Grains  White bread. White pasta. White rice. Cornbread. Bagels, pastries, and croissants. Crackers and snack foods that contain trans fat and hydrogenated oils. Meats and other protein foods  Fatty cuts of meat. Ribs, chicken wings, bacon, sausage, bologna, salami, chitterlings, fatback, hot dogs, bratwurst, and packaged lunch meats. Liver and organ meats. Whole eggs and egg yolks. Chicken and [place] with skin. Fried meat. Dairy  Whole or 2% milk, cream, half-and-half, and cream cheese. Whole milk cheeses. Whole-fat or sweetened yogurt. Full-fat cheeses. Nondairy creamers and whipped toppings. Processed cheese, cheese spreads, and cheese curds. Beverages  Alcohol. Sugar-sweetened drinks such as sodas, lemonade, and fruit drinks. Fats and oils  Butter, stick margarine, lard, shortening, ghee, or bacon fat. Coconut, palm kernel, and palm oils. Sweets and desserts  Corn syrup, sugars, honey, and molasses. Candy. Jam and jelly. Syrup. Sweetened cereals. Cookies, pies, cakes, donuts, muffins, and ice cream. The items listed above may not be a complete list of foods and beverages you should avoid. Contact a dietitian for more information. Summary  Choosing the right foods helps keep your fat and cholesterol at normal levels. This can keep you  from getting certain diseases.  At meals, fill one-half of your plate with vegetables and green salads.  Eat high-fiber foods, like whole grains, beans, apples, carrots, peas, and barley.  Limit added sugar, saturated fats, alcohol, and fried foods. This information is not intended to replace advice given to you by your health care provider. Make sure you discuss any questions you have with your health care provider. Document Revised: 05/30/2019 Document Reviewed: 05/30/2019 Elsevier Patient Education  2021 [place] Following a healthy eating pattern may help you to achieve and maintain a healthy body weight, reduce the risk of chronic disease, and live a long and productive life. It is important to follow a healthy eating pattern at an appropriate calorie level for your body. Your nutritional needs should be met primarily through food by choosing a variety of nutrient-rich foods. What are tips for following this plan? Reading food labels  Read labels and choose the following: ? Reduced or low sodium. ? Juices with 100% fruit juice. ? Foods with low saturated fats and high polyunsaturated and monounsaturated fats. ? Foods with whole grains, such as whole wheat, cracked wheat, brown rice, and wild rice. ? Whole grains that are fortified with folic acid. This is recommended for women who are pregnant or who want to become pregnant.  Read labels and avoid the following: ? Foods with a lot of added sugars. These include foods that contain brown sugar, corn sweetener, corn syrup, dextrose, fructose, glucose, high-fructose corn syrup, honey, invert sugar, lactose, malt syrup, maltose, molasses, raw sugar, sucrose, trehalose, or turbinado sugar.  Do not eat more than the following amounts of added sugar per day:  6  teaspoons (25 g) for women.  9 teaspoons (38 g) for men. ? Foods that contain processed or refined starches and grains. ? Refined grain products, such as  white flour, degermed cornmeal, white bread, and white rice. Shopping  Choose nutrient-rich snacks, such as vegetables, whole fruits, and nuts. Avoid high-calorie and high-sugar snacks, such as potato chips, fruit snacks, and candy.  Use oil-based dressings and spreads on foods instead of solid fats such as butter, stick margarine, or cream cheese.  Limit pre-made sauces, mixes, and "instant" products such as flavored rice, instant noodles, and ready-made pasta.  Try more plant-protein sources, such as tofu, tempeh, black beans, edamame, lentils, nuts, and seeds.  Explore eating plans such as the Mediterranean diet or vegetarian diet. Cooking  Use oil to saut or stir-fry foods instead of solid fats such as butter, stick margarine, or lard.  Try baking, boiling, grilling, or broiling instead of frying.  Remove the fatty part of meats before cooking.  Steam vegetables in water or broth. Meal planning  At meals, imagine dividing your plate into fourths: ? One-half of your plate is fruits and vegetables. ? One-fourth of your plate is whole grains. ? One-fourth of your plate is protein, especially lean meats, poultry, eggs, tofu, beans, or nuts.  Include low-fat dairy as part of your daily diet.   Lifestyle  Choose healthy options in all settings, including home, work, school, restaurants, or stores.  Prepare your food safely: ? Wash your hands after handling raw meats. ? Keep food preparation surfaces clean by regularly washing with hot, soapy water. ? Keep raw meats separate from ready-to-eat foods, such as fruits and vegetables. ? Cook seafood, meat, poultry, and eggs to the recommended internal temperature. ? Store foods at safe temperatures. In general:  Keep cold foods at 86F (4.4C) or below.  Keep hot foods at 186F (60C) or above.  Keep your freezer at [facility] (-17.8C) or below.  Foods are no longer safe to eat when they have been between the temperatures of  40-186F (4.4-60C) for more than 2 hours. What foods should I eat? Fruits Aim to eat 2 cup-equivalents of fresh, canned (in natural juice), or frozen fruits each day. Examples of 1 cup-equivalent of fruit include 1 small apple, 8 large strawberries, 1 cup canned fruit,  cup dried fruit, or 1 cup 100% juice. Vegetables Aim to eat 2-3 cup-equivalents of fresh and frozen vegetables each day, including different varieties and colors. Examples of 1 cup-equivalent of vegetables include 2 medium carrots, 2 cups raw, leafy greens, 1 cup chopped vegetable (raw or cooked), or 1 medium baked potato. Grains Aim to eat 6 ounce-equivalents of whole grains each day. Examples of 1 ounce-equivalent of grains include 1 slice of bread, 1 cup ready-to-eat cereal, 3 cups popcorn, or  cup cooked rice, pasta, or cereal. Meats and other proteins Aim to eat 5-6 ounce-equivalents of protein each day. Examples of 1 ounce-equivalent of protein include 1 egg, 1/2 cup nuts or seeds, or 1 tablespoon (16 g) peanut butter. A cut of meat or fish that is the size of a deck of cards is about 3-4 ounce-equivalents.  Of the protein you eat each week, try to have at least 8 ounces come from seafood. This includes salmon, trout, herring, and anchovies. Dairy Aim to eat 3 cup-equivalents of fat-free or low-fat dairy each day. Examples of 1 cup-equivalent of dairy include 1 cup (240 mL) milk, 8 ounces (250 g) yogurt, 1 ounces (44 g) natural cheese, or  1 cup (240 mL) fortified soy milk. Fats and oils  Aim for about 5 teaspoons (21 g) per day. Choose monounsaturated fats, such as canola and olive oils, avocados, peanut butter, and most nuts, or polyunsaturated fats, such as sunflower, corn, and soybean oils, walnuts, pine nuts, sesame seeds, sunflower seeds, and flaxseed. Beverages  Aim for six 8-oz glasses of water per day. Limit coffee to three to five 8-oz cups per day.  Limit caffeinated beverages that have added calories, such  as soda and energy drinks.  Limit alcohol intake to no more than 1 drink a day for nonpregnant women and 2 drinks a day for men. One drink equals 12 oz of beer (355 mL), 5 oz of wine (148 mL), or 1 oz of hard liquor (44 mL). Seasoning and other foods  Avoid adding excess amounts of salt to your foods. Try flavoring foods with herbs and spices instead of salt.  Avoid adding sugar to foods.  Try using oil-based dressings, sauces, and spreads instead of solid fats. This information is based on general [place] nutrition guidelines. For more information, visit BuildDNA.es. Exact amounts may vary based on your nutrition needs. Summary  A healthy eating plan may help you to maintain a healthy weight, reduce the risk of chronic diseases, and stay active throughout your life.  Plan your meals. Make sure you eat the right portions of a variety of nutrient-rich foods.  Try baking, boiling, grilling, or broiling instead of frying.  Choose healthy options in all settings, including home, work, school, restaurants, or stores. This information is not intended to replace advice given to you by your health care provider. Make sure you discuss any questions you have with your health care provider. Document Revised: 05/09/2017 Document Reviewed: 05/09/2017 Elsevier Patient Education  2021 [place].  Diabetes Mellitus and Nutrition, Adult When you have diabetes, or diabetes mellitus, it is very important to have healthy eating habits because your blood sugar (glucose) levels are greatly affected by what you eat and drink. Eating healthy foods in the right amounts, at about the same times every day, can help you:  Control your blood glucose.  Lower your risk of heart disease.  Improve your blood pressure.  Reach or maintain a healthy weight. What can affect my meal plan? Every person with diabetes is different, and each person has different needs for a meal plan. Your health care provider may  recommend that you work with a dietitian to make a meal plan that is best for you. Your meal plan may vary depending on factors such as:  The calories you need.  The medicines you take.  Your weight.  Your blood glucose, blood pressure, and cholesterol levels.  Your activity level.  Other health conditions you have, such as heart or kidney disease. How do carbohydrates affect me? Carbohydrates, also called carbs, affect your blood glucose level more than any other type of food. Eating carbs naturally raises the amount of glucose in your blood. Carb counting is a method for keeping track of how many carbs you eat. Counting carbs is important to keep your blood glucose at a healthy level, especially if you use insulin or take certain oral diabetes medicines. It is important to know how many carbs you can safely have in each meal. This is different for every person. Your dietitian can help you calculate how many carbs you should have at each meal and for each snack. How does alcohol affect me? Alcohol can cause a sudden  decrease in blood glucose (hypoglycemia), especially if you use insulin or take certain oral diabetes medicines. Hypoglycemia can be a life-threatening condition. Symptoms of hypoglycemia, such as sleepiness, dizziness, and confusion, are similar to symptoms of having too much alcohol.  Do not drink alcohol if: ? Your health care provider tells you not to drink. ? You are pregnant, may be pregnant, or are planning to become pregnant.  If you drink alcohol: ? Do not drink on an empty stomach. ? Limit how much you use to:  0-1 drink a day for women.  0-2 drinks a day for men. ? Be aware of how much alcohol is in your drink. In the [place] one drink equals one 12 oz bottle of beer (355 mL), one 5 oz glass of wine (148 mL), or one 1 oz glass of hard liquor (44 mL). ? Keep yourself hydrated with water, diet soda, or unsweetened iced tea.  Keep in mind that regular soda, juice,  and other mixers may contain a lot of sugar and must be counted as carbs. What are tips for following this plan? Reading food labels  Start by checking the serving size on the "Nutrition Facts" label of packaged foods and drinks. The amount of calories, carbs, fats, and other nutrients listed on the label is based on one serving of the item. Many items contain more than one serving per package.  Check the total grams (g) of carbs in one serving. You can calculate the number of servings of carbs in one serving by dividing the total carbs by 15. For example, if a food has 30 g of total carbs per serving, it would be equal to 2 servings of carbs.  Check the number of grams (g) of saturated fats and trans fats in one serving. Choose foods that have a low amount or none of these fats.  Check the number of milligrams (mg) of salt (sodium) in one serving. Most people should limit total sodium intake to less than 2,300 mg per day.  Always check the nutrition information of foods labeled as "low-fat" or "nonfat." These foods may be higher in added sugar or refined carbs and should be avoided.  Talk to your dietitian to identify your daily goals for nutrients listed on the label. Shopping  Avoid buying canned, pre-made, or processed foods. These foods tend to be high in fat, sodium, and added sugar.  Shop around the outside edge of the grocery store. This is where you will most often find fresh fruits and vegetables, bulk grains, fresh meats, and fresh dairy. Cooking  Use low-heat cooking methods, such as baking, instead of high-heat cooking methods like deep frying.  Cook using healthy oils, such as olive, canola, or sunflower oil.  Avoid cooking with butter, cream, or high-fat meats. Meal planning  Eat meals and snacks regularly, preferably at the same times every day. Avoid going long periods of time without eating.  Eat foods that are high in fiber, such as fresh fruits, vegetables, beans,  and whole grains. Talk with your dietitian about how many servings of carbs you can eat at each meal.  Eat 4-6 oz (112-168 g) of lean protein each day, such as lean meat, chicken, fish, eggs, or tofu. One ounce (oz) of lean protein is equal to: ? 1 oz (28 g) of meat, chicken, or fish. ? 1 egg. ?  cup (62 g) of tofu.  Eat some foods each day that contain healthy fats, such as avocado, nuts, seeds, and  fish.   What foods should I eat? Fruits Berries. Apples. Oranges. Peaches. Apricots. Plums. Grapes. Mango. Papaya. Pomegranate. Kiwi. Cherries. Vegetables Lettuce. Spinach. Leafy greens, including kale, chard, collard greens, and mustard greens. Beets. Cauliflower. Cabbage. Broccoli. Carrots. Green beans. Tomatoes. Peppers. Onions. Cucumbers. Brussels sprouts. Grains Whole grains, such as whole-wheat or whole-grain bread, crackers, tortillas, cereal, and pasta. Unsweetened oatmeal. Quinoa. Brown or wild rice. Meats and other proteins Seafood. Poultry without skin. Lean cuts of poultry and beef. Tofu. Nuts. Seeds. Dairy Low-fat or fat-free dairy products such as milk, yogurt, and cheese. The items listed above may not be a complete list of foods and beverages you can eat. Contact a dietitian for more information. What foods should I avoid? Fruits Fruits canned with syrup. Vegetables Canned vegetables. Frozen vegetables with butter or cream sauce. Grains Refined white flour and flour products such as bread, pasta, snack foods, and cereals. Avoid all processed foods. Meats and other proteins Fatty cuts of meat. Poultry with skin. Breaded or fried meats. Processed meat. Avoid saturated fats. Dairy Full-fat yogurt, cheese, or milk. Beverages Sweetened drinks, such as soda or iced tea. The items listed above may not be a complete list of foods and beverages you should avoid. Contact a dietitian for more information. Questions to ask a health care provider  Do I need to meet with a diabetes  educator?  Do I need to meet with a dietitian?  What number can I call if I have questions?  When are the best times to check my blood glucose? Where to find more information:  American Diabetes Association: diabetes.org  Academy of Nutrition and Dietetics: www.eatright.[employer] of Diabetes and Digestive and Kidney Diseases: [employer]  Association of Diabetes Care and Education Specialists: www.diabeteseducator.org Summary  It is important to have healthy eating habits because your blood sugar (glucose) levels are greatly affected by what you eat and drink.  A healthy meal plan will help you control your blood glucose and maintain a healthy lifestyle.  Your health care provider may recommend that you work with a dietitian to make a meal plan that is best for you.  Keep in mind that carbohydrates (carbs) and alcohol have immediate effects on your blood glucose levels. It is important to count carbs and to use alcohol carefully. This information is not intended to replace advice given to you by your health care provider. Make sure you discuss any questions you have with your health care provider. Document Revised: 01/02/2019 Document Reviewed: 01/02/2019 Elsevier Patient Education  2021 [place] refers to food and lifestyle choices that are based on the traditions of countries located on the [employer]. This way of eating has been shown to help prevent certain conditions and improve outcomes for people who have chronic diseases, like kidney disease and heart disease. What are tips for following this plan? Lifestyle  Cook and eat meals together with your family, when possible.  Drink enough fluid to keep your urine clear or pale yellow.  Be physically active every day. This includes: ? Aerobic exercise like running or swimming. ? Leisure activities like gardening, walking, or housework.  Get 7-8  hours of sleep each night.  If recommended by your health care provider, drink red wine in moderation. This means 1 glass a day for nonpregnant women and 2 glasses a day for men. A glass of wine equals 5 oz (150 mL). Reading food labels  Check the serving size of  packaged foods. For foods such as rice and pasta, the serving size refers to the amount of cooked product, not dry.  Check the total fat in packaged foods. Avoid foods that have saturated fat or trans fats.  Check the ingredients list for added sugars, such as corn syrup.   Shopping  At the grocery store, buy most of your food from the areas near the walls of the store. This includes: ? Fresh fruits and vegetables (produce). ? Grains, beans, nuts, and seeds. Some of these may be available in unpackaged forms or large amounts (in bulk). ? Fresh seafood. ? Poultry and eggs. ? Low-fat dairy products.  Buy whole ingredients instead of prepackaged foods.  Buy fresh fruits and vegetables in-season from local farmers markets.  Buy frozen fruits and vegetables in resealable bags.  If you do not have access to quality fresh seafood, buy precooked frozen shrimp or canned fish, such as tuna, salmon, or sardines.  Buy small amounts of raw or cooked vegetables, salads, or olives from the deli or salad bar at your store.  Stock your pantry so you always have certain foods on hand, such as olive oil, canned tuna, canned tomatoes, rice, pasta, and beans. Cooking  Cook foods with extra-virgin olive oil instead of using butter or other vegetable oils.  Have meat as a side dish, and have vegetables or grains as your main dish. This means having meat in small portions or adding small amounts of meat to foods like pasta or stew.  Use beans or vegetables instead of meat in common dishes like chili or lasagna.  Experiment with different cooking methods. Try roasting or broiling vegetables instead of steaming or sauteing them.  Add frozen  vegetables to soups, stews, pasta, or rice.  Add nuts or seeds for added healthy fat at each meal. You can add these to yogurt, salads, or vegetable dishes.  Marinate fish or vegetables using olive oil, lemon juice, garlic, and fresh herbs. Meal planning  Plan to eat 1 vegetarian meal one day each week. Try to work up to 2 vegetarian meals, if possible.  Eat seafood 2 or more times a week.  Have healthy snacks readily available, such as: ? Vegetable sticks with hummus. ? [place] yogurt. ? Fruit and nut trail mix.  Eat balanced meals throughout the week. This includes: ? Fruit: 2-3 servings a day ? Vegetables: 4-5 servings a day ? Low-fat dairy: 2 servings a day ? Fish, poultry, or lean meat: 1 serving a day ? Beans and legumes: 2 or more servings a week ? Nuts and seeds: 1-2 servings a day ? Whole grains: 6-8 servings a day ? Extra-virgin olive oil: 3-4 servings a day  Limit red meat and sweets to only a few servings a month   What are my food choices?  Mediterranean diet ? Recommended  Grains: Whole-grain pasta. Brown rice. Bulgar wheat. Polenta. Couscous. Whole-wheat bread. [place].  Vegetables: Artichokes. Beets. Broccoli. Cabbage. Carrots. Eggplant. Green beans. Chard. Kale. Spinach. Onions. Leeks. Peas. Squash. Tomatoes. Peppers. Radishes.  Fruits: Apples. Apricots. Avocado. Berries. Bananas. Cherries. Dates. Figs. Grapes. Lemons. Melon. Oranges. Peaches. Plums. Pomegranate.  Meats and other protein foods: Beans. Almonds. Sunflower seeds. Pine nuts. Peanuts. [place]. Salmon. Scallops. Shrimp. [place]. Tilapia. Clams. Oysters. Eggs.  Dairy: Low-fat milk. Cheese. Greek yogurt.  Beverages: Water. Red wine. Herbal tea.  Fats and oils: Extra virgin olive oil. Avocado oil. Grape seed oil.  Sweets and desserts: [place] yogurt with honey. Baked apples. Poached pears.  Trail mix.  Seasoning and other foods: Basil. Cilantro. Coriander. Cumin. Mint. Parsley. Sage. Rosemary.  Tarragon. Garlic. Oregano. Thyme. Pepper. Balsalmic vinegar. Tahini. Hummus. Tomato sauce. Olives. Mushrooms. ? Limit these  Grains: Prepackaged pasta or rice dishes. Prepackaged cereal with added sugar.  Vegetables: Deep fried potatoes (french fries).  Fruits: Fruit canned in syrup.  Meats and other protein foods: Beef. Pork. Lamb. Poultry with skin. Hot dogs. Berniece Salines.  Dairy: Ice cream. Sour cream. Whole milk.  Beverages: Juice. Sugar-sweetened soft drinks. Beer. Liquor and spirits.  Fats and oils: Butter. Canola oil. Vegetable oil. Beef fat (tallow). Lard.  Sweets and desserts: Cookies. Cakes. Pies. Candy.  Seasoning and other foods: Mayonnaise. Premade sauces and marinades. The items listed may not be a complete list. Talk with your dietitian about what dietary choices are right for you. Summary  The Mediterranean diet includes both food and lifestyle choices.  Eat a variety of fresh fruits and vegetables, beans, nuts, seeds, and whole grains.  Limit the amount of red meat and sweets that you eat.  Talk with your health care provider about whether it is safe for you to drink red wine in moderation. This means 1 glass a day for nonpregnant women and 2 glasses a day for men. A glass of wine equals 5 oz (150 mL). This information is not intended to replace advice given to you by your health care provider. Make sure you discuss any questions you have with your health care provider. Document Revised: 09/25/2015 Document Reviewed: 09/18/2015 Elsevier Patient Education  [place].     Adopting a Healthy Lifestyle.  Know what a healthy weight is for you (roughly BMI <25) and aim to maintain this   Aim for 7+ servings of fruits and vegetables daily   65-80+ fluid ounces of water or unsweet tea for healthy kidneys   Limit to max 1 drink of alcohol per day; avoid smoking/tobacco   Limit animal fats in diet for cholesterol and heart health - choose grass fed whenever  available   Avoid highly processed foods, and foods high in saturated/trans fats   Aim for low stress - take time to unwind and care for your mental health   Aim for 150 min of moderate intensity exercise weekly for heart health, and weights twice weekly for bone health   Aim for 7-9 hours of sleep daily   When it comes to diets, agreement about the perfect plan isnt easy to find, even among the experts. Experts at the [place] developed an idea known as the Healthy Eating Plate. Just imagine a plate divided into logical, healthy portions.   The emphasis is on diet quality:   Load up on vegetables and fruits - one-half of your plate: Aim for color and variety, and remember that potatoes dont count.   Go for whole grains - one-quarter of your plate: Whole wheat, barley, wheat berries, quinoa, oats, brown rice, and foods made with them. If you want pasta, go with whole wheat pasta.   Protein power - one-quarter of your plate: Fish, chicken, beans, and nuts are all healthy, versatile protein sources. Limit red meat.   The diet, however, does go beyond the plate, offering a few other suggestions.   Use healthy plant oils, such as olive, canola, soy, corn, sunflower and peanut. Check the labels, and avoid partially hydrogenated oil, which have unhealthy trans fats.   If youre thirsty, drink water. Coffee and tea are good in moderation, but  skip sugary drinks and limit milk and dairy products to one or two daily servings.   The type of carbohydrate in the diet is more important than the amount. Some sources of carbohydrates, such as vegetables, fruits, whole grains, and beans-are healthier than others.   Finally, stay active  Signed, Berniece Salines, DO  04/11/2020 4:50 PM    [facility]

## 2020-04-14 ENCOUNTER — Telehealth: Payer: Self-pay | Admitting: Cardiology

## 2020-04-14 NOTE — Telephone Encounter (Signed)
Spoke to the patient just now and let her know Dr. Mallory Shirk recommendations. She verbalizes understanding and states that she will think about going to the hospital but is not sure if she will or not.    Encouraged patient to call back with any questions or concerns.

## 2020-04-14 NOTE — Telephone Encounter (Signed)
Pt c/o swelling: STAT is pt has developed SOB within 24 hours  1) How much weight have you gained and in what time span? Not sure of gain since appt   2) If swelling, where is the swelling located? Legs (weeping and sores on calves that itch) and feet   3) Are you currently taking a fluid pill? Yes   4) Are you currently SOB? No   5) Do you have a log of your daily weights (if so, list)? No   6) Have you gained 3 pounds in a day or 5 pounds in a week? Not sure   7) Have you traveled recently? No   Suzanne Carey states she is very concerned with the weeping and sores on her legs that have developed since her last appt due to being diabetic. She states she is scheduled with her PCP for Wednesday to have them looked at, but is requesting recommendations in regards to this ASAP due to being fearful this could cause her to lose her legs.

## 2020-04-14 NOTE — Telephone Encounter (Signed)
Please let her know that ideally the best thing would be for her to go to the hospital for IV Lasix which will help with over a day or 2 diurese her better.

## 2020-04-16 DIAGNOSIS — I10 Essential (primary) hypertension: Secondary | ICD-10-CM | POA: Diagnosis not present

## 2020-04-16 DIAGNOSIS — E1165 Type 2 diabetes mellitus with hyperglycemia: Secondary | ICD-10-CM | POA: Diagnosis not present

## 2020-04-16 DIAGNOSIS — R609 Edema, unspecified: Secondary | ICD-10-CM | POA: Diagnosis not present

## 2020-04-16 DIAGNOSIS — I5033 Acute on chronic diastolic (congestive) heart failure: Secondary | ICD-10-CM | POA: Diagnosis not present

## 2020-04-17 DIAGNOSIS — R600 Localized edema: Secondary | ICD-10-CM | POA: Diagnosis not present

## 2020-04-17 DIAGNOSIS — M79606 Pain in leg, unspecified: Secondary | ICD-10-CM | POA: Diagnosis not present

## 2020-04-23 DIAGNOSIS — I10 Essential (primary) hypertension: Secondary | ICD-10-CM | POA: Diagnosis not present

## 2020-04-23 DIAGNOSIS — E782 Mixed hyperlipidemia: Secondary | ICD-10-CM | POA: Diagnosis not present

## 2020-04-23 DIAGNOSIS — Z79899 Other long term (current) drug therapy: Secondary | ICD-10-CM | POA: Diagnosis not present

## 2020-04-23 DIAGNOSIS — E1165 Type 2 diabetes mellitus with hyperglycemia: Secondary | ICD-10-CM | POA: Diagnosis not present

## 2020-05-09 DIAGNOSIS — I5033 Acute on chronic diastolic (congestive) heart failure: Secondary | ICD-10-CM | POA: Insufficient documentation

## 2020-05-09 DIAGNOSIS — I5032 Chronic diastolic (congestive) heart failure: Secondary | ICD-10-CM

## 2020-05-09 HISTORY — DX: Chronic diastolic (congestive) heart failure: I50.32

## 2020-05-12 ENCOUNTER — Other Ambulatory Visit: Payer: Self-pay

## 2020-05-12 ENCOUNTER — Encounter: Payer: Self-pay | Admitting: Cardiology

## 2020-05-12 ENCOUNTER — Telehealth: Payer: Self-pay

## 2020-05-12 ENCOUNTER — Ambulatory Visit: Payer: BC Managed Care – PPO | Admitting: Cardiology

## 2020-05-12 VITALS — BP 122/70 | HR 88 | Ht 68.0 in | Wt 328.6 lb

## 2020-05-12 DIAGNOSIS — I272 Pulmonary hypertension, unspecified: Secondary | ICD-10-CM | POA: Diagnosis not present

## 2020-05-12 DIAGNOSIS — Z794 Long term (current) use of insulin: Secondary | ICD-10-CM

## 2020-05-12 DIAGNOSIS — Z79899 Other long term (current) drug therapy: Secondary | ICD-10-CM

## 2020-05-12 DIAGNOSIS — E119 Type 2 diabetes mellitus without complications: Secondary | ICD-10-CM | POA: Diagnosis not present

## 2020-05-12 DIAGNOSIS — L03116 Cellulitis of left lower limb: Secondary | ICD-10-CM | POA: Diagnosis not present

## 2020-05-12 DIAGNOSIS — I5033 Acute on chronic diastolic (congestive) heart failure: Secondary | ICD-10-CM

## 2020-05-12 DIAGNOSIS — I482 Chronic atrial fibrillation, unspecified: Secondary | ICD-10-CM | POA: Diagnosis not present

## 2020-05-12 NOTE — Progress Notes (Addendum)
Cardiology Office Note:    Date:  05/12/2020   ID:  Suzanne Carey, DOB [birth year], MRN [id number]  PCP:  Galvin ProfferHague, Imran P, MD  Cardiologist:  Thomasene RippleKardie Jamel Dunton, DO  Electrophysiologist:  None   Referring MD: Galvin ProfferHague, Imran P, MD   I am feeling a lot better  History of Present Illness:    Suzanne Carey is a 60 y.o. female with a hx of atrial fibrillation on Eliquis, hypertension, hyperlipidemia, morbid obesity, diabetes is here today for follow-up visit.  I saw the patient in July 2021 at that time she was in atrial fibrillation but the patient declined DC cardioversion. We will continue her on metoprolol and Eliquis.  I saw the patient on February 09, 2020 we talked about cardioversion which she also did decline. We also discussed weight loss and I referred the patient to our healthy wellness program.  When I saw the patient in February  she did have significant volume overload as well as increasing heart rate.  I recommended patient go to the emergency department to be treated for IV Lasix and she declined.  So I started her on Lasix 40 mg twice a day and gave her metolazone 2.5 mg for 3 days.  We had some blood work which showed increased BNP.  Also loaded the patient with digoxin.  She was seen on April 11, 2020 at that time she has lost significant fluid volume.  At that visit I still wanted the patient to be inpatient for IV diuretics but she declined.  Therefore we kept her on the Lasix 40 mg twice a day and also gave her metolazone 5 mg for 7 days.  She is here today for follow-up visit.  I am doing little better.  But her to have infection on one leg and is very sensitive.  Past Medical History:  Diagnosis Date  . Anxiety disorder   . Atrial fibrillation (HCC) 03/28/2019  . Chronic atrial fibrillation, unspecified (HCC) 03/28/2019  . Depressed left ventricular ejection fraction 03/28/2019  . Diabetes mellitus (HCC) 03/28/2019  . Dietary counseling and surveillance  03/28/2019  . Essential hypertension   . Gastro-esophageal reflux disease without esophagitis   . Generalized anxiety disorder   . Hypercholesterolemia 03/17/2020  . Hyperglycemia due to type 2 diabetes mellitus (HCC)   . Hypertensive disorder 03/17/2020  . Hypothyroidism   . Mixed hyperlipidemia   . Morbid obesity (HCC) 03/27/2019  . Non-toxic multinodular goiter   . Nontoxic multinodular goiter   . Occlusion and stenosis of bilateral carotid arteries 03/31/2020  . Other long term (current) drug therapy 02/15/2020  . Other specified hypothyroidism   . Other vitamin B12 deficiency anemias   . Plantar fasciitis 12/17/2019  . Polyp at cervical os 03/27/2019  . Primary generalized (osteo)arthritis   . Pulmonary hypertension (HCC) 03/28/2019  . Shortness of breath 04/02/2020  . Tightness of heel cord, left 12/17/2019  . Type 2 diabetes mellitus with hyperglycemia (HCC)   . Type 2 diabetes mellitus without complication, with long-term current use of insulin (HCC) 03/28/2019  . Unstable angina (HCC)   . Vitamin D deficiency     Past Surgical History:  Procedure Laterality Date  . CESAREAN SECTION  2002  . CHOLECYSTECTOMY  1994    Current Medications: Current Meds  Medication Sig  . ALPRAZolam (XANAX) 0.5 MG tablet Take 0.5 mg by mouth 3 (three) times daily as needed for anxiety or sleep.  Marland Kitchen. digoxin (LANOXIN) 0.125 MG tablet Take 1 tablet (0.125  mg total) by mouth daily.  Marland Kitchen ELIQUIS 5 MG TABS tablet TAKE 1 TABLET BY MOUTH TWICE A DAY  . ergocalciferol (VITAMIN D2) 1.25 MG (50000 UT) capsule ergocalciferol (vitamin D2) 1,250 mcg (50,000 unit) capsule  TAKE 1 CAPSULE BY MOUTH TWICE WEEKLY  . furosemide (LASIX) 40 MG tablet Take 1 tablet (40 mg total) by mouth 2 (two) times daily.  . hydrochlorothiazide (HYDRODIURIL) 25 MG tablet TAKE 1 TABLET BY MOUTH EVERY DAY  . liraglutide (VICTOZA) 18 MG/3ML SOPN Victoza 3-Pak 0.6 mg/0.1 mL (18 mg/3 mL) subcutaneous pen injector  INJECT 1.8 MG ONCE A DAY  SUBCUTANEOUSLY 30 DAYS  . Magnesium 400 MG TABS Take 400 mg by mouth in the morning and at bedtime.  . metolazone (ZAROXOLYN) 5 MG tablet Take 1 tablet (5 mg total) by mouth daily.  . metoprolol succinate (TOPROL-XL) 25 MG 24 hr tablet TAKE 1 TABLET BY MOUTH EVERY DAY  . nitroGLYCERIN (NITROSTAT) 0.4 MG SL tablet Place under the tongue.  Marland Kitchen olmesartan (BENICAR) 20 MG tablet Take 1 tablet (20 mg total) by mouth daily.  Marland Kitchen omeprazole (PRILOSEC) 40 MG capsule Take 40 mg by mouth daily.  . pioglitazone (ACTOS) 30 MG tablet Take 30 mg by mouth daily.  . potassium chloride SA (KLOR-CON M20) 20 MEQ tablet Take 1 tablet (20 mEq total) by mouth daily.     Allergies:   Dapagliflozin, Lipitor [atorvastatin], and Latex   Social History   Socioeconomic History  . Marital status: Married    Spouse name: Not on file  . Number of children: Not on file  . Years of education: Not on file  . Highest education level: Not on file  Occupational History  . Not on file  Tobacco Use  . Smoking status: Never Smoker  . Smokeless tobacco: Never Used  Substance and Sexual Activity  . Alcohol use: Never  . Drug use: Never  . Sexual activity: Not on file  Other Topics Concern  . Not on file  Social History Narrative  . Not on file   Social Determinants of Health   Financial Resource Strain: Not on file  Food Insecurity: Not on file  Transportation Needs: Not on file  Physical Activity: Not on file  Stress: Not on file  Social Connections: Not on file     Family History: The patient's family history includes Breast cancer (age of onset: 36) in her cousin and paternal aunt; Cancer in her brother; Diabetes in her mother; Heart disease in her mother and sister; Hypertension in her mother and sister; Multiple sclerosis in her sister; Stroke in her mother.  ROS:   Review of Systems  Constitution: Negative for decreased appetite, fever and weight gain.  HENT: Negative for congestion, ear discharge, hoarse  voice and sore throat.   Eyes: Negative for discharge, redness, vision loss in right eye and visual halos.  Cardiovascular: Negative for chest pain, dyspnea on exertion, leg swelling, orthopnea and palpitations.  Respiratory: Negative for cough, hemoptysis, shortness of breath and snoring.   Endocrine: Negative for heat intolerance and polyphagia.  Hematologic/Lymphatic: Negative for bleeding problem. Does not bruise/bleed easily.  Skin: Negative for flushing, nail changes, rash and suspicious lesions.  Musculoskeletal: Negative for arthritis, joint pain, muscle cramps, myalgias, neck pain and stiffness.  Gastrointestinal: Negative for abdominal pain, bowel incontinence, diarrhea and excessive appetite.  Genitourinary: Negative for decreased libido, genital sores and incomplete emptying.  Neurological: Negative for brief paralysis, focal weakness, headaches and loss of balance.  Psychiatric/Behavioral: Negative  for altered mental status, depression and suicidal ideas.  Allergic/Immunologic: Negative for HIV exposure and persistent infections.    EKGs/Labs/Other Studies Reviewed:    The following studies were reviewed today:   EKG:  The ekg ordered today demonstrates atrial fibrillation with controlled ventricular rate.  Nonspecific ST changes compared to prior EKG no significant change.  Recent Labs: 04/03/2020: NT-Pro BNP 8,165 04/11/2020: BUN 15; Creatinine, Ser 1.35; Magnesium 1.5; Potassium 3.0; Sodium 138  Recent Lipid Panel    Component Value Date/Time   CHOL 186 03/28/2019 1136   TRIG 157 (H) 03/28/2019 1136   HDL 42 03/28/2019 1136   CHOLHDL 4.4 03/28/2019 1136   LDLCALC 116 (H) 03/28/2019 1136    Physical Exam:    VS:  BP 122/70   Pulse 88   Ht 5\' 8"  (1.727 m)   Wt (!) 328 lb 9.6 oz (149.1 kg)   SpO2 98%   BMI 49.96 kg/m     Wt Readings from Last 3 Encounters:  05/12/20 (!) 328 lb 9.6 oz (149.1 kg)  04/11/20 (!) 344 lb (156 kg)  04/03/20 (!) 360 lb 6.4 oz (163.5  kg)     GEN: Well nourished, well developed in no acute distress HEENT: Normal NECK: No JVD; No carotid bruits LYMPHATICS: No lymphadenopathy CARDIAC: S1S2 noted,RRR, no murmurs, rubs, gallops RESPIRATORY:  Clear to auscultation without rales, wheezing or rhonchi  ABDOMEN: Soft, non-tender, non-distended, +bowel sounds, no guarding. EXTREMITIES: No edema, No cyanosis, no clubbing MUSCULOSKELETAL:  No deformity  SKIN: Warm and dry NEUROLOGIC:  Alert and oriented x 3, non-focal PSYCHIATRIC:  Normal affect, good insight  ASSESSMENT:    1. Chronic atrial fibrillation, unspecified (HCC)   2. Pulmonary hypertension (HCC)   3. Acute on chronic diastolic heart failure (HCC)   4. Type 2 diabetes mellitus without complication, with long-term current use of insulin (HCC)   5. Morbid obesity (HCC)   6. Medication management    PLAN:    Clinically she is improved.  She has lost significant amount of weight in March she was 344 pounds today she is 328 pounds.  We will keep the patient on her Lasix 40 mg twice a day.  I have asked the patient to stop the metolazone.  She does have left leg cellulitis which is being treated with Levaquin by her primary care provider.  On physical exam is significantly tender with evidence of pus as well as it is erythematous.  I do think the patient will benefit from IV antibiotics.  I have advised that she will need to go to the wound center.  She prefer to speak to her primary care provider first.  She plans to call him after her appointment.  She is in atrial fibrillation today.  She still is not agreeable for any rhythm control.  We plan to do an echocardiogram today but she has gotten this test done at her primary care doctor office.  We have asked for records to be sent to our office.  She has not been able to be seen by our healthy weight and wellness program to follow we will refer the patient to this program.  The patient understands the need to lose weight  with diet and exercise. We have discussed specific strategies for this.  Were able to get the echocardiogram for the patient PCP office which showed EF 55 to 60%.  Moderately dilated left atrium.  Moderately dilated right atrium.  Aortic valve sclerosis with no stenosis.  Mild mitral  regurgitation.  Tricuspid valve with mild regurgitation.  Right ventricle systolic pressure 40-45 mm consistent with mild pulmonary hypertension.  Compared to prior echocardiogram there has been some improvement in March 21, 2019 EF was 45 to 50%.  Lab work will be done today to assess kidney function digoxin level.  The patient is in agreement with the above plan. The patient left the office in stable condition.  The patient will follow up in 3 months or sooner if needed.   Medication Adjustments/Labs and Tests Ordered: Current medicines are reviewed at length with the patient today.  Concerns regarding medicines are outlined above.  Orders Placed This Encounter  Procedures  . Basic metabolic panel  . Magnesium  . Digitoxin level  . Amb Ref to Medical Weight Management  . EKG 12-Lead   No orders of the defined types were placed in this encounter.   Patient Instructions  Medication Instructions:  Your physician recommends that you continue on your current medications as directed. Please refer to the Current Medication list given to you today.  *If you need a refill on your cardiac medications before your next appointment, please call your pharmacy*   Lab Work: Your physician recommends that you return for lab work: TODAY: BMET, Mag, Digoxin level If you have labs (blood work) drawn today and your tests are completely normal, you will receive your results only by: Marland Kitchen MyChart Message (if you have MyChart) OR . A paper copy in the mail If you have any lab test that is abnormal or we need to change your treatment, we will call you to review the results.   Testing/Procedures: None   Follow-Up: At  [facility], you and your health needs are our priority.  As part of our continuing mission to provide you with exceptional heart care, we have created designated Provider Care Teams.  These Care Teams include your primary Cardiologist (physician) and Advanced Practice Providers (APPs -  Physician Assistants and Nurse Practitioners) who all work together to provide you with the care you need, when you need it.  We recommend signing up for the patient portal called "MyChart".  Sign up information is provided on this After Visit Summary.  MyChart is used to connect with patients for Virtual Visits (Telemedicine).  Patients are able to view lab/test results, encounter notes, upcoming appointments, etc.  Non-urgent messages can be sent to your provider as well.   To learn more about what you can do with MyChart, go to [email or web address].    Your next appointment:   3 month(s)  The format for your next appointment:   In Person  Provider:   Thomasene Ripple, DO   Other Instructions      Adopting a Healthy Lifestyle.  Know what a healthy weight is for you (roughly BMI <25) and aim to maintain this   Aim for 7+ servings of fruits and vegetables daily   65-80+ fluid ounces of water or unsweet tea for healthy kidneys   Limit to max 1 drink of alcohol per day; avoid smoking/tobacco   Limit animal fats in diet for cholesterol and heart health - choose grass fed whenever available   Avoid highly processed foods, and foods high in saturated/trans fats   Aim for low stress - take time to unwind and care for your mental health   Aim for 150 min of moderate intensity exercise weekly for heart health, and weights twice weekly for bone health   Aim for 7-9 hours of sleep daily  When it comes to diets, agreement about the perfect plan isnt easy to find, even among the experts. Experts at the [facility] [employer] developed an idea known as the Healthy Eating Plate. Just imagine a  plate divided into logical, healthy portions.   The emphasis is on diet quality:   Load up on vegetables and fruits - one-half of your plate: Aim for color and variety, and remember that potatoes dont count.   Go for whole grains - one-quarter of your plate: Whole wheat, barley, wheat berries, quinoa, oats, brown rice, and foods made with them. If you want pasta, go with whole wheat pasta.   Protein power - one-quarter of your plate: Fish, chicken, beans, and nuts are all healthy, versatile protein sources. Limit red meat.   The diet, however, does go beyond the plate, offering a few other suggestions.   Use healthy plant oils, such as olive, canola, soy, corn, sunflower and peanut. Check the labels, and avoid partially hydrogenated oil, which have unhealthy trans fats.   If youre thirsty, drink water. Coffee and tea are good in moderation, but skip sugary drinks and limit milk and dairy products to one or two daily servings.   The type of carbohydrate in the diet is more important than the amount. Some sources of carbohydrates, such as vegetables, fruits, whole grains, and beans-are healthier than others.   Finally, stay active  Signed, Thomasene Ripple, DO  05/12/2020 10:18 AM    [facility]

## 2020-05-12 NOTE — Telephone Encounter (Signed)
Contacted patient's PCP to get the echo results. They state they will send it over.

## 2020-05-12 NOTE — Patient Instructions (Signed)
Medication Instructions:  Your physician recommends that you continue on your current medications as directed. Please refer to the Current Medication list given to you today.  *If you need a refill on your cardiac medications before your next appointment, please call your pharmacy*   Lab Work: Your physician recommends that you return for lab work: TODAY: BMET, Mag, Digoxin level If you have labs (blood work) drawn today and your tests are completely normal, you will receive your results only by: Marland Kitchen MyChart Message (if you have MyChart) OR . A paper copy in the mail If you have any lab test that is abnormal or we need to change your treatment, we will call you to review the results.   Testing/Procedures: None   Follow-Up: At [facility], you and your health needs are our priority.  As part of our continuing mission to provide you with exceptional heart care, we have created designated Provider Care Teams.  These Care Teams include your primary Cardiologist (physician) and Advanced Practice Providers (APPs -  Physician Assistants and Nurse Practitioners) who all work together to provide you with the care you need, when you need it.  We recommend signing up for the patient portal called "MyChart".  Sign up information is provided on this After Visit Summary.  MyChart is used to connect with patients for Virtual Visits (Telemedicine).  Patients are able to view lab/test results, encounter notes, upcoming appointments, etc.  Non-urgent messages can be sent to your provider as well.   To learn more about what you can do with MyChart, go to [email or web address].    Your next appointment:   3 month(s)  The format for your next appointment:   In Person  Provider:   Thomasene Ripple, DO   Other Instructions

## 2020-05-14 ENCOUNTER — Telehealth: Payer: Self-pay

## 2020-05-14 MED ORDER — MAGNESIUM 400 MG PO TABS: 400.0000 mg | ORAL_TABLET | Freq: Two times a day (BID) | ORAL | 0 refills | Status: DC

## 2020-05-14 NOTE — Telephone Encounter (Signed)
Spoke with patient about her echo results sent over from her PCP and also lab results we drew. Dr. Servando Salina wants patient to take Mag 400 mg twice daily for 14 days.  Patient verbalizes understanding. No further questions or concerns at this time.

## 2020-05-15 LAB — BASIC METABOLIC PANEL
BUN/Creatinine Ratio: 15 (ref 12–28)
BUN: 28 mg/dL — ABNORMAL HIGH (ref 8–27)
CO2: 24 mmol/L (ref 20–29)
Calcium: 9.7 mg/dL (ref 8.7–10.3)
Chloride: 96 mmol/L (ref 96–106)
Creatinine, Ser: 1.85 mg/dL — ABNORMAL HIGH (ref 0.57–1.00)
Glucose: 187 mg/dL — ABNORMAL HIGH (ref 65–99)
Potassium: 3.8 mmol/L (ref 3.5–5.2)
Sodium: 138 mmol/L (ref 134–144)
eGFR: 31 mL/min/{1.73_m2} — ABNORMAL LOW (ref >59)

## 2020-05-15 LAB — DIGITOXIN LEVEL: Digitoxin Lvl: <5 ng/mL — ABNORMAL LOW (ref 10–25)

## 2020-05-15 LAB — MAGNESIUM: Magnesium: 1.5 mg/dL — ABNORMAL LOW (ref 1.6–2.3)

## 2020-05-29 ENCOUNTER — Other Ambulatory Visit: Payer: Self-pay | Admitting: Cardiology

## 2020-05-30 NOTE — Telephone Encounter (Signed)
Refill sent to pharmacy.   

## 2020-06-23 DIAGNOSIS — I10 Essential (primary) hypertension: Secondary | ICD-10-CM | POA: Diagnosis not present

## 2020-06-23 DIAGNOSIS — E782 Mixed hyperlipidemia: Secondary | ICD-10-CM | POA: Diagnosis not present

## 2020-06-23 DIAGNOSIS — K219 Gastro-esophageal reflux disease without esophagitis: Secondary | ICD-10-CM | POA: Diagnosis not present

## 2020-06-23 DIAGNOSIS — E1165 Type 2 diabetes mellitus with hyperglycemia: Secondary | ICD-10-CM | POA: Diagnosis not present

## 2020-07-19 ENCOUNTER — Other Ambulatory Visit: Payer: Self-pay | Admitting: Cardiology

## 2020-07-21 NOTE — Telephone Encounter (Signed)
Prescription refill request for Eliquis received. Indication:atrial fib Last office visit:4/22 Scr:1.85 Age: 60 Weight:149.1 kg  Prescription refilled

## 2020-07-22 ENCOUNTER — Other Ambulatory Visit (INDEPENDENT_AMBULATORY_CARE_PROVIDER_SITE_OTHER): Payer: Self-pay | Admitting: Family Medicine

## 2020-07-22 ENCOUNTER — Other Ambulatory Visit: Payer: Self-pay

## 2020-07-22 ENCOUNTER — Encounter (INDEPENDENT_AMBULATORY_CARE_PROVIDER_SITE_OTHER): Payer: Self-pay | Admitting: Family Medicine

## 2020-07-22 ENCOUNTER — Ambulatory Visit (INDEPENDENT_AMBULATORY_CARE_PROVIDER_SITE_OTHER): Payer: BC Managed Care – PPO | Admitting: Family Medicine

## 2020-07-22 VITALS — BP 113/74 | HR 86 | Temp 97.8°F | Ht 67.0 in | Wt 318.0 lb

## 2020-07-22 DIAGNOSIS — Z0289 Encounter for other administrative examinations: Secondary | ICD-10-CM

## 2020-07-22 DIAGNOSIS — R5383 Other fatigue: Secondary | ICD-10-CM | POA: Diagnosis not present

## 2020-07-22 DIAGNOSIS — I10 Essential (primary) hypertension: Secondary | ICD-10-CM

## 2020-07-22 DIAGNOSIS — E559 Vitamin D deficiency, unspecified: Secondary | ICD-10-CM

## 2020-07-22 DIAGNOSIS — Z1331 Encounter for screening for depression: Secondary | ICD-10-CM

## 2020-07-22 DIAGNOSIS — E785 Hyperlipidemia, unspecified: Secondary | ICD-10-CM

## 2020-07-22 DIAGNOSIS — R0602 Shortness of breath: Secondary | ICD-10-CM

## 2020-07-22 DIAGNOSIS — Z794 Long term (current) use of insulin: Secondary | ICD-10-CM | POA: Diagnosis not present

## 2020-07-22 DIAGNOSIS — E1169 Type 2 diabetes mellitus with other specified complication: Secondary | ICD-10-CM | POA: Diagnosis not present

## 2020-07-22 DIAGNOSIS — Z6841 Body Mass Index (BMI) 40.0 and over, adult: Secondary | ICD-10-CM

## 2020-07-22 HISTORY — DX: Type 2 diabetes mellitus with other specified complication: E11.69

## 2020-07-23 LAB — TSH: TSH: 1.12 u[IU]/mL (ref 0.450–4.500)

## 2020-07-23 LAB — COMPREHENSIVE METABOLIC PANEL
ALT: 17 IU/L (ref 0–32)
AST: 20 IU/L (ref 0–40)
Albumin/Globulin Ratio: 2.4 — ABNORMAL HIGH (ref 1.2–2.2)
Albumin: 4.7 g/dL (ref 3.8–4.8)
Alkaline Phosphatase: 78 IU/L (ref 44–121)
BUN/Creatinine Ratio: 18 (ref 12–28)
BUN: 12 mg/dL (ref 8–27)
Bilirubin Total: 0.2 mg/dL (ref 0.0–1.2)
CO2: 19 mmol/L — ABNORMAL LOW (ref 20–29)
Calcium: 9.6 mg/dL (ref 8.7–10.3)
Chloride: 103 mmol/L (ref 96–106)
Creatinine, Ser: 0.65 mg/dL (ref 0.57–1.00)
Globulin, Total: 2 g/dL (ref 1.5–4.5)
Glucose: 81 mg/dL (ref 65–99)
Potassium: 3.9 mmol/L (ref 3.5–5.2)
Sodium: 140 mmol/L (ref 134–144)
Total Protein: 6.7 g/dL (ref 6.0–8.5)
eGFR: 100 mL/min/{1.73_m2} (ref >59)

## 2020-07-23 LAB — VITAMIN B12: Vitamin B-12: 613 pg/mL (ref 232–1245)

## 2020-07-23 LAB — HEMOGLOBIN A1C
Est. average glucose Bld gHb Est-mCnc: 192 mg/dL
Hgb A1c MFr Bld: 8.3 % — ABNORMAL HIGH (ref 4.8–5.6)

## 2020-07-23 LAB — FOLATE: Folate: >20 ng/mL (ref >3.0)

## 2020-07-23 LAB — LIPID PANEL WITH LDL/HDL RATIO
Cholesterol, Total: 197 mg/dL (ref 100–199)
HDL: 41 mg/dL (ref >39)
LDL Chol Calc (NIH): 127 mg/dL — ABNORMAL HIGH (ref 0–99)
LDL/HDL Ratio: 3.1 ratio (ref 0.0–3.2)
Triglycerides: 165 mg/dL — ABNORMAL HIGH (ref 0–149)
VLDL Cholesterol Cal: 29 mg/dL (ref 5–40)

## 2020-07-23 LAB — CBC WITH DIFFERENTIAL
Basophils Absolute: 0 10*3/uL (ref 0.0–0.2)
Basos: 0 %
EOS (ABSOLUTE): 0.1 10*3/uL (ref 0.0–0.4)
Eos: 1 %
Hematocrit: 35.9 % (ref 34.0–46.6)
Hemoglobin: 11.7 g/dL (ref 11.1–15.9)
Immature Grans (Abs): 0.2 10*3/uL — ABNORMAL HIGH (ref 0.0–0.1)
Immature Granulocytes: 1 %
Lymphocytes Absolute: 1.7 10*3/uL (ref 0.7–3.1)
Lymphs: 13 %
MCH: 29.9 pg (ref 26.6–33.0)
MCHC: 32.6 g/dL (ref 31.5–35.7)
MCV: 92 fL (ref 79–97)
Monocytes Absolute: 0.9 10*3/uL (ref 0.1–0.9)
Monocytes: 7 %
Neutrophils Absolute: 10.8 10*3/uL — ABNORMAL HIGH (ref 1.4–7.0)
Neutrophils: 78 %
RBC: 3.91 x10E6/uL (ref 3.77–5.28)
RDW: 13.2 % (ref 11.7–15.4)
WBC: 13.7 10*3/uL — ABNORMAL HIGH (ref 3.4–10.8)

## 2020-07-23 LAB — T4: T4, Total: 7.6 ug/dL (ref 4.5–12.0)

## 2020-07-23 LAB — INSULIN, RANDOM: INSULIN: 26.4 u[IU]/mL — ABNORMAL HIGH (ref 2.6–24.9)

## 2020-07-23 LAB — T3: T3, Total: 135 ng/dL (ref 71–180)

## 2020-07-23 LAB — VITAMIN D 25 HYDROXY (VIT D DEFICIENCY, FRACTURES): Vit D, 25-Hydroxy: 34.8 ng/mL (ref 30.0–100.0)

## 2020-07-28 DIAGNOSIS — M150 Primary generalized (osteo)arthritis: Secondary | ICD-10-CM | POA: Diagnosis not present

## 2020-07-29 NOTE — Progress Notes (Signed)
Chief Complaint:   OBESITY Suzanne Carey (MR# [id number]) is a pleasant 60 y.o. female who present to work on weight loss to improve her health. She has many cardiopulmonary conditions such  as Afib, congestive heart failure, pulmonary hypertension, angina, and carotic artery stenosis. She understands that working on healthy weight loss will help improve many of her health issues, and will decrease the risk of additional health problems. Current BMI is Body mass index is 49.81 kg/m. Suzanne Carey has been struggling with her weight for many years and has been unsuccessful in either losing weight, maintaining weight loss, or reaching her healthy weight goal.  Suzanne Carey is currently in the action stage of change and ready to dedicate time achieving and maintaining a healthier weight. Suzanne Carey is interested in becoming our patient and working on intensive lifestyle modifications including (but not limited to) diet and exercise for weight loss.  Suzanne Carey's habits were reviewed today and are as follows: Her family eats meals together, she thinks her family will eat healthier with her, her desired weight loss is 118 lbs, she has been heavy most of her life, she started gaining weight in 2001, her heaviest weight ever was 360 pounds, she has significant food cravings issues, she skips meals frequently, she frequently eats larger portions than normal, and she struggles with emotional eating.  Depression Screen Suzanne Carey's Food and Mood (modified PHQ-9) score was 8.  Depression screen PHQ 2/9 07/22/2020  Decreased Interest 1  Down, Depressed, Hopeless 1  PHQ - 2 Score 2  Altered sleeping 0  Tired, decreased energy 0  Change in appetite 2  Feeling bad or failure about yourself  0  Trouble concentrating 0  Moving slowly or fidgety/restless 0  Suicidal thoughts 0  PHQ-9 Score 4   Subjective:   1. Other fatigue Suzanne Carey admits to daytime somnolence and admits to waking up still tired. Patent has a history of  symptoms of daytime fatigue. Suzanne Carey generally gets 6 hours of sleep per night, and states that she has generally restful sleep. Snoring is present. Apneic episodes are present. Epworth Sleepiness Score is 4.  2. SOB (shortness of breath) on exertion Suzanne Carey notes increasing shortness of breath with exercising and seems to be worsening over time with weight gain. She notes getting out of breath sooner with activity than she used to. This has not gotten worse recently. Suzanne Carey denies shortness of breath at rest or orthopnea.  3. Type 2 diabetes mellitus with other specified complication, without long-term current use of insulin (HCC) Suzanne Carey has no recent A1c in Epic. She is on Victoza and Actos. She has no BGs log with her today. She is ready to work on diet and exercise.  4. Hyperlipidemia associated with type 2 diabetes mellitus (HCC) Suzanne Carey started Lipitor and then had bilateral lower extremity edema and cellulitis. She is not on a statin now but she is ready to work on diet and weight loss.  5. Essential hypertension Suzanne Carey's blood pressure is well controlled on her medications, and she denies chest pain.  6. Vitamin D deficiency Suzanne Carey has a history of Vit D deficiency. She is due for labs and she notes fatigue.  Assessment/Plan:   1. Other fatigue Suzanne Carey does feel that her weight is causing her energy to be lower than it should be. Fatigue may be related to obesity, depression or many other causes. Labs will be ordered, and in the meanwhile, Suzanne Carey will focus on self care including making healthy food choices, increasing  physical activity and focusing on stress reduction.  - TSH - Folate - T3 - T4 - CBC With Differential - EKG 12-Lead  2. SOB (shortness of breath) on exertion Suzanne Carey does feel that she gets out of breath more easily that she used to when she exercises. Suzanne Carey's shortness of breath appears to be obesity related and exercise induced. She has agreed to work on weight loss and  gradually increase exercise to treat her exercise induced shortness of breath. Will continue to monitor closely.  3. Type 2 diabetes mellitus with other specified complication, without long-term current use of insulin (HCC) We will check labs today, and Suzanne Carey will start her Category 3 plan. Good blood sugar control is important to decrease the likelihood of diabetic complications such as nephropathy, neuropathy, limb loss, blindness, coronary artery disease, and death. Intensive lifestyle modification including diet, exercise and weight loss are the first line of treatment for diabetes.   - Insulin, random - Hemoglobin A1c - Vitamin B12  4. Hyperlipidemia associated with type 2 diabetes mellitus (HCC) Cardiovascular risk and specific lipid/LDL goals reviewed. We discussed several lifestyle modifications today. We will check labs today. Suzanne Carey will start her Category 3 plan, and will work on exercise and weight loss efforts. Orders and follow up as documented in patient record.   - Lipid Panel With LDL/HDL Ratio  5. Essential hypertension Suzanne Carey will work on healthy weight loss and exercise to improve blood pressure control. We will watch for signs of hypotension as she continues her lifestyle modifications. We will check labs today.  - Comprehensive metabolic panel  6. Vitamin D deficiency Low Vitamin D level contributes to fatigue and are associated with obesity, breast, and colon cancer. We will check labs today. Suzanne Carey will follow-up for routine testing of Vitamin D, at least 2-3 times per year to avoid over-replacement.  - VITAMIN D 25 Hydroxy (Vit-D Deficiency, Fractures)  7. Screening for depression Suzanne Carey had a positive depression screening. Depression is commonly associated with obesity and often results in emotional eating behaviors. We will monitor this closely and work on CBT to help improve the non-hunger eating patterns. Referral to Psychology may be required if no improvement is  seen as she continues in our clinic.  8. Obesity with current BMI 49.9 Suzanne Carey is currently in the action stage of change and her goal is to continue with weight loss efforts. I recommend Suzanne Carey begin the structured treatment plan as follows:  She has agreed to the Category 3 Plan.  Exercise goals: No exercise has been prescribed for now, while we concentrate on nutritional changes.  Behavioral modification strategies: decreasing sodium intake, no skipping meals, and meal planning and cooking strategies.  She was informed of the importance of frequent follow-up visits to maximize her success with intensive lifestyle modifications for her multiple health conditions. She was informed we would discuss her lab results at her next visit unless there is a critical issue that needs to be addressed sooner. Suzanne Carey agreed to keep her next visit at the agreed upon time to discuss these results.  Objective:   Blood pressure 113/74, pulse 86, temperature 97.8 F (36.6 C), height 5\' 7"  (1.702 m), weight (!) 318 lb (144.2 kg), SpO2 98 %. Body mass index is 49.81 kg/m.  EKG: Normal sinus rhythm, rate 92 BPM.  Indirect Calorimeter completed today shows a VO2 of 333 and a REE of 2315.  Her calculated basal metabolic rate is thus her basal metabolic rate is better than expected.  General: Cooperative, alert, well developed, in no acute distress. HEENT: Conjunctivae and lids unremarkable. Cardiovascular: Regular rhythm.  Lungs: Normal work of breathing. Neurologic: No focal deficits.   Lab Results  Component Value Date   CREATININE 0.65 07/22/2020   BUN 12 07/22/2020   NA 140 07/22/2020   K 3.9 07/22/2020   CL 103 07/22/2020   CO2 19 (L) 07/22/2020   Lab Results  Component Value Date   ALT 17 07/22/2020   AST 20 07/22/2020   ALKPHOS 78 07/22/2020   BILITOT 0.2 07/22/2020   Lab Results  Component Value Date   HGBA1C 8.3 (H) 07/22/2020   Lab Results  Component Value Date   INSULIN 26.4  (H) 07/22/2020   Lab Results  Component Value Date   TSH 1.120 07/22/2020   Lab Results  Component Value Date   CHOL 197 07/22/2020   HDL 41 07/22/2020   LDLCALC 127 (H) 07/22/2020   TRIG 165 (H) 07/22/2020   CHOLHDL 4.4 03/28/2019   Lab Results  Component Value Date   WBC 13.7 (H) 07/22/2020   HGB 11.7 07/22/2020   HCT 35.9 07/22/2020   MCV 92 07/22/2020   PLT 322 03/28/2019   No results found for: IRON, TIBC, FERRITIN  Attestation Statements:   Reviewed by clinician on day of visit: allergies, medications, problem list, medical history, surgical history, family history, social history, and previous encounter notes.  Time spent on visit including pre-visit chart review and post-visit charting and care was 66 minutes.    I, Burt Knack, am acting as transcriptionist for Quillian Quince, MD.  I have reviewed the above documentation for accuracy and completeness, and I agree with the above. - Quillian Quince, MD

## 2020-07-30 ENCOUNTER — Other Ambulatory Visit: Payer: Self-pay | Admitting: Cardiology

## 2020-08-05 ENCOUNTER — Other Ambulatory Visit: Payer: Self-pay

## 2020-08-05 ENCOUNTER — Ambulatory Visit (INDEPENDENT_AMBULATORY_CARE_PROVIDER_SITE_OTHER): Payer: BC Managed Care – PPO | Admitting: Family Medicine

## 2020-08-05 ENCOUNTER — Encounter (INDEPENDENT_AMBULATORY_CARE_PROVIDER_SITE_OTHER): Payer: Self-pay | Admitting: Family Medicine

## 2020-08-05 VITALS — BP 129/72 | HR 81 | Temp 98.1°F | Ht 67.0 in | Wt 311.0 lb

## 2020-08-05 DIAGNOSIS — L03116 Cellulitis of left lower limb: Secondary | ICD-10-CM | POA: Diagnosis not present

## 2020-08-05 DIAGNOSIS — Z6841 Body Mass Index (BMI) 40.0 and over, adult: Secondary | ICD-10-CM

## 2020-08-05 DIAGNOSIS — E559 Vitamin D deficiency, unspecified: Secondary | ICD-10-CM | POA: Diagnosis not present

## 2020-08-05 DIAGNOSIS — E1169 Type 2 diabetes mellitus with other specified complication: Secondary | ICD-10-CM

## 2020-08-06 DIAGNOSIS — U071 COVID-19: Secondary | ICD-10-CM | POA: Diagnosis not present

## 2020-08-06 DIAGNOSIS — B974 Respiratory syncytial virus as the cause of diseases classified elsewhere: Secondary | ICD-10-CM | POA: Diagnosis not present

## 2020-08-06 DIAGNOSIS — Z20828 Contact with and (suspected) exposure to other viral communicable diseases: Secondary | ICD-10-CM | POA: Diagnosis not present

## 2020-08-06 DIAGNOSIS — J101 Influenza due to other identified influenza virus with other respiratory manifestations: Secondary | ICD-10-CM | POA: Diagnosis not present

## 2020-08-08 ENCOUNTER — Ambulatory Visit: Payer: BC Managed Care – PPO | Admitting: Cardiology

## 2020-08-12 NOTE — Progress Notes (Signed)
Chief Complaint:   OBESITY Suzanne Carey is here to discuss her progress with her obesity treatment plan along with follow-up of her obesity related diagnoses. Suzanne Carey is on the Category 3 Plan and states she is following her eating plan approximately 100% of the time. Suzanne Carey states she is doing 0 minutes 0 times per week.  Today's visit was #: 2 Starting weight: 318 lbs Starting date: 07/22/2020 Today's weight: 311 lbs Today's date: 08/05/2020 Total lbs lost to date: 7 Total lbs lost since last in-office visit: 7  Interim History: Suzanne Carey has done very well with weight loss on her plan. Her hunger was mostly controlled, and she did well with meal planning and prepping.  Subjective:   1. Type 2 diabetes mellitus with other specified complication, without long-term current use of insulin (HCC) Suzanne Carey is on Victoza and Actos. Her A1c is elevated to 8.3 but she has been doing very well with her eating plan and weight loss. I discussed labs with the patient today.  2. Vitamin D deficiency Suzanne Carey is on Vit D prescription 2 times weekly, but her level is still not at goal. She notes fatigue. I discussed labs with the patient today.  3. Left leg cellulitis Suzanne Carey is followed by her primary care physician, and she notes improvement with z-pack. She is now on Doxycycline and clindamycin. Her leg is warm, tight, and weeping serious fluid and is tender to the touch.  Assessment/Plan:   1. Type 2 diabetes mellitus with other specified complication, without long-term current use of insulin (HCC) Suzanne Carey will continue Victoza at 1.8 mg and we will refill for 1 month, and she will continue Actos. Good blood sugar control is important to decrease the likelihood of diabetic complications such as nephropathy, neuropathy, limb loss, blindness, coronary artery disease, and death. Intensive lifestyle modification including diet, exercise and weight loss are the first line of treatment for diabetes.   2. Vitamin D  deficiency Low Vitamin D level contributes to fatigue and are associated with obesity, breast, and colon cancer. Suzanne Carey will continue taking prescription Vitamin D 50,000 IU every q 3 days, and will start OTC Vit D3 at 2,000 IU daily. We will recheck labs in 3 months, and she will follow-up for routine testing of Vitamin D, at least 2-3 times per year to avoid over-replacement.  3. Left leg cellulitis Suzanne Carey will finish her antibiotics, and it is ok to wrap at night to keep her sheets clean. She will continue with her eating plan to improve her healing.  4. Obesity with current BMI 48.8 Suzanne Carey is currently in the action stage of change. As such, her goal is to continue with weight loss efforts. She has agreed to the Category 3 Plan with lunch options.   Behavioral modification strategies: increasing lean protein intake and meal planning and cooking strategies.  Suzanne Carey has agreed to follow-up with our clinic in 2 weeks. She was informed of the importance of frequent follow-up visits to maximize her success with intensive lifestyle modifications for her multiple health conditions.   Objective:   Blood pressure 129/72, pulse 81, temperature 98.1 F (36.7 C), height 5\' 7"  (1.702 m), weight (!) 311 lb (141.1 kg), SpO2 96 %. Body mass index is 48.71 kg/m.  General: Cooperative, alert, well developed, in no acute distress. HEENT: Conjunctivae and lids unremarkable. Cardiovascular: Regular rhythm.  Lungs: Normal work of breathing. Neurologic: No focal deficits.   Lab Results  Component Value Date   CREATININE 0.65 07/22/2020  BUN 12 07/22/2020   NA 140 07/22/2020   K 3.9 07/22/2020   CL 103 07/22/2020   CO2 19 (L) 07/22/2020   Lab Results  Component Value Date   ALT 17 07/22/2020   AST 20 07/22/2020   ALKPHOS 78 07/22/2020   BILITOT 0.2 07/22/2020   Lab Results  Component Value Date   HGBA1C 8.3 (H) 07/22/2020   Lab Results  Component Value Date   INSULIN 26.4 (H) 07/22/2020    Lab Results  Component Value Date   TSH 1.120 07/22/2020   Lab Results  Component Value Date   CHOL 197 07/22/2020   HDL 41 07/22/2020   LDLCALC 127 (H) 07/22/2020   TRIG 165 (H) 07/22/2020   CHOLHDL 4.4 03/28/2019   Lab Results  Component Value Date   VD25OH 34.8 07/22/2020   Lab Results  Component Value Date   WBC 13.7 (H) 07/22/2020   HGB 11.7 07/22/2020   HCT 35.9 07/22/2020   MCV 92 07/22/2020   PLT 322 03/28/2019   No results found for: IRON, TIBC, FERRITIN  Attestation Statements:   Reviewed by clinician on day of visit: allergies, medications, problem list, medical history, surgical history, family history, social history, and previous encounter notes.  Time spent on visit including pre-visit chart review and post-visit care and charting was 45 minutes.    I, Burt Knack, am acting as transcriptionist for Quillian Quince, MD.  I have reviewed the above documentation for accuracy and completeness, and I agree with the above. -  Quillian Quince, MD

## 2020-08-14 MED ORDER — VICTOZA 18 MG/3ML ~~LOC~~ SOPN: PEN_INJECTOR | SUBCUTANEOUS | 0 refills | Status: DC

## 2020-09-01 ENCOUNTER — Other Ambulatory Visit: Payer: Self-pay

## 2020-09-01 ENCOUNTER — Encounter (INDEPENDENT_AMBULATORY_CARE_PROVIDER_SITE_OTHER): Payer: Self-pay | Admitting: Family Medicine

## 2020-09-01 ENCOUNTER — Ambulatory Visit (INDEPENDENT_AMBULATORY_CARE_PROVIDER_SITE_OTHER): Payer: BC Managed Care – PPO | Admitting: Family Medicine

## 2020-09-01 VITALS — BP 105/61 | HR 94 | Temp 97.6°F | Ht 67.0 in | Wt 304.0 lb

## 2020-09-01 DIAGNOSIS — Z6841 Body Mass Index (BMI) 40.0 and over, adult: Secondary | ICD-10-CM | POA: Diagnosis not present

## 2020-09-01 DIAGNOSIS — E1169 Type 2 diabetes mellitus with other specified complication: Secondary | ICD-10-CM

## 2020-09-01 DIAGNOSIS — K5900 Constipation, unspecified: Secondary | ICD-10-CM | POA: Diagnosis not present

## 2020-09-05 NOTE — Progress Notes (Signed)
Chief Complaint:   OBESITY Suzanne Carey is here to discuss her progress with her obesity treatment plan along with follow-up of her obesity related diagnoses. Suzanne Carey is on the Category 3 Plan with lunch options and states she is following her eating plan approximately 100% of the time. Suzanne Carey states she is doing 0 minutes 0 times per week.  Today's visit was #: 3 Starting weight: 318 lbs Starting date: 07/22/2020 Today's weight: 304 lbs Today's date: 09/01/2020 Total lbs lost to date: 14 Total lbs lost since last in-office visit: 7  Interim History: Suzanne Carey continues to do well with weight loss on her eating plan. She is getting bored with breakfast and would like more options. She had been sick 2 week ago with a positive flu test but she has gotten back on her plan recently.  Subjective:   1. Type 2 diabetes mellitus with other specified complication, without long-term current use of insulin (HCC) Suzanne Carey is on Actos and Victoza, and she is doing well on diet and weight loss. She is ready to start exercise. She denies signs of hypoglycemia.  2. Constipation, unspecified constipation type Suzanne Carey had diarrhea when she had the flu, but now she is having constipation. She would like to avoid taking more medications.  Assessment/Plan:   1. Type 2 diabetes mellitus with other specified complication, without long-term current use of insulin [facility]) Suzanne Carey will continue her medications and diet, and she will add exercise and will follow up as directed. Good blood sugar control is important to decrease the likelihood of diabetic complications such as nephropathy, neuropathy, limb loss, blindness, coronary artery disease, and death. Intensive lifestyle modification including diet, exercise and weight loss are the first line of treatment for diabetes.   2. Constipation, unspecified constipation type Suzanne Carey was informed that a decrease in bowel movement frequency is normal while losing weight, but stools  should not be hard or painful. She is to increase her water intake and exercise and fiber, and wil continue to monitor. Orders and follow up as documented in patient record.   3. Obesity with current BMI 47.6 Suzanne Carey is currently in the action stage of change. As such, her goal is to continue with weight loss efforts. She has agreed to the Category 3 Plan with breakfast and lunch options.   Exercise goals: Start pool exercises.  Behavioral modification strategies: increasing lean protein intake and increasing water intake.  Suzanne Carey has agreed to follow-up with our clinic in 2 to 3 weeks. She was informed of the importance of frequent follow-up visits to maximize her success with intensive lifestyle modifications for her multiple health conditions.   Objective:   Blood pressure 105/61, pulse 94, temperature 97.6 F (36.4 C), height 5\' 7"  (1.702 m), weight (!) 304 lb (137.9 kg), SpO2 97 %. Body mass index is 47.61 kg/m.  General: Cooperative, alert, well developed, in no acute distress. HEENT: Conjunctivae and lids unremarkable. Cardiovascular: Regular rhythm.  Lungs: Normal work of breathing. Neurologic: No focal deficits.   Lab Results  Component Value Date   CREATININE 0.65 07/22/2020   BUN 12 07/22/2020   NA 140 07/22/2020   K 3.9 07/22/2020   CL 103 07/22/2020   CO2 19 (L) 07/22/2020   Lab Results  Component Value Date   ALT 17 07/22/2020   AST 20 07/22/2020   ALKPHOS 78 07/22/2020   BILITOT 0.2 07/22/2020   Lab Results  Component Value Date   HGBA1C 8.3 (H) 07/22/2020   Lab Results  Component Value Date   INSULIN 26.4 (H) 07/22/2020   Lab Results  Component Value Date   TSH 1.120 07/22/2020   Lab Results  Component Value Date   CHOL 197 07/22/2020   HDL 41 07/22/2020   LDLCALC 127 (H) 07/22/2020   TRIG 165 (H) 07/22/2020   CHOLHDL 4.4 03/28/2019   Lab Results  Component Value Date   VD25OH 34.8 07/22/2020   Lab Results  Component Value Date   WBC 13.7  (H) 07/22/2020   HGB 11.7 07/22/2020   HCT 35.9 07/22/2020   MCV 92 07/22/2020   PLT 322 03/28/2019   No results found for: IRON, TIBC, FERRITIN  Attestation Statements:   Reviewed by clinician on day of visit: allergies, medications, problem list, medical history, surgical history, family history, social history, and previous encounter notes.  Time spent on visit including pre-visit chart review and post-visit care and charting was 32 minutes.    I, Burt Knack, am acting as transcriptionist for Quillian Quince, MD.  I have reviewed the above documentation for accuracy and completeness, and I agree with the above. -  Quillian Quince, MD

## 2020-09-10 ENCOUNTER — Encounter: Payer: Self-pay | Admitting: Cardiology

## 2020-09-10 ENCOUNTER — Ambulatory Visit: Payer: BC Managed Care – PPO | Admitting: Cardiology

## 2020-09-10 ENCOUNTER — Other Ambulatory Visit: Payer: Self-pay

## 2020-09-10 VITALS — BP 112/74 | HR 92 | Ht 67.0 in | Wt 304.1 lb

## 2020-09-10 DIAGNOSIS — E782 Mixed hyperlipidemia: Secondary | ICD-10-CM

## 2020-09-10 DIAGNOSIS — I10 Essential (primary) hypertension: Secondary | ICD-10-CM

## 2020-09-10 DIAGNOSIS — I482 Chronic atrial fibrillation, unspecified: Secondary | ICD-10-CM

## 2020-09-10 DIAGNOSIS — I272 Pulmonary hypertension, unspecified: Secondary | ICD-10-CM

## 2020-09-10 DIAGNOSIS — I480 Paroxysmal atrial fibrillation: Secondary | ICD-10-CM

## 2020-09-10 MED ORDER — DIGOXIN 125 MCG PO TABS: 0.1250 mg | ORAL_TABLET | Freq: Every day | ORAL | 3 refills | Status: DC

## 2020-09-10 MED ORDER — FUROSEMIDE 40 MG PO TABS: 40.0000 mg | ORAL_TABLET | Freq: Two times a day (BID) | ORAL | 3 refills | Status: DC

## 2020-09-10 NOTE — Patient Instructions (Addendum)
Medication Instructions:  Your physician recommends that you continue on your current medications as directed. Please refer to the Current Medication list given to you today. Refilled lasix and Digoxin.  *If you need a refill on your cardiac medications before your next appointment, please call your pharmacy*   Lab Work: Your physician recommends that you return for lab work in: TODAY: Digoxin level If you have labs (blood work) drawn today and your tests are completely normal, you will receive your results only by: MyChart Message (if you have MyChart) OR A paper copy in the mail If you have any lab test that is abnormal or we need to change your treatment, we will call you to review the results.   Testing/Procedures: None   Follow-Up: At [facility], you and your health needs are our priority.  As part of our continuing mission to provide you with exceptional heart care, we have created designated Provider Care Teams.  These Care Teams include your primary Cardiologist (physician) and Advanced Practice Providers (APPs -  Physician Assistants and Nurse Practitioners) who all work together to provide you with the care you need, when you need it.  We recommend signing up for the patient portal called "MyChart".  Sign up information is provided on this After Visit Summary.  MyChart is used to connect with patients for Virtual Visits (Telemedicine).  Patients are able to view lab/test results, encounter notes, upcoming appointments, etc.  Non-urgent messages can be sent to your provider as well.   To learn more about what you can do with MyChart, go to [email or web address].    Your next appointment:   6 month(s)  The format for your next appointment:   In Person    Other Instructions

## 2020-09-10 NOTE — Progress Notes (Signed)
Cardiology Office Note:    Date:  09/11/2020   ID:  Suzanne Carey, DOB May 24, 1960, MRN [id number]  PCP:  Galvin Proffer, MD  Cardiologist:  Thomasene Ripple, DO  Electrophysiologist:  None   Referring MD: Galvin Proffer, MD   I am doing fine  History of Present Illness:    Suzanne Carey is a 60 y.o. female with a hx of of atrial fibrillation on Eliquis, hypertension, hyperlipidemia, morbid obesity, diabetes is here today for follow-up visit.   I saw the patient in July 2021 at that time she was in atrial fibrillation but the patient declined DC cardioversion.  We will continue her on metoprolol and Eliquis.   I saw the patient on February 09, 2020 we talked about cardioversion which she also did decline.  We also discussed weight loss and I referred the patient to our healthy wellness program.   When I saw the patient in February  she did have significant volume overload as well as increasing heart rate.  I recommended patient go to the emergency department to be treated for IV Lasix and she declined.  So I started her on Lasix 40 mg twice a day and gave her metolazone 2.5 mg for 3 days.  We had some blood work which showed increased BNP.  Also loaded the patient with digoxin.   She was seen on April 11, 2020 at that time she has lost significant fluid volume.  At that visit I still wanted the patient to be inpatient for IV diuretics but she declined.  Therefore we kept her on the Lasix 40 mg twice a day and also gave her metolazone 5 mg for 7 days.  I saw the patient on May 12, 2020 we kept her on her Lasix twice a day.  I also recommended that she see our healthy wellness program.  Since I saw the patient she has started the healthy weight and wellness medical weight management program.  He is very happy with this as she has lost 14 pounds since April.  No other complaints at this time.  Past Medical History:  Diagnosis Date   Anxiety disorder    Atrial fibrillation (HCC)  03/28/2019   Chronic atrial fibrillation, unspecified (HCC) 03/28/2019   Depressed left ventricular ejection fraction 03/28/2019   Diabetes mellitus (HCC) 03/28/2019   Dietary counseling and surveillance 03/28/2019   Essential hypertension    Gastro-esophageal reflux disease without esophagitis    Generalized anxiety disorder    GERD (gastroesophageal reflux disease)    Hypercholesterolemia 03/17/2020   Hyperglycemia due to type 2 diabetes mellitus (HCC)    Hypertensive disorder 03/17/2020   Hypothyroidism    Mixed hyperlipidemia    Morbid obesity (HCC) 03/27/2019   Non-toxic multinodular goiter    Nontoxic multinodular goiter    Occlusion and stenosis of bilateral carotid arteries 03/31/2020   Other long term (current) drug therapy 02/15/2020   Other specified hypothyroidism    Other vitamin B12 deficiency anemias    Plantar fasciitis 12/17/2019   Polyp at cervical os 03/27/2019   Primary generalized (osteo)arthritis    Pulmonary hypertension (HCC) 03/28/2019   Shortness of breath 04/02/2020   Tightness of heel cord, left 12/17/2019   Type 2 diabetes mellitus with hyperglycemia (HCC)    Type 2 diabetes mellitus without complication, with long-term current use of insulin (HCC) 03/28/2019   Unstable angina (HCC)    Vitamin D deficiency     Past Surgical History:  Procedure Laterality Date  CESAREAN SECTION  2002   CHOLECYSTECTOMY  1994    Current Medications: Current Meds  Medication Sig   ALPRAZolam (XANAX) 0.5 MG tablet Take 0.5 mg by mouth 3 (three) times daily as needed for anxiety or sleep.   Cholecalciferol (VITAMIN D) 50 MCG (2000 UT) tablet Take 2,000 Units by mouth daily.   ELIQUIS 5 MG TABS tablet TAKE 1 TABLET BY MOUTH TWICE A DAY   ergocalciferol (VITAMIN D2) 1.25 MG (50000 UT) capsule ergocalciferol (vitamin D2) 1,250 mcg (50,000 unit) capsule  TAKE 1 CAPSULE BY MOUTH TWICE WEEKLY   hydrochlorothiazide (HYDRODIURIL) 25 MG tablet TAKE 1 TABLET BY MOUTH EVERY  DAY   liraglutide (VICTOZA) 18 MG/3ML SOPN Victoza 3-Pak 0.6 mg/0.1 mL (18 mg/3 mL) subcutaneous pen injector  INJECT 1.8 MG ONCE A DAY SUBCUTANEOUSLY 30 DAYS   metoprolol succinate (TOPROL-XL) 25 MG 24 hr tablet TAKE 1 TABLET BY MOUTH EVERY DAY   nitroGLYCERIN (NITROSTAT) 0.4 MG SL tablet Place under the tongue.   olmesartan (BENICAR) 20 MG tablet Take 1 tablet (20 mg total) by mouth daily.   omeprazole (PRILOSEC) 40 MG capsule Take 40 mg by mouth daily.   pioglitazone (ACTOS) 30 MG tablet Take 30 mg by mouth daily.   [DISCONTINUED] digoxin (LANOXIN) 0.125 MG tablet Take 1 tablet (0.125 mg total) by mouth daily.   [DISCONTINUED] furosemide (LASIX) 40 MG tablet Take 40 mg by mouth 2 (two) times daily.     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  Tobacco Use   Smoking status: Never   Smokeless tobacco: Never  Vaping Use   Vaping Use: Never used  Substance and Sexual Activity   Alcohol use: Never   Drug use: Never   Sexual activity: Not on file  Other Topics Concern   Not on file  Social History Narrative   Not on file   Social Determinants of Health   Financial Resource Strain: Not on file  Food Insecurity: Not on file  Transportation Needs: Not on file  Physical Activity: Not on file  Stress: Not on file  Social Connections: Not on file     Family History: The patient's family history includes Breast cancer (age of onset: 5850) in her cousin and paternal aunt; Cancer in her brother; Diabetes in her mother; Heart disease in her mother and sister; Heart failure in her mother; Hypertension in her mother and sister; Kidney disease in her mother; Multiple sclerosis in her sister; Obesity in  her mother; Stroke in her mother; Thyroid disease in her mother.  ROS:   Review of Systems  Constitution: Negative for decreased appetite, fever and weight gain.  HENT: Negative for congestion, ear discharge, hoarse voice and sore throat.   Eyes: Negative for discharge, redness, vision loss in right eye and visual halos.  Cardiovascular: Negative for chest pain, dyspnea on exertion, leg swelling, orthopnea and palpitations.  Respiratory: Negative for cough, hemoptysis, shortness of breath and snoring.   Endocrine: Negative for heat intolerance and polyphagia.  Hematologic/Lymphatic: Negative for bleeding problem. Does not bruise/bleed easily.  Skin: Negative for flushing, nail changes, rash and suspicious lesions.  Musculoskeletal: Negative for arthritis, joint pain,  muscle cramps, myalgias, neck pain and stiffness.  Gastrointestinal: Negative for abdominal pain, bowel incontinence, diarrhea and excessive appetite.  Genitourinary: Negative for decreased libido, genital sores and incomplete emptying.  Neurological: Negative for brief paralysis, focal weakness, headaches and loss of balance.  Psychiatric/Behavioral: Negative for altered mental status, depression and suicidal ideas.  Allergic/Immunologic: Negative for HIV exposure and persistent infections.    EKGs/Labs/Other Studies Reviewed:    The following studies were reviewed today:   EKG: None today  echocardiogram for the patient PCP office which showed EF 55 to 60%.  Moderately dilated left atrium.  Moderately dilated right atrium.  Aortic valve sclerosis with no stenosis.  Mild mitral regurgitation.  Tricuspid valve with mild regurgitation.  Right ventricle systolic pressure 40-45 mm consistent with mild pulmonary hypertension.  Compared to prior echocardiogram there has been some improvement in March 21, 2019 EF was 45 to 50%.   Recent Labs: 04/03/2020: NT-Pro BNP 8,165 05/12/2020: Magnesium 1.5 07/22/2020: ALT 17; BUN 12;  Creatinine, Ser 0.65; Hemoglobin 11.7; Potassium 3.9; Sodium 140; TSH 1.120  Recent Lipid Panel    Component Value Date/Time   CHOL 197 07/22/2020 1626   TRIG 165 (H) 07/22/2020 1626   HDL 41 07/22/2020 1626   CHOLHDL 4.4 03/28/2019 1136   LDLCALC 127 (H) 07/22/2020 1626    Physical Exam:    VS:  BP 112/74 (BP Location: Left Wrist, Patient Position: Sitting)   Pulse 92   Ht 5\' 7"  (1.702 m)   Wt (!) 304 lb 1.9 oz (137.9 kg)   SpO2 97%   BMI 47.63 kg/m     Wt Readings from Last 3 Encounters:  09/10/20 (!) 304 lb 1.9 oz (137.9 kg)  09/01/20 (!) 304 lb (137.9 kg)  08/05/20 (!) 311 lb (141.1 kg)     GEN: Well nourished, well developed in no acute distress HEENT: Normal NECK: No JVD; No carotid bruits LYMPHATICS: No lymphadenopathy CARDIAC: S1S2 noted,RRR, no murmurs, rubs, gallops RESPIRATORY:  Clear to auscultation without rales, wheezing or rhonchi  ABDOMEN: Soft, non-tender, non-distended, +bowel sounds, no guarding. EXTREMITIES: No edema, No cyanosis, no clubbing MUSCULOSKELETAL:  No deformity  SKIN: Warm and dry NEUROLOGIC:  Alert and oriented x 3, non-focal PSYCHIATRIC:  Normal affect, good insight  ASSESSMENT:    1. PAF (paroxysmal atrial fibrillation) (HCC)   2. Hypertension, unspecified type   3. Chronic atrial fibrillation, unspecified (HCC)   4. Pulmonary hypertension (HCC)   5. Morbid obesity (HCC)   6. Mixed hyperlipidemia    PLAN:     She is doing well from a cardiovascular standpoint.  No changes will be made to her antihypertensive medication today. She is euvolemic we will continue her current Lasix dose of 40 mg twice daily. She is happy with the care she is getting at the healthy wellness program.  She has lost 14 pounds since today she started.  She is excited about this. Will get digoxin level today.  Her rate appears to be controlled.  Continue her Eliquis 5 mg twice a day as well.  The patient is in agreement with the above plan. The patient  left the office in stable condition.  The patient will follow up in expends or sooner if needed.   Medication Adjustments/Labs and Tests Ordered: Current medicines are reviewed at length with the patient today.  Concerns regarding medicines are outlined above.  Orders Placed This Encounter  Procedures   Digoxin level   Meds ordered this encounter  Medications   furosemide (  LASIX) 40 MG tablet    Sig: Take 1 tablet (40 mg total) by mouth 2 (two) times daily.    Dispense:  180 tablet    Refill:  3   digoxin (LANOXIN) 0.125 MG tablet    Sig: Take 1 tablet (0.125 mg total) by mouth daily.    Dispense:  90 tablet    Refill:  3    Patient Instructions  Medication Instructions:  Your physician recommends that you continue on your current medications as directed. Please refer to the Current Medication list given to you today. Refilled lasix and Digoxin.  *If you need a refill on your cardiac medications before your next appointment, please call your pharmacy*   Lab Work: Your physician recommends that you return for lab work in: TODAY: Digoxin level If you have labs (blood work) drawn today and your tests are completely normal, you will receive your results only by: MyChart Message (if you have MyChart) OR A paper copy in the mail If you have any lab test that is abnormal or we need to change your treatment, we will call you to review the results.   Testing/Procedures: None   Follow-Up: At [facility], you and your health needs are our priority.  As part of our continuing mission to provide you with exceptional heart care, we have created designated Provider Care Teams.  These Care Teams include your primary Cardiologist (physician) and Advanced Practice Providers (APPs -  Physician Assistants and Nurse Practitioners) who all work together to provide you with the care you need, when you need it.  We recommend signing up for the patient portal called "MyChart".  Sign up  information is provided on this After Visit Summary.  MyChart is used to connect with patients for Virtual Visits (Telemedicine).  Patients are able to view lab/test results, encounter notes, upcoming appointments, etc.  Non-urgent messages can be sent to your provider as well.   To learn more about what you can do with MyChart, go to [email or web address].    Your next appointment:   6 month(s)  The format for your next appointment:   In Person    Other Instructions    Adopting a Healthy Lifestyle.  Know what a healthy weight is for you (roughly BMI <25) and aim to maintain this   Aim for 7+ servings of fruits and vegetables daily   65-80+ fluid ounces of water or unsweet tea for healthy kidneys   Limit to max 1 drink of alcohol per day; avoid smoking/tobacco   Limit animal fats in diet for cholesterol and heart health - choose grass fed whenever available   Avoid highly processed foods, and foods high in saturated/trans fats   Aim for low stress - take time to unwind and care for your mental health   Aim for 150 min of moderate intensity exercise weekly for heart health, and weights twice weekly for bone health   Aim for 7-9 hours of sleep daily   When it comes to diets, agreement about the perfect plan isnt easy to find, even among the experts. Experts at the [facility] [employer] developed an idea known as the Healthy Eating Plate. Just imagine a plate divided into logical, healthy portions.   The emphasis is on diet quality:   Load up on vegetables and fruits - one-half of your plate: Aim for color and variety, and remember that potatoes dont count.   Go for whole grains - one-quarter of your plate: Whole wheat,  barley, wheat berries, quinoa, oats, brown rice, and foods made with them. If you want pasta, go with whole wheat pasta.   Protein power - one-quarter of your plate: Fish, chicken, beans, and nuts are all healthy, versatile protein sources. Limit  red meat.   The diet, however, does go beyond the plate, offering a few other suggestions.   Use healthy plant oils, such as olive, canola, soy, corn, sunflower and peanut. Check the labels, and avoid partially hydrogenated oil, which have unhealthy trans fats.   If youre thirsty, drink water. Coffee and tea are good in moderation, but skip sugary drinks and limit milk and dairy products to one or two daily servings.   The type of carbohydrate in the diet is more important than the amount. Some sources of carbohydrates, such as vegetables, fruits, whole grains, and beans-are healthier than others.   Finally, stay active  Signed, Thomasene Ripple, DO  09/11/2020 4:25 PM    [facility]

## 2020-09-11 LAB — DIGOXIN LEVEL: Digoxin, Serum: 0.9 ng/mL (ref 0.5–0.9)

## 2020-09-22 DIAGNOSIS — F419 Anxiety disorder, unspecified: Secondary | ICD-10-CM | POA: Diagnosis not present

## 2020-09-22 DIAGNOSIS — E782 Mixed hyperlipidemia: Secondary | ICD-10-CM | POA: Diagnosis not present

## 2020-09-22 DIAGNOSIS — E038 Other specified hypothyroidism: Secondary | ICD-10-CM | POA: Diagnosis not present

## 2020-09-22 DIAGNOSIS — Z79899 Other long term (current) drug therapy: Secondary | ICD-10-CM | POA: Diagnosis not present

## 2020-09-22 DIAGNOSIS — I10 Essential (primary) hypertension: Secondary | ICD-10-CM | POA: Diagnosis not present

## 2020-09-22 DIAGNOSIS — M150 Primary generalized (osteo)arthritis: Secondary | ICD-10-CM | POA: Diagnosis not present

## 2020-09-22 DIAGNOSIS — E1165 Type 2 diabetes mellitus with hyperglycemia: Secondary | ICD-10-CM | POA: Diagnosis not present

## 2020-09-22 DIAGNOSIS — E559 Vitamin D deficiency, unspecified: Secondary | ICD-10-CM | POA: Diagnosis not present

## 2020-09-22 DIAGNOSIS — D518 Other vitamin B12 deficiency anemias: Secondary | ICD-10-CM | POA: Diagnosis not present

## 2020-09-25 ENCOUNTER — Other Ambulatory Visit: Payer: Self-pay

## 2020-09-25 ENCOUNTER — Encounter (INDEPENDENT_AMBULATORY_CARE_PROVIDER_SITE_OTHER): Payer: Self-pay | Admitting: Family Medicine

## 2020-09-25 ENCOUNTER — Ambulatory Visit (INDEPENDENT_AMBULATORY_CARE_PROVIDER_SITE_OTHER): Payer: BC Managed Care – PPO | Admitting: Family Medicine

## 2020-09-25 VITALS — BP 112/66 | HR 75 | Temp 98.0°F | Ht 67.0 in | Wt 298.0 lb

## 2020-09-25 DIAGNOSIS — Z6841 Body Mass Index (BMI) 40.0 and over, adult: Secondary | ICD-10-CM

## 2020-09-25 DIAGNOSIS — E1169 Type 2 diabetes mellitus with other specified complication: Secondary | ICD-10-CM

## 2020-09-29 NOTE — Progress Notes (Signed)
Chief Complaint:   OBESITY Suzanne Carey is here to discuss her progress with her obesity treatment plan along with follow-up of her obesity related diagnoses. Suzanne Carey is on the Category 3 Plan with breakfast and lunch options and states she is following her eating plan approximately 60% of the time. Suzanne Carey states she is walking for 10 minutes 3 times per week.  Today's visit was #: 4 Starting weight: 318 lbs Starting date: 07/22/2020 Today's weight: 298 lbs Today's date: 09/25/2020 Total lbs lost to date: 20 Total lbs lost since last in-office visit: 6  Interim History: Suzanne Carey continues to do well with weight loss. She is divorcing her husband and she has had a lot of stress and she has sometimes skipped meals due to this stress.  Subjective:   1. Type 2 diabetes mellitus with other specified complication, without long-term current use of insulin (HCC) Suzanne Carey continues to do well with weight loss. She denies signs of hypoglycemia. She has questions about fruit and sugar content.  Assessment/Plan:   1. Type 2 diabetes mellitus with other specified complication, without long-term current use of insulin (HCC) Suzanne Carey was educated on sugar content and glycemic index of various fruit, and smart fruit handout was given. Portion sizes were discussed. Good blood sugar control is important to decrease the likelihood of diabetic complications such as nephropathy, neuropathy, limb loss, blindness, coronary artery disease, and death. Intensive lifestyle modification including diet, exercise and weight loss are the first line of treatment for diabetes.   2. Obesity with current BMI 46.7 Suzanne Carey is currently in the action stage of change. As such, her goal is to continue with weight loss efforts. She has agreed to the Category 3 Plan.   We will recheck fasting labs at her next visit.  Exercise goals: As is.  Behavioral modification strategies: no skipping meals.  Suzanne Carey has agreed to follow-up with our  clinic in 3 weeks. She was informed of the importance of frequent follow-up visits to maximize her success with intensive lifestyle modifications for her multiple health conditions.   Objective:   Blood pressure 112/66, pulse 75, temperature 98 F (36.7 C), height 5\' 7"  (1.702 m), weight 298 lb (135.2 kg), SpO2 96 %. Body mass index is 46.67 kg/m.  General: Cooperative, alert, well developed, in no acute distress. HEENT: Conjunctivae and lids unremarkable. Cardiovascular: Regular rhythm.  Lungs: Normal work of breathing. Neurologic: No focal deficits.   Lab Results  Component Value Date   CREATININE 0.65 07/22/2020   BUN 12 07/22/2020   NA 140 07/22/2020   K 3.9 07/22/2020   CL 103 07/22/2020   CO2 19 (L) 07/22/2020   Lab Results  Component Value Date   ALT 17 07/22/2020   AST 20 07/22/2020   ALKPHOS 78 07/22/2020   BILITOT 0.2 07/22/2020   Lab Results  Component Value Date   HGBA1C 8.3 (H) 07/22/2020   Lab Results  Component Value Date   INSULIN 26.4 (H) 07/22/2020   Lab Results  Component Value Date   TSH 1.120 07/22/2020   Lab Results  Component Value Date   CHOL 197 07/22/2020   HDL 41 07/22/2020   LDLCALC 127 (H) 07/22/2020   TRIG 165 (H) 07/22/2020   CHOLHDL 4.4 03/28/2019   Lab Results  Component Value Date   VD25OH 34.8 07/22/2020   Lab Results  Component Value Date   WBC 13.7 (H) 07/22/2020   HGB 11.7 07/22/2020   HCT 35.9 07/22/2020   MCV 92 07/22/2020  PLT 322 03/28/2019   No results found for: IRON, TIBC, FERRITIN  Attestation Statements:   Reviewed by clinician on day of visit: allergies, medications, problem list, medical history, surgical history, family history, social history, and previous encounter notes.  Time spent on visit including pre-visit chart review and post-visit care and charting was 30 minutes.    I, Burt Knack, am acting as transcriptionist for Quillian Quince, MD.  I have reviewed the above documentation for  accuracy and completeness, and I agree with the above. -  Quillian Quince, MD

## 2020-10-27 ENCOUNTER — Other Ambulatory Visit: Payer: Self-pay

## 2020-10-27 ENCOUNTER — Ambulatory Visit (INDEPENDENT_AMBULATORY_CARE_PROVIDER_SITE_OTHER): Payer: BC Managed Care – PPO | Admitting: Family Medicine

## 2020-10-27 ENCOUNTER — Encounter (INDEPENDENT_AMBULATORY_CARE_PROVIDER_SITE_OTHER): Payer: Self-pay | Admitting: Family Medicine

## 2020-10-27 VITALS — BP 125/58 | HR 72 | Temp 97.8°F | Ht 67.0 in | Wt 291.0 lb

## 2020-10-27 DIAGNOSIS — Z9189 Other specified personal risk factors, not elsewhere classified: Secondary | ICD-10-CM | POA: Diagnosis not present

## 2020-10-27 DIAGNOSIS — E785 Hyperlipidemia, unspecified: Secondary | ICD-10-CM | POA: Diagnosis not present

## 2020-10-27 DIAGNOSIS — E559 Vitamin D deficiency, unspecified: Secondary | ICD-10-CM

## 2020-10-27 DIAGNOSIS — E7849 Other hyperlipidemia: Secondary | ICD-10-CM | POA: Diagnosis not present

## 2020-10-27 DIAGNOSIS — E1169 Type 2 diabetes mellitus with other specified complication: Secondary | ICD-10-CM

## 2020-10-27 DIAGNOSIS — Z6841 Body Mass Index (BMI) 40.0 and over, adult: Secondary | ICD-10-CM

## 2020-10-27 NOTE — Progress Notes (Signed)
Chief Complaint:   OBESITY Suzanne Carey is here to discuss her progress with her obesity treatment plan along with follow-up of her obesity related diagnoses. Suzanne Carey is on the Category 3 Plan and states she is following her eating plan approximately 75% of the time. Suzanne Carey states she is walking for 10-15 minutes 5 times per week.  Today's visit was #: 5 Starting weight: 318 lbs Starting date: 07/22/2020 Today's weight: 291 lbs Today's date: 10/27/2020 Total lbs lost to date: 27 Total lbs lost since last in-office visit: 7  Interim History: Suzanne Carey continues to do well with weight loss. She sometimes struggles to eat all of her food. She is working on increasing walking and staying active.  Subjective:   1. Type 2 diabetes mellitus with other specified complication, without long-term current use of insulin (Suzanne Carey) Suzanne Carey was changed from Victoza to Suzanne Carey and is now at 0.5 mg weekly. She denies nausea, vomiting, or hypoglycemia. She is due for labs. She is doing excellent with her diet and weight loss.  2. Other hyperlipidemia Suzanne Carey is working on decreasing cholesterol in her diet. She denies chest pain.  3. Vitamin D deficiency Suzanne Carey is on OTC and prescription vit D, and she is due for labs.  4. At risk for heart disease Suzanne Carey is at a higher than average risk for cardiovascular disease due to obesity.   Assessment/Plan:   1. Type 2 diabetes mellitus with other specified complication, without long-term current use of insulin (HCC) We will check labs today. Suzanne Carey will continue diet and exercise. Good blood sugar control is important to decrease the likelihood of diabetic complications such as nephropathy, neuropathy, limb loss, blindness, coronary artery disease, and death. Intensive lifestyle modification including diet, exercise and weight loss are the first line of treatment for diabetes.   - CMP14+EGFR - Insulin, random - Hemoglobin A1c  2. Other hyperlipidemia Cardiovascular risk  and specific lipid/LDL goals reviewed. We discussed several lifestyle modifications today. We will check labs today. Suzanne Carey will continue to work on diet, exercise and weight loss efforts. Orders and follow up as documented in patient record.   - Lipid Panel With LDL/HDL Ratio  3. Vitamin D deficiency Low Vitamin D level contributes to fatigue and are associated with obesity, breast, and colon cancer. We will check labs today. Suzanne Carey will follow-up for routine testing of Vitamin D, at least 2-3 times per year to avoid over-replacement.  - VITAMIN D 25 Hydroxy (Vit-D Deficiency, Fractures)  4. At risk for heart disease Suzanne Carey was given approximately 15 minutes of coronary artery disease prevention counseling today. She is 60 y.o. female and has risk factors for heart disease including obesity. We discussed intensive lifestyle modifications today with an emphasis on specific weight loss instructions and strategies.   Repetitive spaced learning was employed today to elicit superior memory formation and behavioral change.  5. Obesity with current BMI 45.6 Suzanne Carey is currently in the action stage of change. As such, her goal is to continue with weight loss efforts. She has agreed to the Category 3 Plan.   Exercise goals: As is.  Behavioral modification strategies: no skipping meals.  Suzanne Carey has agreed to follow-up with our clinic in 4 weeks. She was informed of the importance of frequent follow-up visits to maximize her success with intensive lifestyle modifications for her multiple Carey conditions.   Suzanne Carey was informed we would discuss her lab results at her next visit unless there is a critical issue that needs to be addressed sooner.  Suzanne Carey agreed to keep her next visit at the agreed upon time to discuss these results.  Objective:   Blood pressure (!) 125/58, pulse 72, temperature 97.8 F (36.6 C), height 5' 7" (1.702 m), weight 291 lb (132 kg), SpO2 (!) 85 %. Body mass index is 45.58  kg/m.  General: Cooperative, alert, well developed, in no acute distress. HEENT: Conjunctivae and lids unremarkable. Cardiovascular: Regular rhythm.  Lungs: Normal work of breathing. Neurologic: No focal deficits.   Lab Results  Component Value Date   CREATININE 0.65 07/22/2020   BUN 12 07/22/2020   NA 140 07/22/2020   K 3.9 07/22/2020   CL 103 07/22/2020   CO2 19 (L) 07/22/2020   Lab Results  Component Value Date   ALT 17 07/22/2020   AST 20 07/22/2020   ALKPHOS 78 07/22/2020   BILITOT 0.2 07/22/2020   Lab Results  Component Value Date   HGBA1C 8.3 (H) 07/22/2020   Lab Results  Component Value Date   INSULIN 26.4 (H) 07/22/2020   Lab Results  Component Value Date   TSH 1.120 07/22/2020   Lab Results  Component Value Date   CHOL 197 07/22/2020   HDL 41 07/22/2020   LDLCALC 127 (H) 07/22/2020   TRIG 165 (H) 07/22/2020   CHOLHDL 4.4 03/28/2019   Lab Results  Component Value Date   VD25OH 34.8 07/22/2020   Lab Results  Component Value Date   WBC 13.7 (H) 07/22/2020   HGB 11.7 07/22/2020   HCT 35.9 07/22/2020   MCV 92 07/22/2020   PLT 322 03/28/2019   No results found for: IRON, TIBC, FERRITIN  Attestation Statements:   Reviewed by clinician on day of visit: allergies, medications, problem list, medical history, surgical history, family history, social history, and previous encounter notes.   I, Trixie Dredge, am acting as transcriptionist for Dennard Nip, MD.  I have reviewed the above documentation for accuracy and completeness, and I agree with the above. -  Dennard Nip, MD

## 2020-10-28 LAB — LIPID PANEL WITH LDL/HDL RATIO
Cholesterol, Total: 177 mg/dL (ref 100–199)
HDL: 26 mg/dL — ABNORMAL LOW (ref >39)
LDL Chol Calc (NIH): 110 mg/dL — ABNORMAL HIGH (ref 0–99)
LDL/HDL Ratio: 4.2 ratio — ABNORMAL HIGH (ref 0.0–3.2)
Triglycerides: 235 mg/dL — ABNORMAL HIGH (ref 0–149)
VLDL Cholesterol Cal: 41 mg/dL — ABNORMAL HIGH (ref 5–40)

## 2020-10-28 LAB — CMP14+EGFR
ALT: 14 IU/L (ref 0–32)
AST: 15 IU/L (ref 0–40)
Albumin/Globulin Ratio: 1.3 (ref 1.2–2.2)
Albumin: 3.9 g/dL (ref 3.8–4.8)
Alkaline Phosphatase: 65 IU/L (ref 44–121)
BUN/Creatinine Ratio: 18 (ref 12–28)
BUN: 28 mg/dL — ABNORMAL HIGH (ref 8–27)
Bilirubin Total: 0.3 mg/dL (ref 0.0–1.2)
CO2: 20 mmol/L (ref 20–29)
Calcium: 9.1 mg/dL (ref 8.7–10.3)
Chloride: 99 mmol/L (ref 96–106)
Creatinine, Ser: 1.6 mg/dL — ABNORMAL HIGH (ref 0.57–1.00)
Globulin, Total: 2.9 g/dL (ref 1.5–4.5)
Glucose: 185 mg/dL — ABNORMAL HIGH (ref 65–99)
Potassium: 3.5 mmol/L (ref 3.5–5.2)
Sodium: 137 mmol/L (ref 134–144)
Total Protein: 6.8 g/dL (ref 6.0–8.5)
eGFR: 36 mL/min/{1.73_m2} — ABNORMAL LOW (ref >59)

## 2020-10-28 LAB — INSULIN, RANDOM: INSULIN: 15.7 u[IU]/mL (ref 2.6–24.9)

## 2020-10-28 LAB — HEMOGLOBIN A1C
Est. average glucose Bld gHb Est-mCnc: 206 mg/dL
Hgb A1c MFr Bld: 8.8 % — ABNORMAL HIGH (ref 4.8–5.6)

## 2020-10-28 LAB — VITAMIN D 25 HYDROXY (VIT D DEFICIENCY, FRACTURES): Vit D, 25-Hydroxy: 84.7 ng/mL (ref 30.0–100.0)

## 2020-11-07 DIAGNOSIS — K219 Gastro-esophageal reflux disease without esophagitis: Secondary | ICD-10-CM | POA: Diagnosis not present

## 2020-11-07 DIAGNOSIS — E1165 Type 2 diabetes mellitus with hyperglycemia: Secondary | ICD-10-CM | POA: Diagnosis not present

## 2020-11-07 DIAGNOSIS — I10 Essential (primary) hypertension: Secondary | ICD-10-CM | POA: Diagnosis not present

## 2020-11-07 DIAGNOSIS — M150 Primary generalized (osteo)arthritis: Secondary | ICD-10-CM | POA: Diagnosis not present

## 2020-11-10 DIAGNOSIS — R600 Localized edema: Secondary | ICD-10-CM | POA: Diagnosis not present

## 2020-11-10 DIAGNOSIS — M79622 Pain in left upper arm: Secondary | ICD-10-CM | POA: Diagnosis not present

## 2020-11-14 DIAGNOSIS — M79622 Pain in left upper arm: Secondary | ICD-10-CM | POA: Diagnosis not present

## 2020-11-24 ENCOUNTER — Encounter (INDEPENDENT_AMBULATORY_CARE_PROVIDER_SITE_OTHER): Payer: Self-pay | Admitting: Family Medicine

## 2020-11-24 ENCOUNTER — Ambulatory Visit (INDEPENDENT_AMBULATORY_CARE_PROVIDER_SITE_OTHER): Payer: BC Managed Care – PPO | Admitting: Family Medicine

## 2020-11-24 ENCOUNTER — Other Ambulatory Visit: Payer: Self-pay

## 2020-11-24 VITALS — BP 98/58 | HR 81 | Temp 97.7°F | Ht 67.0 in | Wt 287.0 lb

## 2020-11-24 DIAGNOSIS — Z9189 Other specified personal risk factors, not elsewhere classified: Secondary | ICD-10-CM | POA: Diagnosis not present

## 2020-11-24 DIAGNOSIS — I10 Essential (primary) hypertension: Secondary | ICD-10-CM

## 2020-11-24 DIAGNOSIS — E1169 Type 2 diabetes mellitus with other specified complication: Secondary | ICD-10-CM | POA: Diagnosis not present

## 2020-11-24 DIAGNOSIS — E559 Vitamin D deficiency, unspecified: Secondary | ICD-10-CM | POA: Diagnosis not present

## 2020-11-24 DIAGNOSIS — Z6841 Body Mass Index (BMI) 40.0 and over, adult: Secondary | ICD-10-CM

## 2020-11-24 MED ORDER — ERGOCALCIFEROL 1.25 MG (50000 UT) PO CAPS: 50000.0000 [IU] | ORAL_CAPSULE | ORAL | 0 refills | Status: DC

## 2020-11-24 NOTE — Progress Notes (Signed)
Chief Complaint:   OBESITY Suzanne Carey is here to discuss her progress with her obesity treatment plan along with follow-up of her obesity related diagnoses. Suzanne Carey is on the Category 3 Plan and states she is following her eating plan approximately 80% of the time. Suzanne Carey states she is walking for 10-15 minutes 3 times per week.  Today's visit was #: 6 Starting weight: 318 lbs Starting date: 07/22/2020 Today's weight: 287 lbs Today's date: 11/24/2020 Total lbs lost to date: 31 Total lbs lost since last in-office visit: 4  Interim History: Suzanne Carey continues to do well with weight loss. She feels well and she has increased walking. Her hunger is controlled.  Subjective:   1. Essential hypertension Suzanne Carey's blood pressure is well controlled. She denies signs of feeling lightheaded, but her recent labs showed dehydration.  2. Type 2 diabetes mellitus with other specified complication, without long-term current use of insulin (HCC) Suzanne Carey is on Ozempic and recently increased to 1 mg. She denies nausea or vomiting, and she notes decreased polyphagia. Her recent A1c was elevated at 8.8.  3. Vitamin D deficiency Suzanne Carey's Vit D level is now at goal. She is at high risk of over-replacement.   4. At risk for dehydration Suzanne Carey is at risk for dehydration due to inadequate water intake.  Assessment/Plan:   1. Essential hypertension Suzanne Carey will continue her medications and increase her water intake to at least 80+ oz per day. She will watch for signs of hypotension as she continues her lifestyle modifications.  2. Type 2 diabetes mellitus with other specified complication, without long-term current use of insulin (HCC) Suzanne Carey will continue Ozempic at 1 mg, and may increase to 2 mg next month. Good blood sugar control is important to decrease the likelihood of diabetic complications such as nephropathy, neuropathy, limb loss, blindness, coronary artery disease, and death. Intensive lifestyle modification  including diet, exercise and weight loss are the first line of treatment for diabetes.   3. Vitamin D deficiency Low Vitamin D level contributes to fatigue and are associated with obesity, breast, and colon cancer. Suzanne Carey agreed to decrease prescription Vitamin D to 50,000 IU every week with no refills. She will follow-up for routine testing of Vitamin D, at least 2-3 times per year to avoid over-replacement.  - ergocalciferol (VITAMIN D2) 1.25 MG (50000 UT) capsule; Take 1 capsule (50,000 Units total) by mouth once a week.  Dispense: 1 capsule; Refill: 0  4. At risk for dehydration Suzanne Carey was given approximately 15 minutes dehydration prevention counseling today. Jacobi is at risk for dehydration due to weight loss and current medication(s). She was encouraged to hydrate and monitor fluid status to avoid dehydration as well as weight loss plateaus.   5. Obesity with current BMI 45.0 Suzanne Carey is currently in the action stage of change. As such, her goal is to continue with weight loss efforts. She has agreed to the Category 3 Plan.   Exercise goals: As is.  Behavioral modification strategies: increasing lean protein intake.  Suzanne Carey has agreed to follow-up with our clinic in 3 weeks. She was informed of the importance of frequent follow-up visits to maximize her success with intensive lifestyle modifications for her multiple health conditions.   Objective:   Blood pressure (!) 98/58, pulse 81, temperature 97.7 F (36.5 C), height 5\' 7"  (1.702 m), weight 287 lb (130.2 kg), SpO2 98 %. Body mass index is 44.95 kg/m.  General: Cooperative, alert, well developed, in no acute distress. HEENT: Conjunctivae and lids unremarkable.  Cardiovascular: Regular rhythm.  Lungs: Normal work of breathing. Neurologic: No focal deficits.   Lab Results  Component Value Date   CREATININE 1.60 (H) 10/27/2020   BUN 28 (H) 10/27/2020   NA 137 10/27/2020   K 3.5 10/27/2020   CL 99 10/27/2020   CO2 20 10/27/2020    Lab Results  Component Value Date   ALT 14 10/27/2020   AST 15 10/27/2020   ALKPHOS 65 10/27/2020   BILITOT 0.3 10/27/2020   Lab Results  Component Value Date   HGBA1C 8.8 (H) 10/27/2020   HGBA1C 8.3 (H) 07/22/2020   Lab Results  Component Value Date   INSULIN 15.7 10/27/2020   INSULIN 26.4 (H) 07/22/2020   Lab Results  Component Value Date   TSH 1.120 07/22/2020   Lab Results  Component Value Date   CHOL 177 10/27/2020   HDL 26 (L) 10/27/2020   LDLCALC 110 (H) 10/27/2020   TRIG 235 (H) 10/27/2020   CHOLHDL 4.4 03/28/2019   Lab Results  Component Value Date   VD25OH 84.7 10/27/2020   VD25OH 34.8 07/22/2020   Lab Results  Component Value Date   WBC 13.7 (H) 07/22/2020   HGB 11.7 07/22/2020   HCT 35.9 07/22/2020   MCV 92 07/22/2020   PLT 322 03/28/2019   No results found for: IRON, TIBC, FERRITIN  Attestation Statements:   Reviewed by clinician on day of visit: allergies, medications, problem list, medical history, surgical history, family history, social history, and previous encounter notes.   I, Burt Knack, am acting as transcriptionist for Quillian Quince, MD.  I have reviewed the above documentation for accuracy and completeness, and I agree with the above. -  Quillian Quince, MD

## 2020-12-18 ENCOUNTER — Encounter (INDEPENDENT_AMBULATORY_CARE_PROVIDER_SITE_OTHER): Payer: Self-pay | Admitting: Family Medicine

## 2020-12-18 ENCOUNTER — Other Ambulatory Visit: Payer: Self-pay

## 2020-12-18 ENCOUNTER — Ambulatory Visit (INDEPENDENT_AMBULATORY_CARE_PROVIDER_SITE_OTHER): Payer: BC Managed Care – PPO | Admitting: Family Medicine

## 2020-12-18 VITALS — BP 105/66 | HR 95 | Temp 97.7°F | Ht 67.0 in | Wt 281.0 lb

## 2020-12-18 DIAGNOSIS — E1169 Type 2 diabetes mellitus with other specified complication: Secondary | ICD-10-CM | POA: Diagnosis not present

## 2020-12-18 DIAGNOSIS — Z6841 Body Mass Index (BMI) 40.0 and over, adult: Secondary | ICD-10-CM | POA: Diagnosis not present

## 2020-12-18 NOTE — Progress Notes (Signed)
Chief Complaint:   OBESITY Suzanne Carey is here to discuss her progress with her obesity treatment plan along with follow-up of her obesity related diagnoses. Suzanne Carey is on the Category 3 Plan and states she is following her eating plan approximately 80% of the time. Suzanne Carey states she is walking for 10 minutes 3 times per week.  Today's visit was #: 7 Starting weight: 318 lbs Starting date: 07/22/2020 Today's weight: 281 lbs Today's date: 12/18/2020 Total lbs lost to date: 37 Total lbs lost since last in-office visit: 6  Interim History: Suzanne Carey continues to do well with weight loss. She sometimes struggles to est all of the food on her plan. She is staying active especially outdoors.  Subjective:   1. Type 2 diabetes mellitus with other specified complication, without long-term current use of insulin (HCC) Suzanne Carey recently increased Ozempic to 1 mg q weekly, and no side effects was noted. Last A1c was 8.8, but she is doing better with diet and weight loss.  Assessment/Plan:   1. Type 2 diabetes mellitus with other specified complication, without long-term current use of insulin (HCC) Suzanne Carey will continue Ozempic t 1 mg, and will consider increasing in the future if polyphagia returns. Good blood sugar control is important to decrease the likelihood of diabetic complications such as nephropathy, neuropathy, limb loss, blindness, coronary artery disease, and death. Intensive lifestyle modification including diet, exercise and weight loss are the first line of treatment for diabetes.   2. Obesity with current BMI 44.1 Suzanne Carey is currently in the action stage of change. As such, her goal is to continue with weight loss efforts. She has agreed to the Category 3 Plan.   Exercise goals: As is.  Behavioral modification strategies: increasing lean protein intake, decreasing simple carbohydrates, and holiday eating strategies .  Suzanne Carey has agreed to follow-up with our clinic in 3 to 4 weeks. She was  informed of the importance of frequent follow-up visits to maximize her success with intensive lifestyle modifications for her multiple health conditions.   Objective:   Blood pressure 105/66, pulse 95, temperature 97.7 F (36.5 C), height 5\' 7"  (1.702 m), weight 281 lb (127.5 kg), SpO2 98 %. Body mass index is 44.01 kg/m.  General: Cooperative, alert, well developed, in no acute distress. HEENT: Conjunctivae and lids unremarkable. Cardiovascular: Regular rhythm.  Lungs: Normal work of breathing. Neurologic: No focal deficits.   Lab Results  Component Value Date   CREATININE 1.60 (H) 10/27/2020   BUN 28 (H) 10/27/2020   NA 137 10/27/2020   K 3.5 10/27/2020   CL 99 10/27/2020   CO2 20 10/27/2020   Lab Results  Component Value Date   ALT 14 10/27/2020   AST 15 10/27/2020   ALKPHOS 65 10/27/2020   BILITOT 0.3 10/27/2020   Lab Results  Component Value Date   HGBA1C 8.8 (H) 10/27/2020   HGBA1C 8.3 (H) 07/22/2020   Lab Results  Component Value Date   INSULIN 15.7 10/27/2020   INSULIN 26.4 (H) 07/22/2020   Lab Results  Component Value Date   TSH 1.120 07/22/2020   Lab Results  Component Value Date   CHOL 177 10/27/2020   HDL 26 (L) 10/27/2020   LDLCALC 110 (H) 10/27/2020   TRIG 235 (H) 10/27/2020   CHOLHDL 4.4 03/28/2019   Lab Results  Component Value Date   VD25OH 84.7 10/27/2020   VD25OH 34.8 07/22/2020   Lab Results  Component Value Date   WBC 13.7 (H) 07/22/2020   HGB  11.7 07/22/2020   HCT 35.9 07/22/2020   MCV 92 07/22/2020   PLT 322 03/28/2019   No results found for: IRON, TIBC, FERRITIN  Attestation Statements:   Reviewed by clinician on day of visit: allergies, medications, problem list, medical history, surgical history, family history, social history, and previous encounter notes.  Time spent on visit including pre-visit chart review and post-visit care and charting was 23 minutes.    I, Trixie Dredge, am acting as transcriptionist for  Dennard Nip, MD.  I have reviewed the above documentation for accuracy and completeness, and I agree with the above. -  Dennard Nip, MD

## 2020-12-22 DIAGNOSIS — E1165 Type 2 diabetes mellitus with hyperglycemia: Secondary | ICD-10-CM | POA: Diagnosis not present

## 2020-12-22 DIAGNOSIS — K219 Gastro-esophageal reflux disease without esophagitis: Secondary | ICD-10-CM | POA: Diagnosis not present

## 2020-12-22 DIAGNOSIS — E782 Mixed hyperlipidemia: Secondary | ICD-10-CM | POA: Diagnosis not present

## 2020-12-22 DIAGNOSIS — I10 Essential (primary) hypertension: Secondary | ICD-10-CM | POA: Diagnosis not present

## 2021-01-20 ENCOUNTER — Ambulatory Visit (INDEPENDENT_AMBULATORY_CARE_PROVIDER_SITE_OTHER): Payer: BC Managed Care – PPO | Admitting: Family Medicine

## 2021-01-21 ENCOUNTER — Other Ambulatory Visit: Payer: Self-pay

## 2021-01-21 ENCOUNTER — Encounter (INDEPENDENT_AMBULATORY_CARE_PROVIDER_SITE_OTHER): Payer: Self-pay | Admitting: Family Medicine

## 2021-01-21 ENCOUNTER — Ambulatory Visit (INDEPENDENT_AMBULATORY_CARE_PROVIDER_SITE_OTHER): Payer: BC Managed Care – PPO | Admitting: Family Medicine

## 2021-01-21 VITALS — BP 124/64 | HR 85 | Temp 98.1°F | Ht 67.0 in | Wt 283.0 lb

## 2021-01-21 DIAGNOSIS — Z6841 Body Mass Index (BMI) 40.0 and over, adult: Secondary | ICD-10-CM

## 2021-01-21 DIAGNOSIS — I10 Essential (primary) hypertension: Secondary | ICD-10-CM | POA: Diagnosis not present

## 2021-01-21 DIAGNOSIS — E1169 Type 2 diabetes mellitus with other specified complication: Secondary | ICD-10-CM | POA: Diagnosis not present

## 2021-01-21 DIAGNOSIS — E66813 Obesity, class 3: Secondary | ICD-10-CM

## 2021-01-21 NOTE — Progress Notes (Signed)
Chief Complaint:   OBESITY Suzanne Carey is here to discuss her progress with her obesity treatment plan along with follow-up of her obesity related diagnoses. Suzanne Carey is on the Category 3 Plan and states she is following her eating plan approximately 75% of the time. Suzanne Carey states she is walking for 10-12 minutes 3 times per week.  Today's visit was #: 8 Starting weight: 318 lbs Starting date: 07/22/2020 Today's weight: 283 lbs Today's date: 01/21/2021 Total lbs lost to date: 35 Total lbs lost since last in-office visit: 0  Interim History: Suzanne Carey has done well with minimizing holiday weight gain over Thanksgiving. She has extra temptations for the rest of the year. She is walking at work and she is trying to take extra steps.  Subjective:   1. Essential hypertension Suzanne Carey's blood pressure is stable on her medications. She has her Lasix dose decreased recently and she may be up a bit in water weight.  2. Type 2 diabetes mellitus with other specified complication, without long-term current use of insulin (HCC) Suzanne Carey is now on 1 mg of Ozempic, and she is working on diet and exercise. No side effects were noted.  Assessment/Plan:   1. Essential hypertension Suzanne Carey will continue with diet, exercise, and her medications improve blood pressure control. She is to increase her water intake and we will recheck her blood pressure in 1 month.   2. Type 2 diabetes mellitus with other specified complication, without long-term current use of insulin Suzanne Carey will continue with diet, exercise, and weight loss, and she will continue Ozempic. We will recheck labs in 1 month. Good blood sugar control is important to decrease the likelihood of diabetic complications such as nephropathy, neuropathy, limb loss, blindness, coronary artery disease, and death. Intensive lifestyle modification including diet, exercise and weight loss are the first line of treatment for diabetes.   3. Obesity with current BMI  44.4 Suzanne Carey is currently in the action stage of change. As such, her goal is to continue with weight loss efforts. She has agreed to the Category 3 Plan.   Exercise goals: As is.  Behavioral modification strategies: increasing lean protein intake and meal planning and cooking strategies.  Suzanne Carey has agreed to follow-up with our clinic in 4 weeks. She was informed of the importance of frequent follow-up visits to maximize her success with intensive lifestyle modifications for her multiple health conditions.   Objective:   Blood pressure 124/64, pulse 85, temperature 98.1 F (36.7 C), height 5\' 7"  (1.702 m), weight 283 lb (128.4 kg), SpO2 100 %. Body mass index is 44.32 kg/m.  General: Cooperative, alert, well developed, in no acute distress. HEENT: Conjunctivae and lids unremarkable. Cardiovascular: Regular rhythm.  Lungs: Normal work of breathing. Neurologic: No focal deficits.   Lab Results  Component Value Date   CREATININE 1.60 (H) 10/27/2020   BUN 28 (H) 10/27/2020   NA 137 10/27/2020   K 3.5 10/27/2020   CL 99 10/27/2020   CO2 20 10/27/2020   Lab Results  Component Value Date   ALT 14 10/27/2020   AST 15 10/27/2020   ALKPHOS 65 10/27/2020   BILITOT 0.3 10/27/2020   Lab Results  Component Value Date   HGBA1C 8.8 (H) 10/27/2020   HGBA1C 8.3 (H) 07/22/2020   Lab Results  Component Value Date   INSULIN 15.7 10/27/2020   INSULIN 26.4 (H) 07/22/2020   Lab Results  Component Value Date   TSH 1.120 07/22/2020   Lab Results  Component Value  Date   CHOL 177 10/27/2020   HDL 26 (L) 10/27/2020   LDLCALC 110 (H) 10/27/2020   TRIG 235 (H) 10/27/2020   CHOLHDL 4.4 03/28/2019   Lab Results  Component Value Date   VD25OH 84.7 10/27/2020   VD25OH 34.8 07/22/2020   Lab Results  Component Value Date   WBC 13.7 (H) 07/22/2020   HGB 11.7 07/22/2020   HCT 35.9 07/22/2020   MCV 92 07/22/2020   PLT 322 03/28/2019   No results found for: IRON, TIBC,  FERRITIN  Attestation Statements:   Reviewed by clinician on day of visit: allergies, medications, problem list, medical history, surgical history, family history, social history, and previous encounter notes.  Time spent on visit including pre-visit chart review and post-visit care and charting was 30 minutes.    I, Trixie Dredge, am acting as transcriptionist for Dennard Nip, MD.  I have reviewed the above documentation for accuracy and completeness, and I agree with the above. -  Dennard Nip, MD

## 2021-01-27 ENCOUNTER — Other Ambulatory Visit: Payer: Self-pay | Admitting: Cardiology

## 2021-01-27 NOTE — Telephone Encounter (Signed)
Prescription refill request for Eliquis received. Indication: Afib  Last office visit:09/10/20 ([place])  Scr: 0.65 (07/22/20)  Age: 60 Weight: 128.4kg  Appropriate dose and refill sent to requested pharmacy.

## 2021-01-27 NOTE — Telephone Encounter (Signed)
Prescription refill request for Eliquis received.  Indication: afib  Last office visit:01/21/2021, [place] Scr: 1.60, 10/27/2020 Age: 60 yo  Weight:128.4 kg   Pt is on the correct dose of Eliquis. Refill sent.

## 2021-02-24 ENCOUNTER — Encounter (INDEPENDENT_AMBULATORY_CARE_PROVIDER_SITE_OTHER): Payer: Self-pay | Admitting: Family Medicine

## 2021-02-24 ENCOUNTER — Other Ambulatory Visit: Payer: Self-pay

## 2021-02-24 ENCOUNTER — Ambulatory Visit (INDEPENDENT_AMBULATORY_CARE_PROVIDER_SITE_OTHER): Payer: BC Managed Care – PPO | Admitting: Family Medicine

## 2021-02-24 VITALS — BP 121/81 | HR 84 | Temp 97.7°F | Ht 67.0 in | Wt 275.0 lb

## 2021-02-24 DIAGNOSIS — Z6841 Body Mass Index (BMI) 40.0 and over, adult: Secondary | ICD-10-CM

## 2021-02-24 DIAGNOSIS — E782 Mixed hyperlipidemia: Secondary | ICD-10-CM | POA: Diagnosis not present

## 2021-02-24 DIAGNOSIS — E559 Vitamin D deficiency, unspecified: Secondary | ICD-10-CM | POA: Diagnosis not present

## 2021-02-24 DIAGNOSIS — E039 Hypothyroidism, unspecified: Secondary | ICD-10-CM

## 2021-02-24 DIAGNOSIS — Z9189 Other specified personal risk factors, not elsewhere classified: Secondary | ICD-10-CM

## 2021-02-24 DIAGNOSIS — E1165 Type 2 diabetes mellitus with hyperglycemia: Secondary | ICD-10-CM | POA: Diagnosis not present

## 2021-02-24 DIAGNOSIS — Z7985 Long-term (current) use of injectable non-insulin antidiabetic drugs: Secondary | ICD-10-CM

## 2021-02-24 DIAGNOSIS — E1169 Type 2 diabetes mellitus with other specified complication: Secondary | ICD-10-CM | POA: Diagnosis not present

## 2021-02-24 DIAGNOSIS — E669 Obesity, unspecified: Secondary | ICD-10-CM

## 2021-02-24 NOTE — Progress Notes (Signed)
Chief Complaint:   OBESITY Suzanne Carey is here to discuss her progress with her obesity treatment plan along with follow-up of her obesity related diagnoses. Suzanne Carey is on the Category 3 Plan and states she is following her eating plan approximately 70-75% of the time. Suzanne Carey states she is walking for 10-15 minutes 3 times per week.  Today's visit was #: 9 Starting weight: 318 lbs Starting date: 07/22/2020 Today's weight: 275 lbs Today's date: 02/24/2021 Total lbs lost to date: 43 Total lbs lost since last in-office visit: 8  Interim History: Suzanne Carey continues to do well with weight loss. Her hunger is controlled and she is doing well with meal planning.  Subjective:   1. Vitamin D deficiency Suzanne Carey is on Vit D prescription and OTC. She is due for labs. No side effects were noted.  2. Mixed hyperlipidemia Suzanne Carey is working on diet and exercise. She has limited cholesterol in her diet.  3. Type 2 diabetes mellitus with other specified complication, without long-term current use of insulin (HCC) Suzanne Carey continues to do well with diet and exercise. She is on Ozempic and she notes constipation.  4. Hypothyroidism, unspecified type Suzanne Carey is on Synthroid, and she is due for labs. No side effects were noted.  5. At risk for heart disease Suzanne Carey is at a higher than average risk for cardiovascular disease due to obesity.   Assessment/Plan:   1. Vitamin D deficiency Low Vitamin D level contributes to fatigue and are associated with obesity, breast, and colon cancer. We will check labs today. Suzanne Carey will continue prescription Vitamin D and OTC Vit D. She will follow-up for routine testing of Vitamin D, at least 2-3 times per year to avoid over-replacement.  - VITAMIN D 25 Hydroxy (Vit-D Deficiency, Fractures)  2. Mixed hyperlipidemia Cardiovascular risk and specific lipid/LDL goals reviewed. We discussed several lifestyle modifications today. We will check labs today. Suzanne Carey will continue to work on  diet, exercise and weight loss efforts. Orders and follow up as documented in patient record.   - Lipid Panel With LDL/HDL Ratio  3. Type 2 diabetes mellitus with other specified complication, without long-term current use of insulin (HCC) We will check labs today. I discussed high fiber foods with the patient. Good blood sugar control is important to decrease the likelihood of diabetic complications such as nephropathy, neuropathy, limb loss, blindness, coronary artery disease, and death. Intensive lifestyle modification including diet, exercise and weight loss are the first line of treatment for diabetes.   - CMP14+EGFR - Insulin, random - Hemoglobin A1c  4. Hypothyroidism, unspecified type We will check labs today, and we will follow up at [facility] next visit. Orders and follow up as documented in patient record.  - TSH  5. At risk for heart disease Suzanne Carey was given approximately 15 minutes of coronary artery disease prevention counseling today. She is 61 y.o. female and has risk factors for heart disease including obesity. We discussed intensive lifestyle modifications today with an emphasis on specific weight loss instructions and strategies.   Repetitive spaced learning was employed today to elicit superior memory formation and behavioral change.  6. Obesity with current BMI 43.2 Suzanne Carey is currently in the action stage of change. As such, her goal is to continue with weight loss efforts. She has agreed to the Category 3 Plan.   Exercise goals: As is.  Behavioral modification strategies: increasing lean protein intake.  Suzanne Carey has agreed to follow-up with our clinic in 4 weeks. She was informed of the  importance of frequent follow-up visits to maximize her success with intensive lifestyle modifications for her multiple health conditions.   Suzanne Carey was informed we would discuss her lab results at her next visit unless there is a critical issue that needs to be addressed sooner. Suzanne Carey  agreed to keep her next visit at the agreed upon time to discuss these results.  Objective:   Blood pressure 121/81, pulse 84, temperature 97.7 F (36.5 C), height 5' 7"  (1.702 m), weight 275 lb (124.7 kg), SpO2 99 %. Body mass index is 43.07 kg/m.  General: Cooperative, alert, well developed, in no acute distress. HEENT: Conjunctivae and lids unremarkable. Cardiovascular: Regular rhythm.  Lungs: Normal work of breathing. Neurologic: No focal deficits.   Lab Results  Component Value Date   CREATININE 1.60 (H) 10/27/2020   BUN 28 (H) 10/27/2020   NA 137 10/27/2020   K 3.5 10/27/2020   CL 99 10/27/2020   CO2 20 10/27/2020   Lab Results  Component Value Date   ALT 14 10/27/2020   AST 15 10/27/2020   ALKPHOS 65 10/27/2020   BILITOT 0.3 10/27/2020   Lab Results  Component Value Date   HGBA1C 8.8 (H) 10/27/2020   HGBA1C 8.3 (H) 07/22/2020   Lab Results  Component Value Date   INSULIN 15.7 10/27/2020   INSULIN 26.4 (H) 07/22/2020   Lab Results  Component Value Date   TSH 1.120 07/22/2020   Lab Results  Component Value Date   CHOL 177 10/27/2020   HDL 26 (L) 10/27/2020   LDLCALC 110 (H) 10/27/2020   TRIG 235 (H) 10/27/2020   CHOLHDL 4.4 03/28/2019   Lab Results  Component Value Date   VD25OH 84.7 10/27/2020   VD25OH 34.8 07/22/2020   Lab Results  Component Value Date   WBC 13.7 (H) 07/22/2020   HGB 11.7 07/22/2020   HCT 35.9 07/22/2020   MCV 92 07/22/2020   PLT 322 03/28/2019   No results found for: IRON, TIBC, FERRITIN  Attestation Statements:   Reviewed by clinician on day of visit: allergies, medications, problem list, medical history, surgical history, family history, social history, and previous encounter notes.   I, Trixie Dredge, am acting as transcriptionist for Dennard Nip, MD.  I have reviewed the above documentation for accuracy and completeness, and I agree with the above. -  Dennard Nip, MD

## 2021-02-25 LAB — CMP14+EGFR
ALT: 9 IU/L (ref 0–32)
AST: 6 IU/L (ref 0–40)
Albumin/Globulin Ratio: 1.5 (ref 1.2–2.2)
Albumin: 4.3 g/dL (ref 3.8–4.8)
Alkaline Phosphatase: 134 IU/L — ABNORMAL HIGH (ref 44–121)
BUN/Creatinine Ratio: 21 (ref 12–28)
BUN: 15 mg/dL (ref 8–27)
Bilirubin Total: <0.2 mg/dL (ref 0.0–1.2)
CO2: 22 mmol/L (ref 20–29)
Calcium: 9.3 mg/dL (ref 8.7–10.3)
Chloride: 100 mmol/L (ref 96–106)
Creatinine, Ser: 0.71 mg/dL (ref 0.57–1.00)
Globulin, Total: 2.8 g/dL (ref 1.5–4.5)
Glucose: 95 mg/dL (ref 70–99)
Potassium: 4.3 mmol/L (ref 3.5–5.2)
Sodium: 142 mmol/L (ref 134–144)
Total Protein: 7.1 g/dL (ref 6.0–8.5)
eGFR: 96 mL/min/{1.73_m2} (ref >59)

## 2021-02-25 LAB — TSH: TSH: 1.46 u[IU]/mL (ref 0.450–4.500)

## 2021-02-25 LAB — LIPID PANEL WITH LDL/HDL RATIO
Cholesterol, Total: 164 mg/dL (ref 100–199)
HDL: 55 mg/dL (ref >39)
LDL Chol Calc (NIH): 90 mg/dL (ref 0–99)
LDL/HDL Ratio: 1.6 ratio (ref 0.0–3.2)
Triglycerides: 107 mg/dL (ref 0–149)
VLDL Cholesterol Cal: 19 mg/dL (ref 5–40)

## 2021-02-25 LAB — INSULIN, RANDOM: INSULIN: 11.1 u[IU]/mL (ref 2.6–24.9)

## 2021-02-25 LAB — HEMOGLOBIN A1C
Est. average glucose Bld gHb Est-mCnc: 157 mg/dL
Hgb A1c MFr Bld: 7.1 % — ABNORMAL HIGH (ref 4.8–5.6)

## 2021-02-25 LAB — VITAMIN D 25 HYDROXY (VIT D DEFICIENCY, FRACTURES): Vit D, 25-Hydroxy: 38.2 ng/mL (ref 30.0–100.0)

## 2021-02-27 ENCOUNTER — Other Ambulatory Visit: Payer: Self-pay | Admitting: Obstetrics and Gynecology

## 2021-02-27 DIAGNOSIS — Z1231 Encounter for screening mammogram for malignant neoplasm of breast: Secondary | ICD-10-CM

## 2021-03-19 DIAGNOSIS — Z124 Encounter for screening for malignant neoplasm of cervix: Secondary | ICD-10-CM | POA: Diagnosis not present

## 2021-03-19 DIAGNOSIS — Z6841 Body Mass Index (BMI) 40.0 and over, adult: Secondary | ICD-10-CM | POA: Diagnosis not present

## 2021-03-19 DIAGNOSIS — Z01419 Encounter for gynecological examination (general) (routine) without abnormal findings: Secondary | ICD-10-CM | POA: Diagnosis not present

## 2021-03-23 ENCOUNTER — Ambulatory Visit (INDEPENDENT_AMBULATORY_CARE_PROVIDER_SITE_OTHER): Payer: BC Managed Care – PPO | Admitting: Family Medicine

## 2021-03-23 ENCOUNTER — Other Ambulatory Visit: Payer: Self-pay

## 2021-03-23 ENCOUNTER — Encounter (INDEPENDENT_AMBULATORY_CARE_PROVIDER_SITE_OTHER): Payer: Self-pay | Admitting: Family Medicine

## 2021-03-23 VITALS — BP 110/68 | HR 78 | Temp 97.8°F | Ht 67.0 in | Wt 276.0 lb

## 2021-03-23 DIAGNOSIS — E669 Obesity, unspecified: Secondary | ICD-10-CM

## 2021-03-23 DIAGNOSIS — E1169 Type 2 diabetes mellitus with other specified complication: Secondary | ICD-10-CM

## 2021-03-23 DIAGNOSIS — K5909 Other constipation: Secondary | ICD-10-CM | POA: Diagnosis not present

## 2021-03-23 DIAGNOSIS — Z9189 Other specified personal risk factors, not elsewhere classified: Secondary | ICD-10-CM

## 2021-03-23 DIAGNOSIS — E559 Vitamin D deficiency, unspecified: Secondary | ICD-10-CM | POA: Diagnosis not present

## 2021-03-23 DIAGNOSIS — Z6841 Body Mass Index (BMI) 40.0 and over, adult: Secondary | ICD-10-CM

## 2021-03-23 MED ORDER — DOCUSATE SODIUM 100 MG PO CAPS: 100.0000 mg | ORAL_CAPSULE | Freq: Two times a day (BID) | ORAL | 0 refills | Status: DC

## 2021-03-23 NOTE — Progress Notes (Signed)
Chief Complaint:   OBESITY Suzanne Carey is here to discuss her progress with her obesity treatment plan along with follow-up of her obesity related diagnoses. Suzanne Carey is on the Category 3 Plan and states she is following her eating plan approximately 75% of the time. Suzanne Carey states she is walking more 5 times per week.   Today's visit was #: 10 Starting weight: 318 lbs Starting date: 07/22/2020 Today's weight: 276 lbs Today's date: 03/23/2021 Total lbs lost to date: 42 Total lbs lost since last in-office visit: 0  Interim History: Suzanne Carey continues to do well with weight loss. She is retaining some fluid today. She continues to work on her diet and weight loss, and she is trying to increase her protein.  Subjective:   1. Other constipation Suzanne Carey is unable to take miralax, and she notes Linzess works "too well" with watery diarrhea. This seems to have worsened with weight loss.   2. Vitamin D deficiency Suzanne Carey is on Vitamin D, and she had been close to over-replaced when taking Vitamin D prescription twice weekly, but her level has now dropped too low on once weekly.   3. Type 2 diabetes mellitus with other specified complication, without long-term current use of insulin (HCC) Suzanne Carey's A1c has improved from 8.8 to 7.1 with diet. She is not on medications and she is working on weight loss. No hypoglycemia was noted.  4. At risk for diarrhea Suzanne Carey is at risk for diarrhea due to Colace and weight loss.  Assessment/Plan:   1. Other constipation Suzanne Carey agreed to start Colace 100 mg BID with no refills, and she is to increase her water intake. She was informed that a decrease in bowel movement frequency is normal while losing weight, but stools should not be hard or painful. Orders and follow up as documented in patient record.   - docusate sodium (COLACE) 100 MG capsule; Take 1 capsule (100 mg total) by mouth 2 (two) times daily.  Dispense: 60 capsule; Refill: 0  2. Vitamin D deficiency Low  Vitamin D level contributes to fatigue and are associated with obesity, breast, and colon cancer. Suzanne Carey will continue prescription Vitamin D 50,000 IU every week and increase OTC Vit D to 2,000 IU BID. She will follow-up for routine testing of Vitamin D, at least 2-3 times per year to avoid over-replacement.  3. Type 2 diabetes mellitus with other specified complication, without long-term current use of insulin [facility]) Suzanne Carey will continue with her diet and exercise, and her A1c goal of 6.5. We will continue to follow. Good blood sugar control is important to decrease the likelihood of diabetic complications such as nephropathy, neuropathy, limb loss, blindness, coronary artery disease, and death. Intensive lifestyle modification including diet, exercise and weight loss are the first line of treatment for diabetes.   4. At risk for diarrhea Suzanne Carey was given approximately 15 minutes of diarrhea prevention counseling today. She is 61 y.o. female and has risk factors for diarrhea including medications and changes in diet. We discussed intensive lifestyle modifications today with an emphasis on specific weight loss instructions including dietary strategies.   Repetitive spaced learning was employed today to elicit superior memory formation and behavioral change.  5. Obesity with current BMI 43.3 Suzanne Carey is currently in the action stage of change. As such, her goal is to continue with weight loss efforts. She has agreed to the Category 3 Plan.   Exercise goals: As is.  Behavioral modification strategies: increasing vegetables and increasing water intake.  Suzanne Carey  has agreed to follow-up with our clinic in 3 to 4 weeks. She was informed of the importance of frequent follow-up visits to maximize her success with intensive lifestyle modifications for her multiple health conditions.   Objective:   Blood pressure 110/68, pulse 78, temperature 97.8 F (36.6 C), height 5\' 7"  (1.702 m), weight 276 lb (125.2 kg),  SpO2 99 %. Body mass index is 43.23 kg/m.  General: Cooperative, alert, well developed, in no acute distress. HEENT: Conjunctivae and lids unremarkable. Cardiovascular: Regular rhythm.  Lungs: Normal work of breathing. Neurologic: No focal deficits.   Lab Results  Component Value Date   CREATININE 0.71 02/24/2021   BUN 15 02/24/2021   NA 142 02/24/2021   K 4.3 02/24/2021   CL 100 02/24/2021   CO2 22 02/24/2021   Lab Results  Component Value Date   ALT 9 02/24/2021   AST 6 02/24/2021   ALKPHOS 134 (H) 02/24/2021   BILITOT <0.2 02/24/2021   Lab Results  Component Value Date   HGBA1C 7.1 (H) 02/24/2021   HGBA1C 8.8 (H) 10/27/2020   HGBA1C 8.3 (H) 07/22/2020   Lab Results  Component Value Date   INSULIN 11.1 02/24/2021   INSULIN 15.7 10/27/2020   INSULIN 26.4 (H) 07/22/2020   Lab Results  Component Value Date   TSH 1.460 02/24/2021   Lab Results  Component Value Date   CHOL 164 02/24/2021   HDL 55 02/24/2021   LDLCALC 90 02/24/2021   TRIG 107 02/24/2021   CHOLHDL 4.4 03/28/2019   Lab Results  Component Value Date   VD25OH 38.2 02/24/2021   VD25OH 84.7 10/27/2020   VD25OH 34.8 07/22/2020   Lab Results  Component Value Date   WBC 13.7 (H) 07/22/2020   HGB 11.7 07/22/2020   HCT 35.9 07/22/2020   MCV 92 07/22/2020   PLT 322 03/28/2019   No results found for: IRON, TIBC, FERRITIN  Attestation Statements:   Reviewed by clinician on day of visit: allergies, medications, problem list, medical history, surgical history, family history, social history, and previous encounter notes.   I, Trixie Dredge, am acting as transcriptionist for Dennard Nip, MD.  I have reviewed the above documentation for accuracy and completeness, and I agree with the above. -  Dennard Nip, MD

## 2021-03-27 DIAGNOSIS — E1165 Type 2 diabetes mellitus with hyperglycemia: Secondary | ICD-10-CM | POA: Diagnosis not present

## 2021-03-27 DIAGNOSIS — M150 Primary generalized (osteo)arthritis: Secondary | ICD-10-CM | POA: Diagnosis not present

## 2021-03-27 DIAGNOSIS — E782 Mixed hyperlipidemia: Secondary | ICD-10-CM | POA: Diagnosis not present

## 2021-03-27 DIAGNOSIS — I10 Essential (primary) hypertension: Secondary | ICD-10-CM | POA: Diagnosis not present

## 2021-03-27 DIAGNOSIS — K219 Gastro-esophageal reflux disease without esophagitis: Secondary | ICD-10-CM | POA: Diagnosis not present

## 2021-03-30 DIAGNOSIS — I6523 Occlusion and stenosis of bilateral carotid arteries: Secondary | ICD-10-CM | POA: Diagnosis not present

## 2021-03-30 DIAGNOSIS — R0989 Other specified symptoms and signs involving the circulatory and respiratory systems: Secondary | ICD-10-CM | POA: Diagnosis not present

## 2021-04-03 DIAGNOSIS — R221 Localized swelling, mass and lump, neck: Secondary | ICD-10-CM | POA: Diagnosis not present

## 2021-04-03 DIAGNOSIS — E042 Nontoxic multinodular goiter: Secondary | ICD-10-CM | POA: Diagnosis not present

## 2021-04-07 DIAGNOSIS — R2241 Localized swelling, mass and lump, right lower limb: Secondary | ICD-10-CM | POA: Diagnosis not present

## 2021-04-10 ENCOUNTER — Ambulatory Visit
Admission: RE | Admit: 2021-04-10 | Discharge: 2021-04-10 | Disposition: A | Discharge status: Home / Self Care | Payer: BC Managed Care – PPO | Source: Ambulatory Visit | Attending: Obstetrics and Gynecology | Admitting: Obstetrics and Gynecology

## 2021-04-10 DIAGNOSIS — Z1231 Encounter for screening mammogram for malignant neoplasm of breast: Secondary | ICD-10-CM

## 2021-04-16 DIAGNOSIS — I77811 Abdominal aortic ectasia: Secondary | ICD-10-CM | POA: Diagnosis not present

## 2021-04-21 ENCOUNTER — Encounter (INDEPENDENT_AMBULATORY_CARE_PROVIDER_SITE_OTHER): Payer: Self-pay | Admitting: Family Medicine

## 2021-04-21 ENCOUNTER — Ambulatory Visit (INDEPENDENT_AMBULATORY_CARE_PROVIDER_SITE_OTHER): Payer: BC Managed Care – PPO | Admitting: Family Medicine

## 2021-04-21 ENCOUNTER — Other Ambulatory Visit: Payer: Self-pay

## 2021-04-21 VITALS — BP 121/67 | HR 73 | Temp 97.7°F | Ht 67.0 in | Wt 271.0 lb

## 2021-04-21 DIAGNOSIS — E1169 Type 2 diabetes mellitus with other specified complication: Secondary | ICD-10-CM | POA: Diagnosis not present

## 2021-04-21 DIAGNOSIS — E669 Obesity, unspecified: Secondary | ICD-10-CM | POA: Diagnosis not present

## 2021-04-21 DIAGNOSIS — Z9189 Other specified personal risk factors, not elsewhere classified: Secondary | ICD-10-CM

## 2021-04-21 DIAGNOSIS — Z7985 Long-term (current) use of injectable non-insulin antidiabetic drugs: Secondary | ICD-10-CM

## 2021-04-21 DIAGNOSIS — Z6841 Body Mass Index (BMI) 40.0 and over, adult: Secondary | ICD-10-CM | POA: Diagnosis not present

## 2021-04-21 DIAGNOSIS — K5909 Other constipation: Secondary | ICD-10-CM | POA: Diagnosis not present

## 2021-04-21 DIAGNOSIS — R590 Localized enlarged lymph nodes: Secondary | ICD-10-CM | POA: Diagnosis not present

## 2021-04-21 MED ORDER — DOCUSATE SODIUM 100 MG PO CAPS: 100.0000 mg | ORAL_CAPSULE | Freq: Two times a day (BID) | ORAL | 0 refills | Status: AC

## 2021-04-27 NOTE — Progress Notes (Signed)
? ? ? ?Chief Complaint:  ? ?OBESITY ?Suzanne Carey is here to discuss her progress with her obesity treatment plan along with follow-up of her obesity related diagnoses. Suzanne Carey is on the Category 3 Plan and states she is following her eating plan approximately 75% of the time. Suzanne Carey states she is walking for 30 minutes 3 times per week. ? ?Today's visit was #: [id number] ?Starting weight: 318 lbs ?Starting date: 07/22/2020 ?Today's weight: 271 lbs ?Today's date: 04/21/2021 ?Total lbs lost to date: 61 ?Total lbs lost since last in-office visit: 5 ? ?Interim History: Suzanne Carey continues to do very well with weight loss. She is doing well with meal planning and she has increased her activity. She is walking much more often, and she notes decreased muscle cramping. She will be doing some traveling soon. ? ?Subjective:  ? ?1. Type 2 diabetes mellitus with other specified complication, without long-term current use of insulin ([place]) ?Suzanne Carey continues to do very well with diet and exercise. She is stable on her medications although her PCP would like to increase Ozempic to 2 mg once the supply of this dose improves.  ? ?2. Other constipation ?Suzanne Carey is doing well on Colace, and she is working on increasing her water and vegetable intake. ? ?3. At risk for impaired metabolic function ?Suzanne Carey is at increased risk for impaired metabolic function if protein decreases. ? ?Assessment/Plan:  ? ?1. Type 2 diabetes mellitus with other specified complication, without long-term current use of insulin ([place]) ?Suzanne Carey will continue Ozempic and Actos, and we will continue to monitor. Good blood sugar control is important to decrease the likelihood of diabetic complications such as nephropathy, neuropathy, limb loss, blindness, coronary artery disease, and death. Intensive lifestyle modification including diet, exercise and weight loss are the first line of treatment for diabetes.  ? ?2. Other constipation ?Suzanne Carey will continue Colace 100 mg BID, and we will refill  for 1 month. She will continue to increase her water intake. ? ?- docusate sodium (COLACE) 100 MG capsule; Take 1 capsule (100 mg total) by mouth 2 (two) times daily.  Dispense: 60 capsule; Refill: 0 ? ?3. At risk for impaired metabolic function ?Suzanne Carey was given approximately 15 minutes of impaired  metabolic function prevention counseling today. We discussed intensive lifestyle modifications today with an emphasis on specific nutrition and exercise instructions and strategies.  ? ?Repetitive spaced learning was employed today to elicit superior memory formation and behavioral change. ? ?4. Obesity with current BMI 42.5 ?Suzanne Carey is currently in the action stage of change. As such, her goal is to continue with weight loss efforts. She has agreed to the Category 3 Plan.  ? ?Exercise goals: As is. ? ?Behavioral modification strategies: increasing water intake and travel eating strategies. ? ?Suzanne Carey has agreed to follow-up with our clinic in 3 weeks. She was informed of the importance of frequent follow-up visits to maximize her success with intensive lifestyle modifications for her multiple health conditions.  ? ?Objective:  ? ?Blood pressure 121/67, pulse 73, temperature 97.7 ?F (36.5 ?C), height 5\' 7"  (1.702 m), weight 271 lb (122.9 kg), SpO2 97 %. ?Body mass index is 42.44 kg/m?. ? ?General: Cooperative, alert, well developed, in no acute distress. ?HEENT: Conjunctivae and lids unremarkable. ?Cardiovascular: Regular rhythm.  ?Lungs: Normal work of breathing. ?Neurologic: No focal deficits.  ? ?Lab Results  ?Component Value Date  ? CREATININE 0.71 02/24/2021  ? BUN 15 02/24/2021  ? NA 142 02/24/2021  ? K 4.3 02/24/2021  ? CL 100 02/24/2021  ?  CO2 22 02/24/2021  ? ?Lab Results  ?Component Value Date  ? ALT 9 02/24/2021  ? AST 6 02/24/2021  ? ALKPHOS 134 (H) 02/24/2021  ? BILITOT <0.2 02/24/2021  ? ?Lab Results  ?Component Value Date  ? HGBA1C 7.1 (H) 02/24/2021  ? HGBA1C 8.8 (H) 10/27/2020  ? HGBA1C 8.3 (H) 07/22/2020   ? ?Lab Results  ?Component Value Date  ? INSULIN 11.1 02/24/2021  ? INSULIN 15.7 10/27/2020  ? INSULIN 26.4 (H) 07/22/2020  ? ?Lab Results  ?Component Value Date  ? TSH 1.460 02/24/2021  ? ?Lab Results  ?Component Value Date  ? CHOL 164 02/24/2021  ? HDL 55 02/24/2021  ? [place] 90 02/24/2021  ? TRIG 107 02/24/2021  ? CHOLHDL 4.4 03/28/2019  ? ?Lab Results  ?Component Value Date  ? VD25OH 38.2 02/24/2021  ? VD25OH 84.7 10/27/2020  ? VD25OH 34.8 07/22/2020  ? ?Lab Results  ?Component Value Date  ? WBC 13.7 (H) 07/22/2020  ? HGB 11.7 07/22/2020  ? HCT 35.9 07/22/2020  ? MCV 92 07/22/2020  ? PLT 322 03/28/2019  ? ?No results found for: IRON, TIBC, FERRITIN ? ?Attestation Statements:  ? ?Reviewed by clinician on day of visit: allergies, medications, problem list, medical history, surgical history, family history, social history, and previous encounter notes. ? ? ?I, Trixie Dredge, am acting as transcriptionist for Dennard Nip, MD. ? ?I have reviewed the above documentation for accuracy and completeness, and I agree with the above. -  Dennard Nip, MD ? ? ?

## 2021-05-05 ENCOUNTER — Other Ambulatory Visit: Payer: Self-pay | Admitting: Cardiology

## 2021-05-21 ENCOUNTER — Ambulatory Visit (INDEPENDENT_AMBULATORY_CARE_PROVIDER_SITE_OTHER): Payer: BC Managed Care – PPO | Admitting: Family Medicine

## 2021-05-21 ENCOUNTER — Encounter (INDEPENDENT_AMBULATORY_CARE_PROVIDER_SITE_OTHER): Payer: Self-pay | Admitting: Family Medicine

## 2021-05-21 VITALS — BP 107/72 | HR 65 | Ht 67.0 in | Wt 274.0 lb

## 2021-05-21 DIAGNOSIS — Z6841 Body Mass Index (BMI) 40.0 and over, adult: Secondary | ICD-10-CM | POA: Diagnosis not present

## 2021-05-21 DIAGNOSIS — E669 Obesity, unspecified: Secondary | ICD-10-CM | POA: Diagnosis not present

## 2021-05-21 DIAGNOSIS — I10 Essential (primary) hypertension: Secondary | ICD-10-CM

## 2021-05-21 DIAGNOSIS — E1169 Type 2 diabetes mellitus with other specified complication: Secondary | ICD-10-CM

## 2021-05-21 DIAGNOSIS — Z7984 Long term (current) use of oral hypoglycemic drugs: Secondary | ICD-10-CM

## 2021-05-22 ENCOUNTER — Ambulatory Visit: Payer: BC Managed Care – PPO | Admitting: Cardiology

## 2021-05-24 DIAGNOSIS — E1165 Type 2 diabetes mellitus with hyperglycemia: Secondary | ICD-10-CM | POA: Diagnosis not present

## 2021-06-02 NOTE — Progress Notes (Signed)
? ? ? ?Chief Complaint:  ? ?OBESITY ?Suzanne Carey is here to discuss her progress with her obesity treatment plan along with follow-up of her obesity related diagnoses. Suzanne Carey is on the Category 3 Plan and states she is following her eating plan approximately 75% of the time. Suzanne Carey states she is walking for 20-30 minutes 3 times per week. ? ?Today's visit was #: 12 ?Starting weight: 318 lbs ?Starting date: 07/22/2020 ?Today's weight: 274 lbs ?Today's date: 05/21/2021 ?Total lbs lost to date: 33 ?Total lbs lost since last in-office visit: 0 ? ?Interim History: Suzanne Carey is retaining a bit of fluid today. She is working on diet and Psychologist, counselling. She will be traveling soon to the beach but she feels she will be able to do well with her eating while there.  ? ?Subjective:  ? ?1. Essential hypertension ?Suzanne Carey's blood pressure is controlled on her medications and diet. She denies signs of hypotension.  ? ?2. Type 2 diabetes mellitus with other specified complication, without long-term current use of insulin ([place]) ?Suzanne Carey is stable on Actos. She is doing well with diet and weight loss.  ? ?Assessment/Plan:  ? ?1. Essential hypertension ?Suzanne Carey will continue her diet, exercise, and medications and will continue to follow. We will recheck labs next months. ? ?2. Type 2 diabetes mellitus with other specified complication, without long-term current use of insulin ([place]) ?Suzanne Carey will continue her diet and exercise. We will recheck labs next month.  ? ?3. Obesity with current BMI 42.9 ?Suzanne Carey is currently in the action stage of change. As such, her goal is to continue with weight loss efforts. She has agreed to the Category 3 Plan.  ? ?We will recheck fasting labs at her next visit. ? ?Exercise goals: As is. ? ?Behavioral modification strategies: increasing water intake and travel eating strategies. ? ?Suzanne Carey has agreed to follow-up with our clinic in 4 weeks. She was informed of the importance of frequent follow-up visits to maximize  her success with intensive lifestyle modifications for her multiple health conditions.  ? ?Objective:  ? ?Blood pressure 107/72, pulse 65, height 5\' 7"  (1.702 m), weight 274 lb (124.3 kg), SpO2 99 %. ?Body mass index is 42.91 kg/m?. ? ?General: Cooperative, alert, well developed, in no acute distress. ?HEENT: Conjunctivae and lids unremarkable. ?Cardiovascular: Regular rhythm.  ?Lungs: Normal work of breathing. ?Neurologic: No focal deficits.  ? ?Lab Results  ?Component Value Date  ? CREATININE 0.71 02/24/2021  ? BUN 15 02/24/2021  ? NA 142 02/24/2021  ? K 4.3 02/24/2021  ? CL 100 02/24/2021  ? CO2 22 02/24/2021  ? ?Lab Results  ?Component Value Date  ? ALT 9 02/24/2021  ? AST 6 02/24/2021  ? ALKPHOS 134 (H) 02/24/2021  ? BILITOT <0.2 02/24/2021  ? ?Lab Results  ?Component Value Date  ? HGBA1C 7.1 (H) 02/24/2021  ? HGBA1C 8.8 (H) 10/27/2020  ? HGBA1C 8.3 (H) 07/22/2020  ? ?Lab Results  ?Component Value Date  ? INSULIN 11.1 02/24/2021  ? INSULIN 15.7 10/27/2020  ? INSULIN 26.4 (H) 07/22/2020  ? ?Lab Results  ?Component Value Date  ? TSH 1.460 02/24/2021  ? ?Lab Results  ?Component Value Date  ? CHOL 164 02/24/2021  ? HDL 55 02/24/2021  ? [place] 90 02/24/2021  ? TRIG 107 02/24/2021  ? CHOLHDL 4.4 03/28/2019  ? ?Lab Results  ?Component Value Date  ? VD25OH 38.2 02/24/2021  ? VD25OH 84.7 10/27/2020  ? VD25OH 34.8 07/22/2020  ? ?Lab Results  ?Component Value Date  ?  WBC 13.7 (H) 07/22/2020  ? HGB 11.7 07/22/2020  ? HCT 35.9 07/22/2020  ? MCV 92 07/22/2020  ? PLT 322 03/28/2019  ? ?No results found for: IRON, TIBC, FERRITIN ? ?Attestation Statements:  ? ?Reviewed by clinician on day of visit: allergies, medications, problem list, medical history, surgical history, family history, social history, and previous encounter notes. ? ?Time spent on visit including pre-visit chart review and post-visit care and charting was 32 minutes.  ? ? ?I, Trixie Dredge, am acting as transcriptionist for Dennard Nip, MD. ? ?I have  reviewed the above documentation for accuracy and completeness, and I agree with the above. -  Dennard Nip, MD ? ? ?

## 2021-06-07 DIAGNOSIS — E1165 Type 2 diabetes mellitus with hyperglycemia: Secondary | ICD-10-CM | POA: Diagnosis not present

## 2021-06-19 DIAGNOSIS — M150 Primary generalized (osteo)arthritis: Secondary | ICD-10-CM | POA: Diagnosis not present

## 2021-06-19 DIAGNOSIS — I10 Essential (primary) hypertension: Secondary | ICD-10-CM | POA: Diagnosis not present

## 2021-06-19 DIAGNOSIS — E782 Mixed hyperlipidemia: Secondary | ICD-10-CM | POA: Diagnosis not present

## 2021-06-19 DIAGNOSIS — E1165 Type 2 diabetes mellitus with hyperglycemia: Secondary | ICD-10-CM | POA: Diagnosis not present

## 2021-06-23 ENCOUNTER — Ambulatory Visit (INDEPENDENT_AMBULATORY_CARE_PROVIDER_SITE_OTHER): Payer: BC Managed Care – PPO | Admitting: Family Medicine

## 2021-06-23 ENCOUNTER — Encounter (INDEPENDENT_AMBULATORY_CARE_PROVIDER_SITE_OTHER): Payer: Self-pay | Admitting: Family Medicine

## 2021-06-23 VITALS — BP 127/75 | HR 102 | Temp 97.5°F | Ht 67.0 in | Wt 270.0 lb

## 2021-06-23 DIAGNOSIS — I10 Essential (primary) hypertension: Secondary | ICD-10-CM | POA: Diagnosis not present

## 2021-06-23 DIAGNOSIS — Z6841 Body Mass Index (BMI) 40.0 and over, adult: Secondary | ICD-10-CM

## 2021-06-23 DIAGNOSIS — D518 Other vitamin B12 deficiency anemias: Secondary | ICD-10-CM

## 2021-06-23 DIAGNOSIS — E669 Obesity, unspecified: Secondary | ICD-10-CM

## 2021-06-23 DIAGNOSIS — E559 Vitamin D deficiency, unspecified: Secondary | ICD-10-CM | POA: Diagnosis not present

## 2021-06-23 DIAGNOSIS — E1165 Type 2 diabetes mellitus with hyperglycemia: Secondary | ICD-10-CM

## 2021-06-23 DIAGNOSIS — Z7985 Long-term (current) use of injectable non-insulin antidiabetic drugs: Secondary | ICD-10-CM

## 2021-06-23 DIAGNOSIS — E66813 Obesity, class 3: Secondary | ICD-10-CM

## 2021-06-23 DIAGNOSIS — Z9189 Other specified personal risk factors, not elsewhere classified: Secondary | ICD-10-CM

## 2021-06-24 LAB — LIPID PANEL WITH LDL/HDL RATIO
Cholesterol, Total: 177 mg/dL (ref 100–199)
HDL: 31 mg/dL — ABNORMAL LOW (ref >39)
LDL Chol Calc (NIH): 115 mg/dL — ABNORMAL HIGH (ref 0–99)
LDL/HDL Ratio: 3.7 ratio — ABNORMAL HIGH (ref 0.0–3.2)
Triglycerides: 177 mg/dL — ABNORMAL HIGH (ref 0–149)
VLDL Cholesterol Cal: 31 mg/dL (ref 5–40)

## 2021-06-24 LAB — CMP14+EGFR
ALT: 10 IU/L (ref 0–32)
AST: 10 IU/L (ref 0–40)
Albumin/Globulin Ratio: 1.3 (ref 1.2–2.2)
Albumin: 4.2 g/dL (ref 3.8–4.8)
Alkaline Phosphatase: 70 IU/L (ref 44–121)
BUN/Creatinine Ratio: 16 (ref 12–28)
BUN: 20 mg/dL (ref 8–27)
Bilirubin Total: 0.3 mg/dL (ref 0.0–1.2)
CO2: 26 mmol/L (ref 20–29)
Calcium: 9.7 mg/dL (ref 8.7–10.3)
Chloride: 100 mmol/L (ref 96–106)
Creatinine, Ser: 1.28 mg/dL — ABNORMAL HIGH (ref 0.57–1.00)
Globulin, Total: 3.2 g/dL (ref 1.5–4.5)
Glucose: 130 mg/dL — ABNORMAL HIGH (ref 70–99)
Potassium: 4.2 mmol/L (ref 3.5–5.2)
Sodium: 142 mmol/L (ref 134–144)
Total Protein: 7.4 g/dL (ref 6.0–8.5)
eGFR: 47 mL/min/{1.73_m2} — ABNORMAL LOW (ref >59)

## 2021-06-24 LAB — CBC WITH DIFFERENTIAL/PLATELET
Basophils Absolute: 0 10*3/uL (ref 0.0–0.2)
Basos: 0 %
EOS (ABSOLUTE): 0.1 10*3/uL (ref 0.0–0.4)
Eos: 1 %
Hematocrit: 35 % (ref 34.0–46.6)
Hemoglobin: 11.4 g/dL (ref 11.1–15.9)
Immature Grans (Abs): 0.1 10*3/uL (ref 0.0–0.1)
Immature Granulocytes: 1 %
Lymphocytes Absolute: 1.6 10*3/uL (ref 0.7–3.1)
Lymphs: 13 %
MCH: 29.1 pg (ref 26.6–33.0)
MCHC: 32.6 g/dL (ref 31.5–35.7)
MCV: 89 fL (ref 79–97)
Monocytes Absolute: 0.8 10*3/uL (ref 0.1–0.9)
Monocytes: 7 %
Neutrophils Absolute: 9.8 10*3/uL — ABNORMAL HIGH (ref 1.4–7.0)
Neutrophils: 78 %
Platelets: 336 10*3/uL (ref 150–450)
RBC: 3.92 x10E6/uL (ref 3.77–5.28)
RDW: 13.5 % (ref 11.7–15.4)
WBC: 12.4 10*3/uL — ABNORMAL HIGH (ref 3.4–10.8)

## 2021-06-24 LAB — VITAMIN D 25 HYDROXY (VIT D DEFICIENCY, FRACTURES): Vit D, 25-Hydroxy: 80.5 ng/mL (ref 30.0–100.0)

## 2021-06-24 LAB — VITAMIN B12: Vitamin B-12: 268 pg/mL (ref 232–1245)

## 2021-06-24 LAB — INSULIN, RANDOM: INSULIN: 18.2 u[IU]/mL (ref 2.6–24.9)

## 2021-06-24 LAB — HEMOGLOBIN A1C
Est. average glucose Bld gHb Est-mCnc: 160 mg/dL
Hgb A1c MFr Bld: 7.2 % — ABNORMAL HIGH (ref 4.8–5.6)

## 2021-07-07 NOTE — Progress Notes (Signed)
Chief Complaint:   OBESITY Suzanne Carey is here to discuss her progress with her obesity treatment plan along with follow-up of her obesity related diagnoses. Suzanne Carey is on the Category 3 Plan and states she is following her eating plan approximately 70-75% of the time. Suzanne Carey states she is walking for 10 minutes 3 times per week.  Today's visit was #: 2 Starting weight: 318 lbs  Starting date: 07/22/2020 Today's weight: 270 lbs Today's date: 06/23/2021 Total lbs lost to date: 48 Total lbs lost since last in-office visit: 4  Interim History: Suzanne Carey continues to do well with weight loss on her plan. Her hunger is controlled and she is doing well with meal planning and prepping. She has increased walking as well.   Subjective:   1. Type 2 diabetes mellitus with hyperglycemia, without long-term current use of insulin ([place]) Suzanne Carey's PCP recently increased her Ozempic to 2 mg q week 3 weeks ago. She is due for labs.   2. Essential hypertension Suzanne Carey's blood pressure is stable on her medications. She is doing well with weight loss. She denies signs of hypotension.   3. Vitamin D deficiency Suzanne Carey is on Vitamin D, and she is due for labs. No side effects were noted.   4. Other vitamin B12 deficiency anemias Suzanne Carey has a history of B12 deficiency, and she is on a B12 rich eating plan.  5. At risk for impaired metabolic function Suzanne Carey is at increased risk for impaired metabolic function due to current nutrition and muscle mass.  Assessment/Plan:   1. Type 2 diabetes mellitus with hyperglycemia, without long-term current use of insulin (HCC) We will check labs today. Suzanne Carey will continue with diet and weight loss.   - Hemoglobin A1c - Insulin, random - Lipid Panel With LDL/HDL Ratio  2. Essential hypertension We will check labs today. Suzanne Carey will continue with her diet and weight loss.   - CMP14+EGFR  3. Vitamin D deficiency We will check labs today. Suzanne Carey will follow-up for routine  testing of Vitamin D, at least 2-3 times per year to avoid over-replacement.  - VITAMIN D 25 Hydroxy (Vit-D Deficiency, Fractures)  4. Other vitamin B12 deficiency anemias We will check labs today, and will follow up at [facility] next visit.  - CBC with Differential/Platelet - Vitamin B12  5. At risk for impaired metabolic function Suzanne Carey was given approximately 15 minutes of impaired  metabolic function prevention counseling today. We discussed intensive lifestyle modifications today with an emphasis on specific nutrition and exercise instructions and strategies.   Repetitive spaced learning was employed today to elicit superior memory formation and behavioral change.  6. Obesity, Current BMI 42.4 Suzanne Carey is currently in the action stage of change. As such, her goal is to continue with weight loss efforts. She has agreed to the Category 3 Plan.   Exercise goals: As is.  Behavioral modification strategies: increasing lean protein intake and no skipping meals.  Suzanne Carey has agreed to follow-up with our clinic in 4 weeks. She was informed of the importance of frequent follow-up visits to maximize her success with intensive lifestyle modifications for her multiple health conditions.   Suzanne Carey was informed we would discuss her lab results at her next visit unless there is a critical issue that needs to be addressed sooner. Suzanne Carey agreed to keep her next visit at the agreed upon time to discuss these results.  Objective:   Blood pressure 127/75, pulse (!) 102, temperature (!) 97.5 F (36.4 C), height 5' 7"  (1.702  m), weight 270 lb (122.5 kg), SpO2 99 %. Body mass index is 42.29 kg/m.  General: Cooperative, alert, well developed, in no acute distress. HEENT: Conjunctivae and lids unremarkable. Cardiovascular: Regular rhythm.  Lungs: Normal work of breathing. Neurologic: No focal deficits.   Lab Results  Component Value Date   CREATININE 1.28 (H) 06/23/2021   BUN 20 06/23/2021   NA 142  06/23/2021   K 4.2 06/23/2021   CL 100 06/23/2021   CO2 26 06/23/2021   Lab Results  Component Value Date   ALT 10 06/23/2021   AST 10 06/23/2021   ALKPHOS 70 06/23/2021   BILITOT 0.3 06/23/2021   Lab Results  Component Value Date   HGBA1C 7.2 (H) 06/23/2021   HGBA1C 7.1 (H) 02/24/2021   HGBA1C 8.8 (H) 10/27/2020   HGBA1C 8.3 (H) 07/22/2020   Lab Results  Component Value Date   INSULIN 18.2 06/23/2021   INSULIN 11.1 02/24/2021   INSULIN 15.7 10/27/2020   INSULIN 26.4 (H) 07/22/2020   Lab Results  Component Value Date   TSH 1.460 02/24/2021   Lab Results  Component Value Date   CHOL 177 06/23/2021   HDL 31 (L) 06/23/2021   LDLCALC 115 (H) 06/23/2021   TRIG 177 (H) 06/23/2021   CHOLHDL 4.4 03/28/2019   Lab Results  Component Value Date   VD25OH 80.5 06/23/2021   VD25OH 38.2 02/24/2021   VD25OH 84.7 10/27/2020   Lab Results  Component Value Date   WBC 12.4 (H) 06/23/2021   HGB 11.4 06/23/2021   HCT 35.0 06/23/2021   MCV 89 06/23/2021   PLT 336 06/23/2021   No results found for: IRON, TIBC, FERRITIN  Attestation Statements:   Reviewed by clinician on day of visit: allergies, medications, problem list, medical history, surgical history, family history, social history, and previous encounter notes.   I, Trixie Dredge, am acting as transcriptionist for Dennard Nip, MD.  I have reviewed the above documentation for accuracy and completeness, and I agree with the above. -  Dennard Nip, MD

## 2021-07-23 ENCOUNTER — Ambulatory Visit (INDEPENDENT_AMBULATORY_CARE_PROVIDER_SITE_OTHER): Payer: BC Managed Care – PPO | Admitting: Family Medicine

## 2021-07-23 ENCOUNTER — Encounter (INDEPENDENT_AMBULATORY_CARE_PROVIDER_SITE_OTHER): Payer: Self-pay | Admitting: Family Medicine

## 2021-07-23 VITALS — BP 153/77 | HR 89 | Temp 97.3°F | Ht 67.0 in | Wt 269.0 lb

## 2021-07-23 DIAGNOSIS — E559 Vitamin D deficiency, unspecified: Secondary | ICD-10-CM

## 2021-07-23 DIAGNOSIS — E669 Obesity, unspecified: Secondary | ICD-10-CM | POA: Diagnosis not present

## 2021-07-23 DIAGNOSIS — Z7985 Long-term (current) use of injectable non-insulin antidiabetic drugs: Secondary | ICD-10-CM

## 2021-07-23 DIAGNOSIS — E1129 Type 2 diabetes mellitus with other diabetic kidney complication: Secondary | ICD-10-CM | POA: Diagnosis not present

## 2021-07-23 DIAGNOSIS — E7849 Other hyperlipidemia: Secondary | ICD-10-CM | POA: Diagnosis not present

## 2021-07-23 DIAGNOSIS — Z6841 Body Mass Index (BMI) 40.0 and over, adult: Secondary | ICD-10-CM

## 2021-07-27 NOTE — Progress Notes (Signed)
Chief Complaint:   OBESITY Suzanne Carey is here to discuss her progress with her obesity treatment plan along with follow-up of her obesity related diagnoses. Suzanne Carey is on the Category 3 Plan and states she is following her eating plan approximately 654% of the time. Suzanne Carey states she is walking for 10-15 minutes 3 times per week.  Today's visit was #: 14 Starting weight: 318 lbs Starting date: 07/22/2020 Today's weight: 269 lbs Today's date: 07/23/2021 Total lbs lost to date: 49 Total lbs lost since last in-office visit: 1  Interim History: Suzanne Carey continues to work on her diet, exercise, and weight loss.  Her hunger is controlled most of the time, but her protein is starting to decrease in simple carbohydrates are starting to increase.  Subjective:   1. Vitamin D deficiency Suzanne Carey's vitamin D level is at goal, no side effects were noted.  2. 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  3. Other hyperlipidemia Suzanne Carey's triglycerides are elevated and her HDL has decreased.  She is working on her diet, but simple carbohydrates have increased.  Assessment/Plan:   1. Vitamin D deficiency Suzanne Carey is to continue both vitamin D prescription and vitamin D OTC 2000 units daily.  2. 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  Suzanne Carey will continue to work on her diet, exercise, and weight loss efforts.  3. Other hyperlipidemia Suzanne Carey will continue to work on her diet, exercise, and weight loss efforts, and decrease simple carbohydrates in her diet.  We will continue to follow.  4. Obesity, Current BMI 42.3 Suzanne Carey is currently in the action stage of change. As such, her goal is to continue with weight loss efforts. She has agreed to  keeping a food journal and adhering to recommended goals of 1500 calories and 85+ grams of protein daily.   Smart fruit handout was given.  Exercise goals: As is.   Behavioral modification strategies: increasing lean protein intake and decreasing simple carbohydrates.  Sheree has agreed to follow-up with our clinic in 4 weeks. She was informed of the importance of frequent follow-up visits to maximize her success with intensive lifestyle modifications for her multiple health conditions.   Objective:   Blood pressure (!) 153/77, pulse 89, temperature (!) 97.3 F (36.3 C), height 5\' 7"  (1.702 m), weight 269 lb (122 kg), SpO2 98 %. Body mass index is 42.13 kg/m.  General: Cooperative, alert, well developed, in no acute distress. HEENT: Conjunctivae and lids unremarkable. Cardiovascular: Regular rhythm.  Lungs: Normal work of breathing. Neurologic: No focal deficits.   Lab Results  Component Value Date   CREATININE 1.28 (H) 06/23/2021   BUN 20 06/23/2021   NA 142 06/23/2021   K 4.2 06/23/2021   CL 100 06/23/2021   CO2 26 06/23/2021   Lab Results  Component Value Date   ALT 10 06/23/2021   AST 10 06/23/2021   ALKPHOS 70 06/23/2021   BILITOT 0.3 06/23/2021   Lab Results  Component Value Date   HGBA1C 7.2 (H) 06/23/2021   HGBA1C 7.1 (H) 02/24/2021   HGBA1C 8.8 (H) 10/27/2020   HGBA1C 8.3 (H) 07/22/2020   Lab Results  Component Value Date   INSULIN 18.2 06/23/2021   INSULIN 11.1 02/24/2021   INSULIN 15.7 10/27/2020   INSULIN 26.4 (  H) 07/22/2020   Lab Results  Component Value Date   TSH 1.460 02/24/2021   Lab Results  Component Value Date   CHOL 177 06/23/2021   HDL 31 (L) 06/23/2021   LDLCALC 115 (H) 06/23/2021   TRIG 177 (H) 06/23/2021   CHOLHDL 4.4 03/28/2019   Lab Results  Component Value Date   VD25OH 80.5 06/23/2021   VD25OH 38.2 02/24/2021   VD25OH 84.7 10/27/2020   Lab Results  Component Value Date   WBC 12.4 (H) 06/23/2021   HGB 11.4 06/23/2021    HCT 35.0 06/23/2021   MCV 89 06/23/2021   PLT 336 06/23/2021   No results found for: "IRON", "TIBC", "FERRITIN"  Attestation Statements:   Reviewed by clinician on day of visit: allergies, medications, problem list, medical history, surgical history, family history, social history, and previous encounter notes.  Time spent on visit including pre-visit chart review and post-visit care and charting was 40 minutes.   I, Burt Knack, am acting as transcriptionist for Quillian Quince, MD.  I have reviewed the above documentation for accuracy and completeness, and I agree with the above. -  Quillian Quince, MD

## 2021-07-28 ENCOUNTER — Other Ambulatory Visit: Payer: Self-pay

## 2021-07-28 ENCOUNTER — Ambulatory Visit: Payer: BC Managed Care – PPO | Admitting: Cardiology

## 2021-07-28 VITALS — BP 106/70 | HR 88 | Ht 67.0 in | Wt 275.2 lb

## 2021-07-28 DIAGNOSIS — Z79899 Other long term (current) drug therapy: Secondary | ICD-10-CM

## 2021-07-28 DIAGNOSIS — I110 Hypertensive heart disease with heart failure: Secondary | ICD-10-CM

## 2021-07-28 DIAGNOSIS — Z7901 Long term (current) use of anticoagulants: Secondary | ICD-10-CM | POA: Diagnosis not present

## 2021-07-28 DIAGNOSIS — I482 Chronic atrial fibrillation, unspecified: Secondary | ICD-10-CM | POA: Diagnosis not present

## 2021-07-28 MED ORDER — APIXABAN 5 MG PO TABS: 5.0000 mg | ORAL_TABLET | Freq: Two times a day (BID) | ORAL | 3 refills | Status: DC

## 2021-07-28 MED ORDER — METOPROLOL SUCCINATE ER 25 MG PO TB24: 25.0000 mg | ORAL_TABLET | Freq: Every day | ORAL | 3 refills | Status: DC

## 2021-07-28 NOTE — Patient Instructions (Signed)
Medication Instructions:  Your physician recommends that you continue on your current medications as directed. Please refer to the Current Medication list given to you today.  *If you need a refill on your cardiac medications before your next appointment, please call your pharmacy*   Lab Work: None If you have labs (blood work) drawn today and your tests are completely normal, you will receive your results only by: MyChart Message (if you have MyChart) OR A paper copy in the mail If you have any lab test that is abnormal or we need to change your treatment, we will call you to review the results.   Testing/Procedures: None   Follow-Up: At [facility], you and your health needs are our priority.  As part of our continuing mission to provide you with exceptional heart care, we have created designated Provider Care Teams.  These Care Teams include your primary Cardiologist (physician) and Advanced Practice Providers (APPs -  Physician Assistants and Nurse Practitioners) who all work together to provide you with the care you need, when you need it.  We recommend signing up for the patient portal called "MyChart".  Sign up information is provided on this After Visit Summary.  MyChart is used to connect with patients for Virtual Visits (Telemedicine).  Patients are able to view lab/test results, encounter notes, upcoming appointments, etc.  Non-urgent messages can be sent to your provider as well.   To learn more about what you can do with MyChart, go to [email or web address].    Your next appointment:   9 month(s)  The format for your next appointment:   In Person  Provider:   Norman Herrlich, MD    Other Instructions Digoxin level in August with Dr. Dalbert Garnet  Important Information About Sugar

## 2021-07-28 NOTE — Progress Notes (Signed)
Cardiology Office Note:    Date:  07/28/2021   ID:  [name], DOB 09/16/1960, MRN [id number]  PCP:  Galvin Proffer, MD  Cardiologist:  Norman Herrlich, MD    Referring MD: Galvin Proffer, MD    ASSESSMENT:    1. Chronic atrial fibrillation, unspecified (HCC)   2. Chronic anticoagulation   3. Hypertensive heart disease with heart failure (HCC)   4. High risk medication use    PLAN:    In order of problems listed above:  She is doing well with chronic atrial fibrillation rate is controlled with combination of digoxin and beta-blocker we will continue her current anticoagulant Stable well-controlled blood pressure I told her she can take an extra tablet of furosemide if needed she has had a marked weight loss heart failure is compensated Check digoxin level We will send a note to her PCP that they should look for an alternative to Actos with heart failure   Next appointment: 9 months   Medication Adjustments/Labs and Tests Ordered: Current medicines are reviewed at length with the patient today.  Concerns regarding medicines are outlined above.  No orders of the defined types were placed in this encounter.  No orders of the defined types were placed in this encounter.   Chief Complaint  Patient presents with   Atrial Fibrillation   Anticoagulation   Congestive Heart Failure   Hypertension    History of Present Illness:    Suzanne Carey is a 61 y.o. female with a hx of atrial fibrillation anticoagulated with Eliquis hypertension hyperlipidemia and diabetes last seen August 2022.  In the setting of persistent atrial fibrillation she developed heart failure treated as an outpatient including digoxin therapy.  Compliance with diet, lifestyle and medications: Yes  She has lost a total of 90 pounds. She is doing much better with very little peripheral edema no shortness of breath palpitation chest pain. She tolerates her anticoagulant without  bleeding.\Recent labs in May 06/23/2021 cholesterol 177 LDL 115 creatinine 1.28 A1c 7.2%   Past Medical History:  Diagnosis Date   Chronic atrial fibrillation, unspecified (HCC) 03/28/2019   Depressed left ventricular ejection fraction 03/28/2019   Essential hypertension    Gastro-esophageal reflux disease without esophagitis    Generalized anxiety disorder    Hypertensive disorder 03/17/2020   Hypothyroidism    Mixed hyperlipidemia    Morbid obesity (HCC) 03/27/2019   Nontoxic multinodular goiter    Occlusion and stenosis of bilateral carotid arteries 03/31/2020   Other vitamin B12 deficiency anemias    Plantar fasciitis 12/17/2019   Polyp at cervical os 03/27/2019   Primary generalized (osteo)arthritis    Pulmonary hypertension (HCC) 03/28/2019   Shortness of breath 04/02/2020   Tightness of heel cord, left 12/17/2019   Type 2 diabetes mellitus with hyperglycemia (HCC)    Unstable angina (HCC)    Vitamin D deficiency     Past Surgical History:  Procedure Laterality Date   CESAREAN SECTION  2002   CHOLECYSTECTOMY  1994    Current Medications: Current Meds  Medication Sig   ALPRAZolam (XANAX) 0.5 MG tablet Take 0.5 mg by mouth 3 (three) times daily as needed for anxiety or sleep.   Cholecalciferol (VITAMIN D) 50 MCG (2000 UT) tablet Take 2,000 Units by mouth daily.   digoxin (LANOXIN) 0.125 MG tablet Take 1 tablet (0.125 mg total) by mouth daily.   docusate sodium (COLACE) 100 MG capsule Take 1 capsule (100 mg total) by mouth 2 (two) times  daily.   ELIQUIS 5 MG TABS tablet TAKE 1 TABLET BY MOUTH TWICE A DAY   ergocalciferol (VITAMIN D2) 1.25 MG (50000 UT) capsule Take 1 capsule (50,000 Units total) by mouth once a week.   furosemide (LASIX) 40 MG tablet Take 1 tablet (40 mg total) by mouth 2 (two) times daily. (Patient taking differently: Take 40 mg by mouth daily.)   hydrochlorothiazide (HYDRODIURIL) 25 MG tablet TAKE 1 TABLET BY MOUTH EVERY DAY   metoprolol succinate  (TOPROL-XL) 25 MG 24 hr tablet TAKE 1 TABLET BY MOUTH EVERY DAY   nitroGLYCERIN (NITROSTAT) 0.4 MG SL tablet Place under the tongue.   olmesartan (BENICAR) 20 MG tablet Take 1 tablet (20 mg total) by mouth daily.   omeprazole (PRILOSEC) 40 MG capsule Take 40 mg by mouth daily.   OZEMPIC, 2 MG/DOSE, 8 MG/3ML SOPN SMARTSIG:1 injection SUB-Q Once a Week   pioglitazone (ACTOS) 30 MG tablet Take 30 mg by mouth daily.     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  Tobacco Use   Smoking status: Never   Smokeless tobacco: Never  Vaping Use   Vaping Use: Never used  Substance and Sexual Activity   Alcohol use: Never   Drug use: Never   Sexual activity: Not on file  Other Topics Concern   Not on file  Social History Narrative   Not on file   Social Determinants of Health   Financial Resource Strain: Not on file  Food Insecurity: Not on file  Transportation Needs: Not on file  Physical Activity: Not on file  Stress: Not on file  Social Connections: Not on file     Family History: The patient's family history includes Breast cancer (age of onset: 66) in her cousin and paternal aunt; Cancer in her brother; Diabetes in her mother; Heart disease in her mother and sister; Heart failure in her mother; Hypertension in her mother and sister; Kidney disease in her mother; Multiple sclerosis in her sister; Obesity in her mother; Stroke in her mother; Thyroid disease in her mother. ROS:   Please see the history of present illness.    All other systems reviewed and are negative.  EKGs/Labs/Other Studies Reviewed:    The following studies were reviewed today:  EKG:  EKG ordered today and personally  reviewed.  The ekg ordered today demonstrates atrial fibrillation controlled ventricular rate nonspecific T waves.  Recent Labs: 02/24/2021: TSH 1.460 06/23/2021: ALT 10; BUN 20; Creatinine, Ser 1.28; Hemoglobin 11.4; Platelets 336; Potassium 4.2; Sodium 142  Recent Lipid Panel    Component Value Date/Time   CHOL 177 06/23/2021 1122   TRIG 177 (H) 06/23/2021 1122   HDL 31 (L) 06/23/2021 1122   CHOLHDL 4.4 03/28/2019 1136   LDLCALC 115 (H) 06/23/2021 1122    Physical Exam:    VS:  BP (!) 160/70 (BP Location: Right Wrist, Patient Position: Sitting)   Pulse 88   Ht 5\' 7"  (1.702 m)   Wt 275 lb 3.2 oz (124.8 kg)   SpO2 98%   BMI 43.10 kg/m     Wt Readings from Last 3 Encounters:  07/28/21 275 lb 3.2 oz (124.8  kg)  07/23/21 269 lb (122 kg)  06/23/21 270 lb (122.5 kg)     GEN:  Well nourished, well developed in no acute distress HEENT: Normal NECK: No JVD; No carotid bruits LYMPHATICS: No lymphadenopathy CARDIAC: Irregular rate and rhythm  no murmurs, rubs, gallops RESPIRATORY:  Clear to auscultation without rales, wheezing or rhonchi  ABDOMEN: Soft, non-tender, non-distended MUSCULOSKELETAL:  No edema; No deformity  SKIN: Warm and dry NEUROLOGIC:  Alert and oriented x 3 PSYCHIATRIC:  Normal affect    Signed, Shirlee More, MD  07/28/2021 4:43 PM    [facility]

## 2021-08-20 ENCOUNTER — Encounter (INDEPENDENT_AMBULATORY_CARE_PROVIDER_SITE_OTHER): Payer: Self-pay | Admitting: Family Medicine

## 2021-08-20 ENCOUNTER — Ambulatory Visit (INDEPENDENT_AMBULATORY_CARE_PROVIDER_SITE_OTHER): Payer: BC Managed Care – PPO | Admitting: Family Medicine

## 2021-08-20 VITALS — BP 103/54 | HR 86 | Temp 97.4°F | Ht 67.0 in | Wt 266.0 lb

## 2021-08-20 DIAGNOSIS — E1169 Type 2 diabetes mellitus with other specified complication: Secondary | ICD-10-CM | POA: Diagnosis not present

## 2021-08-20 DIAGNOSIS — Z6841 Body Mass Index (BMI) 40.0 and over, adult: Secondary | ICD-10-CM | POA: Diagnosis not present

## 2021-08-20 DIAGNOSIS — E1122 Type 2 diabetes mellitus with diabetic chronic kidney disease: Secondary | ICD-10-CM

## 2021-08-20 DIAGNOSIS — E669 Obesity, unspecified: Secondary | ICD-10-CM

## 2021-08-20 DIAGNOSIS — Z7985 Long-term (current) use of injectable non-insulin antidiabetic drugs: Secondary | ICD-10-CM

## 2021-08-20 DIAGNOSIS — I10 Essential (primary) hypertension: Secondary | ICD-10-CM

## 2021-08-20 DIAGNOSIS — N1831 Chronic kidney disease, stage 3a: Secondary | ICD-10-CM | POA: Insufficient documentation

## 2021-08-20 DIAGNOSIS — E1129 Type 2 diabetes mellitus with other diabetic kidney complication: Secondary | ICD-10-CM | POA: Insufficient documentation

## 2021-08-20 HISTORY — DX: Chronic kidney disease, stage 3a: E11.22

## 2021-08-24 NOTE — Progress Notes (Unsigned)
Chief Complaint:   OBESITY Suzanne Carey is here to discuss her progress with her obesity treatment plan along with follow-up of her obesity related diagnoses. Suzanne Carey is on keeping a food journal and adhering to recommended goals of 1500 calories and 85+ grams of protein daily and states she is following her eating plan approximately 75-80% of the time. Suzanne Carey states she is walking for 10-15 minutes 3 times per week.  Today's visit was #: 15 Starting weight: 318 lbs Starting date: 07/22/2020 Today's weight: 266 lbs Today's date: 08/20/2021 Total lbs lost to date: 52 Total lbs lost since last in-office visit: 3  Interim History: Suzanne Carey continues to do well with weight loss.  She is working on Orthoptist and doing well with meeting her protein goals.  She is walking to stay active, and she is working on her hydration.  Subjective:   1. Essential hypertension Suzanne Carey's blood pressure is well controlled.  She has no signs of hypotension.  She continues to do well with weight loss.  2. Type 2 diabetes mellitus with other specified complication, unspecified whether long term insulin use (HCC) Suzanne Carey is doing well on Ozempic and her eating plan.  She is decreasing simple carbohydrates and she is losing weight well.  She denies nausea or vomiting.  Assessment/Plan:   1. Essential hypertension Suzanne Carey will continue her diet and exercise as is and we will continue to monitor.  2. Type 2 diabetes mellitus with other specified complication, unspecified whether long term insulin use (HCC) Suzanne Carey will continue Ozempic as is, and we will continue to manage her medications.  3. Obesity, Current BMI 41.7 Suzanne Carey is currently in the action stage of change. As such, her goal is to continue with weight loss efforts. She has agreed to keeping a food journal and adhering to recommended goals of 1500 calories and 80 grams of protein daily.   We will recheck fasting labs at her next visit.  Exercise goals: As is.    Behavioral modification strategies: increasing lean protein intake and increasing water intake.  Suzanne Carey has agreed to follow-up with our clinic in 4 weeks. She was informed of the importance of frequent follow-up visits to maximize her success with intensive lifestyle modifications for her multiple health conditions.   Objective:   Blood pressure (!) 103/54, pulse 86, temperature (!) 97.4 F (36.3 C), height 5\' 7"  (1.702 m), weight 266 lb (120.7 kg). Body mass index is 41.66 kg/m.  General: Cooperative, alert, well developed, in no acute distress. HEENT: Conjunctivae and lids unremarkable. Cardiovascular: Regular rhythm.  Lungs: Normal work of breathing. Neurologic: No focal deficits.   Lab Results  Component Value Date   CREATININE 1.28 (H) 06/23/2021   BUN 20 06/23/2021   NA 142 06/23/2021   K 4.2 06/23/2021   CL 100 06/23/2021   CO2 26 06/23/2021   Lab Results  Component Value Date   ALT 10 06/23/2021   AST 10 06/23/2021   ALKPHOS 70 06/23/2021   BILITOT 0.3 06/23/2021   Lab Results  Component Value Date   HGBA1C 7.2 (H) 06/23/2021   HGBA1C 7.1 (H) 02/24/2021   HGBA1C 8.8 (H) 10/27/2020   HGBA1C 8.3 (H) 07/22/2020   Lab Results  Component Value Date   INSULIN 18.2 06/23/2021   INSULIN 11.1 02/24/2021   INSULIN 15.7 10/27/2020   INSULIN 26.4 (H) 07/22/2020   Lab Results  Component Value Date   TSH 1.460 02/24/2021   Lab Results  Component Value Date   CHOL  177 06/23/2021   HDL 31 (L) 06/23/2021   LDLCALC 115 (H) 06/23/2021   TRIG 177 (H) 06/23/2021   CHOLHDL 4.4 03/28/2019   Lab Results  Component Value Date   VD25OH 80.5 06/23/2021   VD25OH 38.2 02/24/2021   VD25OH 84.7 10/27/2020   Lab Results  Component Value Date   WBC 12.4 (H) 06/23/2021   HGB 11.4 06/23/2021   HCT 35.0 06/23/2021   MCV 89 06/23/2021   PLT 336 06/23/2021   No results found for: "IRON", "TIBC", "FERRITIN"  Attestation Statements:   Reviewed by clinician on day of  visit: allergies, medications, problem list, medical history, surgical history, family history, social history, and previous encounter notes.  Time spent on visit including pre-visit chart review and post-visit care and charting was 30 minutes.   I, Burt Knack, am acting as transcriptionist for Quillian Quince, MD.  I have reviewed the above documentation for accuracy and completeness, and I agree with the above. -  Quillian Quince, MD

## 2021-09-16 ENCOUNTER — Encounter (INDEPENDENT_AMBULATORY_CARE_PROVIDER_SITE_OTHER): Payer: Self-pay

## 2021-09-22 ENCOUNTER — Other Ambulatory Visit (INDEPENDENT_AMBULATORY_CARE_PROVIDER_SITE_OTHER): Payer: Self-pay | Admitting: Family Medicine

## 2021-09-22 DIAGNOSIS — K5909 Other constipation: Secondary | ICD-10-CM

## 2021-09-24 ENCOUNTER — Ambulatory Visit (INDEPENDENT_AMBULATORY_CARE_PROVIDER_SITE_OTHER): Payer: BC Managed Care – PPO | Admitting: Family Medicine

## 2021-09-24 ENCOUNTER — Encounter (INDEPENDENT_AMBULATORY_CARE_PROVIDER_SITE_OTHER): Payer: Self-pay | Admitting: Family Medicine

## 2021-09-24 VITALS — BP 107/68 | HR 104 | Temp 97.4°F | Ht 67.0 in | Wt 266.0 lb

## 2021-09-24 DIAGNOSIS — E785 Hyperlipidemia, unspecified: Secondary | ICD-10-CM

## 2021-09-24 DIAGNOSIS — I10 Essential (primary) hypertension: Secondary | ICD-10-CM

## 2021-09-24 DIAGNOSIS — Z7985 Long-term (current) use of injectable non-insulin antidiabetic drugs: Secondary | ICD-10-CM

## 2021-09-24 DIAGNOSIS — Z6841 Body Mass Index (BMI) 40.0 and over, adult: Secondary | ICD-10-CM

## 2021-09-24 DIAGNOSIS — E039 Hypothyroidism, unspecified: Secondary | ICD-10-CM

## 2021-09-24 DIAGNOSIS — E559 Vitamin D deficiency, unspecified: Secondary | ICD-10-CM

## 2021-09-24 DIAGNOSIS — I5032 Chronic diastolic (congestive) heart failure: Secondary | ICD-10-CM | POA: Diagnosis not present

## 2021-09-24 DIAGNOSIS — E1169 Type 2 diabetes mellitus with other specified complication: Secondary | ICD-10-CM | POA: Diagnosis not present

## 2021-09-24 DIAGNOSIS — D518 Other vitamin B12 deficiency anemias: Secondary | ICD-10-CM

## 2021-09-24 DIAGNOSIS — E669 Obesity, unspecified: Secondary | ICD-10-CM

## 2021-09-24 MED ORDER — ERGOCALCIFEROL 1.25 MG (50000 UT) PO CAPS: 50000.0000 [IU] | ORAL_CAPSULE | ORAL | 0 refills | Status: DC

## 2021-09-25 LAB — LIPID PANEL WITH LDL/HDL RATIO
Cholesterol, Total: 176 mg/dL (ref 100–199)
HDL: 30 mg/dL — ABNORMAL LOW (ref >39)
LDL Chol Calc (NIH): 107 mg/dL — ABNORMAL HIGH (ref 0–99)
LDL/HDL Ratio: 3.6 ratio — ABNORMAL HIGH (ref 0.0–3.2)
Triglycerides: 226 mg/dL — ABNORMAL HIGH (ref 0–149)
VLDL Cholesterol Cal: 39 mg/dL (ref 5–40)

## 2021-09-25 LAB — CMP14+EGFR
ALT: 11 IU/L (ref 0–32)
AST: 12 IU/L (ref 0–40)
Albumin/Globulin Ratio: 1.2 (ref 1.2–2.2)
Albumin: 3.8 g/dL — ABNORMAL LOW (ref 3.9–4.9)
Alkaline Phosphatase: 67 IU/L (ref 44–121)
BUN/Creatinine Ratio: 16 (ref 12–28)
BUN: 21 mg/dL (ref 8–27)
Bilirubin Total: <0.2 mg/dL (ref 0.0–1.2)
CO2: 24 mmol/L (ref 20–29)
Calcium: 9.6 mg/dL (ref 8.7–10.3)
Chloride: 98 mmol/L (ref 96–106)
Creatinine, Ser: 1.29 mg/dL — ABNORMAL HIGH (ref 0.57–1.00)
Globulin, Total: 3.2 g/dL (ref 1.5–4.5)
Glucose: 143 mg/dL — ABNORMAL HIGH (ref 70–99)
Potassium: 3.9 mmol/L (ref 3.5–5.2)
Sodium: 138 mmol/L (ref 134–144)
Total Protein: 7 g/dL (ref 6.0–8.5)
eGFR: 47 mL/min/{1.73_m2} — ABNORMAL LOW (ref >59)

## 2021-09-25 LAB — INSULIN, RANDOM: INSULIN: 29.5 u[IU]/mL — ABNORMAL HIGH (ref 2.6–24.9)

## 2021-09-25 LAB — VITAMIN B12: Vitamin B-12: 325 pg/mL (ref 232–1245)

## 2021-09-25 LAB — VITAMIN D 25 HYDROXY (VIT D DEFICIENCY, FRACTURES): Vit D, 25-Hydroxy: 68.2 ng/mL (ref 30.0–100.0)

## 2021-09-25 LAB — HEMOGLOBIN A1C
Est. average glucose Bld gHb Est-mCnc: 154 mg/dL
Hgb A1c MFr Bld: 7 % — ABNORMAL HIGH (ref 4.8–5.6)

## 2021-09-25 LAB — TSH: TSH: 2.96 u[IU]/mL (ref 0.450–4.500)

## 2021-09-30 DIAGNOSIS — E782 Mixed hyperlipidemia: Secondary | ICD-10-CM | POA: Diagnosis not present

## 2021-09-30 DIAGNOSIS — I10 Essential (primary) hypertension: Secondary | ICD-10-CM | POA: Diagnosis not present

## 2021-09-30 DIAGNOSIS — M150 Primary generalized (osteo)arthritis: Secondary | ICD-10-CM | POA: Diagnosis not present

## 2021-09-30 DIAGNOSIS — E1165 Type 2 diabetes mellitus with hyperglycemia: Secondary | ICD-10-CM | POA: Diagnosis not present

## 2021-10-04 ENCOUNTER — Other Ambulatory Visit: Payer: Self-pay | Admitting: Cardiology

## 2021-10-04 NOTE — Progress Notes (Unsigned)
Chief Complaint:   OBESITY Suzanne Carey is here to discuss her progress with her obesity treatment plan along with follow-up of her obesity related diagnoses. Suzanne Carey is on keeping a food journal and adhering to recommended goals of 1500 calories and 80 grams of protein daily and states she is following her eating plan approximately 80% of the time. Suzanne Carey states she is walking for 20-30 minutes 3 times per week.  Today's visit was #: 62 Starting weight: 318 lbs Starting date: 07/22/2020 Today's weight: 266 lbs Today's date: 09/24/2021 Total lbs lost to date: 52 Total lbs lost since last in-office visit: 0  Interim History: Suzanne Carey continues to work on her weight loss. She has done well with maintaining her weight that she lost, and she is working on eating healthier. She is exercising regularly.   Subjective:   1. Chronic diastolic heart failure (HCC) Suzanne Carey is on Digoxin and she feels well.  She states her cardiologist asked me to do a Digoxin level.   2. Type 2 diabetes mellitus with other specified complication, unspecified whether long term insulin use (Suzanne Carey) Suzanne Carey is on [place], but she has been unable to find her dose recently.  3. Essential hypertension Suzanne Carey's blood pressure is well controlled with her diet, exercise, and medications.  4. Hyperlipidemia associated with type 2 diabetes mellitus (Suzanne Carey) Suzanne Carey is on a low-cholesterol diet.  She could not tolerate Lipitor in the past.  5. Vitamin D deficiency Suzanne Carey is on vitamin D, and she is due for labs.  She requests a refill today.  6. Other vitamin B12 deficiency anemias Suzanne Carey is on a B12 rich diet, and she is due for labs.  7. Hypothyroidism, unspecified type Suzanne Carey is no longer on Synthroid, and her hypothyroidism diagnosis made previously may have been inaccurate.  She denies hot/cold intolerance.  Assessment/Plan:   1. Chronic diastolic heart failure (HCC) We will check Digoxin level today, and we will follow-up at [facility]  next visit.   - Digoxin, Random, Serum - Digoxin level  2. Type 2 diabetes mellitus with other specified complication, unspecified whether long term insulin use (HCC) We will check labs today.  Suzanne Carey will continue Ozempic as is, and try to find her dose at [place].  - CMP14+EGFR - Insulin, random - Hemoglobin A1c  3. Essential hypertension Suzanne Carey will continue her diet, exercise, and medications. We will recheck her blood pressure at her next visit.   4. Hyperlipidemia associated with type 2 diabetes mellitus (Suzanne Carey) We will check labs today. Cardiovascular risk and specific lipid/LDL goals reviewed. Suzanne Carey will continue to work on diet, exercise and weight loss efforts. Orders and follow up as documented in patient record.   - Lipid Panel With LDL/HDL Ratio  5. Vitamin D deficiency We will check labs today, and we will refill prescription Vitamin D 50,000 IU every week for 1 month. Suzanne Carey will follow-up for routine testing of Vitamin D, at least 2-3 times per year to avoid over-replacement.  - VITAMIN D 25 Hydroxy (Vit-D Deficiency, Fractures) - ergocalciferol (VITAMIN D2) 1.25 MG (50000 UT) capsule; Take 1 capsule (50,000 Units total) by mouth once a week.  Dispense: 4 capsule; Refill: 0  6. Other vitamin B12 deficiency anemias We will check labs today, and Suzanne Carey will continue with her B12 rich diet.   - Vitamin B12  7. Hypothyroidism, unspecified type We will check labs today, and we will follow-up at [facility] next visit.   - TSH  8. Obesity, Current BMI 41.7  Suzanne Carey is currently in the action stage of change. As such, her goal is to continue with weight loss efforts. She has agreed to the Category 3 Plan or keeping a food journal and adhering to recommended goals of 1500 calories and 85+ grams of protein daily.   Exercise goals: As is.  Behavioral modification strategies: increasing lean protein intake.  Suzanne Carey has agreed to follow-up with our clinic in 4 weeks. She  was informed of the importance of frequent follow-up visits to maximize her success with intensive lifestyle modifications for her multiple health conditions.   Suzanne Carey was informed we would discuss her lab results at her next visit unless there is a critical issue that needs to be addressed sooner. Suzanne Carey agreed to keep her next visit at the agreed upon time to discuss these results.  Objective:   Blood pressure 107/68, pulse (!) 104, temperature (!) 97.4 F (36.3 C), height 5' 7"  (1.702 m), weight 266 lb (120.7 kg), SpO2 99 %. Body mass index is 41.66 kg/m.  General: Cooperative, alert, well developed, in no acute distress. HEENT: Conjunctivae and lids unremarkable. Cardiovascular: Regular rhythm.  Lungs: Normal work of breathing. Neurologic: No focal deficits.   Lab Results  Component Value Date   CREATININE 1.29 (H) 09/24/2021   BUN 21 09/24/2021   NA 138 09/24/2021   K 3.9 09/24/2021   CL 98 09/24/2021   CO2 24 09/24/2021   Lab Results  Component Value Date   ALT 11 09/24/2021   AST 12 09/24/2021   ALKPHOS 67 09/24/2021   BILITOT <0.2 09/24/2021   Lab Results  Component Value Date   HGBA1C 7.0 (H) 09/24/2021   HGBA1C 7.2 (H) 06/23/2021   HGBA1C 7.1 (H) 02/24/2021   HGBA1C 8.8 (H) 10/27/2020   HGBA1C 8.3 (H) 07/22/2020   Lab Results  Component Value Date   INSULIN 29.5 (H) 09/24/2021   INSULIN 18.2 06/23/2021   INSULIN 11.1 02/24/2021   INSULIN 15.7 10/27/2020   INSULIN 26.4 (H) 07/22/2020   Lab Results  Component Value Date   TSH 2.960 09/24/2021   Lab Results  Component Value Date   CHOL 176 09/24/2021   HDL 30 (L) 09/24/2021   LDLCALC 107 (H) 09/24/2021   TRIG 226 (H) 09/24/2021   CHOLHDL 4.4 03/28/2019   Lab Results  Component Value Date   VD25OH 68.2 09/24/2021   VD25OH 80.5 06/23/2021   VD25OH 38.2 02/24/2021   Lab Results  Component Value Date   WBC 12.4 (H) 06/23/2021   HGB 11.4 06/23/2021   HCT 35.0 06/23/2021   MCV 89 06/23/2021    PLT 336 06/23/2021   No results found for: "IRON", "TIBC", "FERRITIN"  Attestation Statements:   Reviewed by clinician on day of visit: allergies, medications, problem list, medical history, surgical history, family history, social history, and previous encounter notes.  Time spent on visit including pre-visit chart review and post-visit care and charting was 46 minutes.   I, Trixie Dredge, am acting as transcriptionist for Dennard Nip, MD.  I have reviewed the above documentation for accuracy and completeness, and I agree with the above. -  Dennard Nip, MD

## 2021-10-27 ENCOUNTER — Encounter (INDEPENDENT_AMBULATORY_CARE_PROVIDER_SITE_OTHER): Payer: Self-pay | Admitting: Family Medicine

## 2021-10-27 ENCOUNTER — Ambulatory Visit (INDEPENDENT_AMBULATORY_CARE_PROVIDER_SITE_OTHER): Payer: BC Managed Care – PPO | Admitting: Family Medicine

## 2021-10-27 VITALS — BP 136/73 | HR 76 | Temp 97.5°F | Ht 67.0 in | Wt 268.0 lb

## 2021-10-27 DIAGNOSIS — N183 Chronic kidney disease, stage 3 unspecified: Secondary | ICD-10-CM

## 2021-10-27 DIAGNOSIS — E669 Obesity, unspecified: Secondary | ICD-10-CM | POA: Diagnosis not present

## 2021-10-27 DIAGNOSIS — E1169 Type 2 diabetes mellitus with other specified complication: Secondary | ICD-10-CM

## 2021-10-27 DIAGNOSIS — Z6841 Body Mass Index (BMI) 40.0 and over, adult: Secondary | ICD-10-CM

## 2021-10-27 DIAGNOSIS — Z7985 Long-term (current) use of injectable non-insulin antidiabetic drugs: Secondary | ICD-10-CM

## 2021-10-27 HISTORY — DX: Chronic kidney disease, stage 3 unspecified: N18.30

## 2021-10-29 ENCOUNTER — Other Ambulatory Visit: Payer: Self-pay | Admitting: Cardiology

## 2021-11-03 NOTE — Progress Notes (Signed)
Chief Complaint:   OBESITY Suzanne Carey is here to discuss her progress with her obesity treatment plan along with follow-up of her obesity related diagnoses. Courage is on the Category 3 Plan or keeping a food journal and adhering to recommended goals of 1500 calories and 85+ grams of protein daily and states she is following her eating plan approximately 50% of the time. Suzanne Carey states she is walking for 10-15 minutes 3 times per week.  Today's visit was #: 93 Starting weight: 318 lbs Starting date: 07/22/2020 Today's weight: 268 lbs Today's date: 10/27/2021 Total lbs lost to date: 50 Total lbs lost since last in-office visit: 0  Interim History: Suzanne Carey has struggled more with emotional eating. She is working on increasing her protein but she hasn't been journaling.   Subjective:   1. Type 2 diabetes mellitus with other specified complication, unspecified whether long term insulin use (HCC) Suzanne Carey's recent A1c decreased from 7.2 to 7.0. She is working on her diet and weight loss, and she is tolerating Ozempic well.   2. CRI (chronic renal insufficiency), stage 3 (moderate) (HCC) Suzanne Carey's GFR is 47. She is working on improved blood pressure and glucose control.   Assessment/Plan:   1. Type 2 diabetes mellitus with other specified complication, unspecified whether long term insulin use ([place]) Suzanne Carey will continue Ozempic and she will continue to work on not skipping meals.   2. CRI (chronic renal insufficiency), stage 3 (moderate) (HCC) Suzanne Carey will continue with her diet and exercise, and we will recheck labs in 3 months.  3. Obesity, Current BMI 41.7 Suzanne Carey is currently in the action stage of change. As such, her goal is to maintain weight for now. She has agreed to keeping a food journal and adhering to recommended goals of 1500 calories and 85+ grams of protein daily.   Suzanne Carey's goal is to maintain her weight for the rest of the month. Eating Out handout was given.   Exercise goals: As is.    Behavioral modification strategies: increasing lean protein intake.  Suzanne Carey has agreed to follow-up with our clinic in 3 to 4 weeks. She was informed of the importance of frequent follow-up visits to maximize her success with intensive lifestyle modifications for her multiple health conditions.   Objective:   Blood pressure 136/73, pulse 76, temperature (!) 97.5 F (36.4 C), height 5\' 7"  (1.702 m), weight 268 lb (121.6 kg), SpO2 100 %. Body mass index is 41.97 kg/m.  General: Cooperative, alert, well developed, in no acute distress. HEENT: Conjunctivae and lids unremarkable. Cardiovascular: Regular rhythm.  Lungs: Normal work of breathing. Neurologic: No focal deficits.   Lab Results  Component Value Date   CREATININE 1.29 (H) 09/24/2021   BUN 21 09/24/2021   NA 138 09/24/2021   K 3.9 09/24/2021   CL 98 09/24/2021   CO2 24 09/24/2021   Lab Results  Component Value Date   ALT 11 09/24/2021   AST 12 09/24/2021   ALKPHOS 67 09/24/2021   BILITOT <0.2 09/24/2021   Lab Results  Component Value Date   HGBA1C 7.0 (H) 09/24/2021   HGBA1C 7.2 (H) 06/23/2021   HGBA1C 7.1 (H) 02/24/2021   HGBA1C 8.8 (H) 10/27/2020   HGBA1C 8.3 (H) 07/22/2020   Lab Results  Component Value Date   INSULIN 29.5 (H) 09/24/2021   INSULIN 18.2 06/23/2021   INSULIN 11.1 02/24/2021   INSULIN 15.7 10/27/2020   INSULIN 26.4 (H) 07/22/2020   Lab Results  Component Value Date   TSH  2.960 09/24/2021   Lab Results  Component Value Date   CHOL 176 09/24/2021   HDL 30 (L) 09/24/2021   LDLCALC 107 (H) 09/24/2021   TRIG 226 (H) 09/24/2021   CHOLHDL 4.4 03/28/2019   Lab Results  Component Value Date   VD25OH 68.2 09/24/2021   VD25OH 80.5 06/23/2021   VD25OH 38.2 02/24/2021   Lab Results  Component Value Date   WBC 12.4 (H) 06/23/2021   HGB 11.4 06/23/2021   HCT 35.0 06/23/2021   MCV 89 06/23/2021   PLT 336 06/23/2021   No results found for: "IRON", "TIBC", "FERRITIN"  Attestation  Statements:   Reviewed by clinician on day of visit: allergies, medications, problem list, medical history, surgical history, family history, social history, and previous encounter notes.  Time spent on visit including pre-visit chart review and post-visit care and charting was 44 minutes.   I, Trixie Dredge, am acting as transcriptionist for Dennard Nip, MD.  I have reviewed the above documentation for accuracy and completeness, and I agree with the above. -  Dennard Nip, MD

## 2021-11-12 ENCOUNTER — Other Ambulatory Visit (INDEPENDENT_AMBULATORY_CARE_PROVIDER_SITE_OTHER): Payer: Self-pay | Admitting: Family Medicine

## 2021-11-12 DIAGNOSIS — K5909 Other constipation: Secondary | ICD-10-CM

## 2021-11-20 ENCOUNTER — Other Ambulatory Visit: Payer: Self-pay | Admitting: Cardiology

## 2021-11-26 ENCOUNTER — Ambulatory Visit (INDEPENDENT_AMBULATORY_CARE_PROVIDER_SITE_OTHER): Payer: BC Managed Care – PPO | Admitting: Family Medicine

## 2021-11-26 ENCOUNTER — Encounter (INDEPENDENT_AMBULATORY_CARE_PROVIDER_SITE_OTHER): Payer: Self-pay | Admitting: Family Medicine

## 2021-11-26 VITALS — BP 135/81 | HR 101 | Ht 67.0 in | Wt 269.0 lb

## 2021-11-26 DIAGNOSIS — Z7985 Long-term (current) use of injectable non-insulin antidiabetic drugs: Secondary | ICD-10-CM

## 2021-11-26 DIAGNOSIS — E669 Obesity, unspecified: Secondary | ICD-10-CM

## 2021-11-26 DIAGNOSIS — E559 Vitamin D deficiency, unspecified: Secondary | ICD-10-CM

## 2021-11-26 DIAGNOSIS — Z6841 Body Mass Index (BMI) 40.0 and over, adult: Secondary | ICD-10-CM

## 2021-11-26 DIAGNOSIS — E1169 Type 2 diabetes mellitus with other specified complication: Secondary | ICD-10-CM | POA: Diagnosis not present

## 2021-11-26 DIAGNOSIS — R600 Localized edema: Secondary | ICD-10-CM

## 2021-11-26 HISTORY — DX: Localized edema: R60.0

## 2021-11-26 MED ORDER — ERGOCALCIFEROL 1.25 MG (50000 UT) PO CAPS: 50000.0000 [IU] | ORAL_CAPSULE | ORAL | 0 refills | Status: DC

## 2021-11-26 NOTE — Progress Notes (Signed)
Office: [phone]  /  Fax: [phone]   Total lbs lost to date: 49 Total lbs lost since last in-office visit: +1     BP 135/81   Pulse (!) 101   Ht 5\' 7"  (1.702 m)   Wt 269 lb (122 kg)   SpO2 98%   BMI 42.13 kg/m  She was weighed on the bioimpedance scale:  Body mass index is 42.13 kg/m.  General:  Alert, oriented and cooperative. Patient is in no acute distress.  Mental Status: Normal mood and affect. Normal behavior. Normal judgment and thought content.        Patient past medical history includes:   Past Medical History:  Diagnosis Date   Chronic atrial fibrillation, unspecified (HCC) 03/28/2019   Depressed left ventricular ejection fraction 03/28/2019   Essential hypertension    Gastro-esophageal reflux disease without esophagitis    Generalized anxiety disorder    Hypertensive disorder 03/17/2020   Hypothyroidism    Mixed hyperlipidemia    Morbid obesity (HCC) 03/27/2019   Nontoxic multinodular goiter    Occlusion and stenosis of bilateral carotid arteries 03/31/2020   Other vitamin B12 deficiency anemias    Plantar fasciitis 12/17/2019   Polyp at cervical os 03/27/2019   Primary generalized (osteo)arthritis    Pulmonary hypertension (HCC) 03/28/2019   Shortness of breath 04/02/2020   Tightness of heel cord, left 12/17/2019   Type 2 diabetes mellitus with hyperglycemia (HCC)    Unstable angina (HCC)    Vitamin D deficiency     History of Present Illness The patient has been on Ozempic for diabetes management, with their hemoglobin A1c levels improving from 8.8 to 7.0. They have a history of higher A1c levels, reaching up to 11.5 at one point. The patient is satisfied with the effects of Ozempic and has not experienced any significant cravings or difficulty with hunger control. They have been considering a newer medication with better blood sugar control and weight loss side effects but have not yet made a decision.  The patient has been experiencing  swelling in both legs for the past couple of months. The swelling is noticeable, particularly when wearing footies at night. The skin does not feel tight or ooze, and the patient has not mentioned this issue in previous appointments. They have also been experiencing some fluid retention, which they believe may be related to the leg swelling.  The patient has a history of bowel movement irregularity but has recently had two good bowel movements within the past week. They do not believe this is related to their current symptoms. The patient has been advised to increase their potassium intake due to the potential increase in Lasix usage, which can cause potassium loss. They have been consuming yogurt and are considering adding beans to their diet for additional potassium.  The patient is due for labs in December and has been advised to limit their consumption of artificial sweeteners to two cans per day or less. They have been considering using compression socks to help with the leg swelling. The patient is scheduled for a follow-up appointment in four weeks, just before Thanksgiving.  Assessment & Plan Type 2 Diabetes Mellitus: Hemoglobin A1c has improved from 8.8 to 7.0, but still slightly above target. -Continue current management and consider adjusting diet to category 2 plan for better glycemic control.  Obesity: Patient is on Ozempic, which has helped with weight control. -Discussed the possibility of switching to a newer medication with better weight loss side effects, but patient does  not report cravings as a significant issue. -Change to category 2 eating plan  Peripheral Edema: Patient reports bilateral leg swelling for the past couple of months. -Increase Lasix dosage temporarily to manage fluid retention. -Encourage intake of potassium-rich foods to counteract potential hypokalemia from increased Lasix. -Recommend use of compression stockings to help with venous insufficiency and prevent  pooling of blood in the legs.  Vitamin D Deficiency: Patient is currently on supplementation. -Refill prescription for Vitamin D to ensure continuity of therapy.  Follow-up in 4 weeks.     Dennard Nip, MD

## 2021-12-07 DIAGNOSIS — E119 Type 2 diabetes mellitus without complications: Secondary | ICD-10-CM | POA: Diagnosis not present

## 2021-12-21 ENCOUNTER — Encounter (INDEPENDENT_AMBULATORY_CARE_PROVIDER_SITE_OTHER): Payer: Self-pay | Admitting: Family Medicine

## 2021-12-21 ENCOUNTER — Ambulatory Visit (INDEPENDENT_AMBULATORY_CARE_PROVIDER_SITE_OTHER): Payer: BC Managed Care – PPO | Admitting: Family Medicine

## 2021-12-21 VITALS — BP 126/73 | HR 105 | Temp 97.4°F | Ht 67.0 in | Wt 262.0 lb

## 2021-12-21 DIAGNOSIS — Z6841 Body Mass Index (BMI) 40.0 and over, adult: Secondary | ICD-10-CM

## 2021-12-21 DIAGNOSIS — E559 Vitamin D deficiency, unspecified: Secondary | ICD-10-CM

## 2021-12-21 DIAGNOSIS — E669 Obesity, unspecified: Secondary | ICD-10-CM

## 2021-12-21 DIAGNOSIS — E1169 Type 2 diabetes mellitus with other specified complication: Secondary | ICD-10-CM

## 2021-12-21 DIAGNOSIS — Z7985 Long-term (current) use of injectable non-insulin antidiabetic drugs: Secondary | ICD-10-CM

## 2021-12-21 MED ORDER — ERGOCALCIFEROL 1.25 MG (50000 UT) PO CAPS: 50000.0000 [IU] | ORAL_CAPSULE | ORAL | 0 refills | Status: DC

## 2021-12-29 ENCOUNTER — Ambulatory Visit (INDEPENDENT_AMBULATORY_CARE_PROVIDER_SITE_OTHER): Payer: BC Managed Care – PPO | Admitting: Family Medicine

## 2022-01-04 NOTE — Progress Notes (Signed)
Chief Complaint:   OBESITY Suzanne Carey is here to discuss her progress with her obesity treatment plan along with follow-up of her obesity related diagnoses. Suzanne Carey is on keeping a food journal and adhering to recommended goals of 1500 calories and 85+ grams of protein and states she is following her eating plan approximately 80% of the time. Suzanne Carey states she is walking for 10 minutes 3 times per week.  Today's visit was #: 19 Starting weight: 318 lbs Starting date: 07/22/2020 Today's weight: 262 lbs Today's date: 12/21/2021 Total lbs lost to date: 56 Total lbs lost since last in-office visit: 7  Interim History: Suzanne Carey has done better with weight loss on her Category 2 plan. She is walking more often, and she is trying to meal plan better. She is making plans to eat healthier for Thanksgiving.   Subjective:   1. Vitamin D deficiency Suzanne Carey is on Vitamin D, and she requests a refill. She denies nausea, vomiting, or muscle weakness.   2. Type 2 diabetes mellitus with other specified complication, unspecified whether long term insulin use (HCC) Suzanne Carey is on Ozempic 2 mg, and she denies nausea, vomiting, or hypoglycemia.   Assessment/Plan:   1. Vitamin D deficiency We will refill prescription Vitamin D for 1 month. Suzanne Carey will follow-up for routine testing of Vitamin D, at least 2-3 times per year to avoid over-replacement.  - ergocalciferol (VITAMIN D2) 1.25 MG (50000 UT) capsule; Take 1 capsule (50,000 Units total) by mouth once a week.  Dispense: 4 capsule; Refill: 0  2. Type 2 diabetes mellitus with other specified complication, unspecified whether long term insulin use (HCC) Suzanne Carey will continue Ozempic at 2 mg, and we will refill as needed.   3. Obesity, Current BMI 41.2 Suzanne Carey is currently in the action stage of change. As such, her goal is to continue with weight loss efforts. She has agreed to the Category 2 Plan.   We will recheck fasting labs at her next visit.   Exercise  goals: As is.   Behavioral modification strategies: holiday eating strategies .  Suzanne Carey has agreed to follow-up with our clinic in 4 weeks. She was informed of the importance of frequent follow-up visits to maximize her success with intensive lifestyle modifications for her multiple health conditions.   Objective:   Blood pressure 126/73, pulse (!) 105, temperature (!) 97.4 F (36.3 C), height 5\' 7"  (1.702 m), weight 262 lb (118.8 kg), SpO2 98 %. Body mass index is 41.04 kg/m.  General: Cooperative, alert, well developed, in no acute distress. HEENT: Conjunctivae and lids unremarkable. Cardiovascular: Regular rhythm.  Lungs: Normal work of breathing. Neurologic: No focal deficits.   Lab Results  Component Value Date   CREATININE 1.29 (H) 09/24/2021   BUN 21 09/24/2021   NA 138 09/24/2021   K 3.9 09/24/2021   CL 98 09/24/2021   CO2 24 09/24/2021   Lab Results  Component Value Date   ALT 11 09/24/2021   AST 12 09/24/2021   ALKPHOS 67 09/24/2021   BILITOT <0.2 09/24/2021   Lab Results  Component Value Date   HGBA1C 7.0 (H) 09/24/2021   HGBA1C 7.2 (H) 06/23/2021   HGBA1C 7.1 (H) 02/24/2021   HGBA1C 8.8 (H) 10/27/2020   HGBA1C 8.3 (H) 07/22/2020   Lab Results  Component Value Date   INSULIN 29.5 (H) 09/24/2021   INSULIN 18.2 06/23/2021   INSULIN 11.1 02/24/2021   INSULIN 15.7 10/27/2020   INSULIN 26.4 (H) 07/22/2020   Lab Results  Component Value Date   TSH 2.960 09/24/2021   Lab Results  Component Value Date   CHOL 176 09/24/2021   HDL 30 (L) 09/24/2021   LDLCALC 107 (H) 09/24/2021   TRIG 226 (H) 09/24/2021   CHOLHDL 4.4 03/28/2019   Lab Results  Component Value Date   VD25OH 68.2 09/24/2021   VD25OH 80.5 06/23/2021   VD25OH 38.2 02/24/2021   Lab Results  Component Value Date   WBC 12.4 (H) 06/23/2021   HGB 11.4 06/23/2021   HCT 35.0 06/23/2021   MCV 89 06/23/2021   PLT 336 06/23/2021   No results found for: "IRON", "TIBC",  "FERRITIN"  Attestation Statements:   Reviewed by clinician on day of visit: allergies, medications, problem list, medical history, surgical history, family history, social history, and previous encounter notes.   I, Burt Knack, am acting as transcriptionist for Quillian Quince, MD.  I have reviewed the above documentation for accuracy and completeness, and I agree with the above. -  Quillian Quince, MD

## 2022-01-06 DIAGNOSIS — E1165 Type 2 diabetes mellitus with hyperglycemia: Secondary | ICD-10-CM | POA: Diagnosis not present

## 2022-01-06 DIAGNOSIS — E782 Mixed hyperlipidemia: Secondary | ICD-10-CM | POA: Diagnosis not present

## 2022-01-06 DIAGNOSIS — I10 Essential (primary) hypertension: Secondary | ICD-10-CM | POA: Diagnosis not present

## 2022-01-06 DIAGNOSIS — K219 Gastro-esophageal reflux disease without esophagitis: Secondary | ICD-10-CM | POA: Diagnosis not present

## 2022-01-20 ENCOUNTER — Ambulatory Visit (INDEPENDENT_AMBULATORY_CARE_PROVIDER_SITE_OTHER): Payer: BC Managed Care – PPO | Admitting: Family Medicine

## 2022-01-20 ENCOUNTER — Encounter (INDEPENDENT_AMBULATORY_CARE_PROVIDER_SITE_OTHER): Payer: Self-pay | Admitting: Family Medicine

## 2022-01-20 VITALS — BP 123/78 | HR 59 | Temp 97.5°F | Ht 67.0 in | Wt 260.0 lb

## 2022-01-20 DIAGNOSIS — E559 Vitamin D deficiency, unspecified: Secondary | ICD-10-CM | POA: Diagnosis not present

## 2022-01-20 DIAGNOSIS — E669 Obesity, unspecified: Secondary | ICD-10-CM

## 2022-01-20 DIAGNOSIS — D518 Other vitamin B12 deficiency anemias: Secondary | ICD-10-CM | POA: Diagnosis not present

## 2022-01-20 DIAGNOSIS — E782 Mixed hyperlipidemia: Secondary | ICD-10-CM

## 2022-01-20 DIAGNOSIS — E1169 Type 2 diabetes mellitus with other specified complication: Secondary | ICD-10-CM

## 2022-01-20 DIAGNOSIS — I5032 Chronic diastolic (congestive) heart failure: Secondary | ICD-10-CM | POA: Diagnosis not present

## 2022-01-20 DIAGNOSIS — Z6841 Body Mass Index (BMI) 40.0 and over, adult: Secondary | ICD-10-CM

## 2022-01-20 DIAGNOSIS — Z7985 Long-term (current) use of injectable non-insulin antidiabetic drugs: Secondary | ICD-10-CM

## 2022-01-21 LAB — LIPID PANEL WITH LDL/HDL RATIO
Cholesterol, Total: 193 mg/dL (ref 100–199)
HDL: 30 mg/dL — ABNORMAL LOW (ref >39)
LDL Chol Calc (NIH): 126 mg/dL — ABNORMAL HIGH (ref 0–99)
LDL/HDL Ratio: 4.2 ratio — ABNORMAL HIGH (ref 0.0–3.2)
Triglycerides: 208 mg/dL — ABNORMAL HIGH (ref 0–149)
VLDL Cholesterol Cal: 37 mg/dL (ref 5–40)

## 2022-01-21 LAB — CBC WITH DIFFERENTIAL/PLATELET
Basophils Absolute: 0 10*3/uL (ref 0.0–0.2)
Basos: 0 %
EOS (ABSOLUTE): 0.1 10*3/uL (ref 0.0–0.4)
Eos: 1 %
Hematocrit: 35.6 % (ref 34.0–46.6)
Hemoglobin: 11.5 g/dL (ref 11.1–15.9)
Immature Grans (Abs): 0.1 10*3/uL (ref 0.0–0.1)
Immature Granulocytes: 1 %
Lymphocytes Absolute: 1.8 10*3/uL (ref 0.7–3.1)
Lymphs: 14 %
MCH: 27.8 pg (ref 26.6–33.0)
MCHC: 32.3 g/dL (ref 31.5–35.7)
MCV: 86 fL (ref 79–97)
Monocytes Absolute: 0.9 10*3/uL (ref 0.1–0.9)
Monocytes: 7 %
Neutrophils Absolute: 10.4 10*3/uL — ABNORMAL HIGH (ref 1.4–7.0)
Neutrophils: 77 %
Platelets: 339 10*3/uL (ref 150–450)
RBC: 4.14 x10E6/uL (ref 3.77–5.28)
RDW: 13.3 % (ref 11.7–15.4)
WBC: 13.4 10*3/uL — ABNORMAL HIGH (ref 3.4–10.8)

## 2022-01-21 LAB — CMP14+EGFR
ALT: 7 IU/L (ref 0–32)
AST: 12 IU/L (ref 0–40)
Albumin/Globulin Ratio: 1.3 (ref 1.2–2.2)
Albumin: 4 g/dL (ref 3.9–4.9)
Alkaline Phosphatase: 72 IU/L (ref 44–121)
BUN/Creatinine Ratio: 15 (ref 12–28)
BUN: 20 mg/dL (ref 8–27)
Bilirubin Total: 0.4 mg/dL (ref 0.0–1.2)
CO2: 24 mmol/L (ref 20–29)
Calcium: 9.6 mg/dL (ref 8.7–10.3)
Chloride: 95 mmol/L — ABNORMAL LOW (ref 96–106)
Creatinine, Ser: 1.31 mg/dL — ABNORMAL HIGH (ref 0.57–1.00)
Globulin, Total: 3.2 g/dL (ref 1.5–4.5)
Glucose: 140 mg/dL — ABNORMAL HIGH (ref 70–99)
Potassium: 3.5 mmol/L (ref 3.5–5.2)
Sodium: 137 mmol/L (ref 134–144)
Total Protein: 7.2 g/dL (ref 6.0–8.5)
eGFR: 46 mL/min/{1.73_m2} — ABNORMAL LOW (ref >59)

## 2022-01-21 LAB — DIGOXIN LEVEL: Digoxin, Serum: 0.8 ng/mL (ref 0.5–0.9)

## 2022-01-21 LAB — VITAMIN B12: Vitamin B-12: 337 pg/mL (ref 232–1245)

## 2022-01-21 LAB — VITAMIN D 25 HYDROXY (VIT D DEFICIENCY, FRACTURES): Vit D, 25-Hydroxy: 65.8 ng/mL (ref 30.0–100.0)

## 2022-01-21 LAB — INSULIN, RANDOM: INSULIN: 16.9 u[IU]/mL (ref 2.6–24.9)

## 2022-01-21 LAB — HEMOGLOBIN A1C
Est. average glucose Bld gHb Est-mCnc: 177 mg/dL
Hgb A1c MFr Bld: 7.8 % — ABNORMAL HIGH (ref 4.8–5.6)

## 2022-02-03 NOTE — Progress Notes (Signed)
Chief Complaint:   OBESITY Suzanne Carey is here to discuss her progress with her obesity treatment plan along with follow-up of her obesity related diagnoses. Suzanne Carey is on the Category 2 Plan and states she is following her eating plan approximately 80% of the time. Suzanne Carey states she is walking for 10-15 minutes 7 times per week.  Today's visit was #: 20 Starting weight: 318 lbs Starting date: 07/22/2020 Today's weight: 260 lbs Today's date: 01/20/2022 Total lbs lost to date: 58 Total lbs lost since last in-office visit: 2  Interim History: Suzanne Carey continues to do very well with her weight loss even over Thanksgiving. Her hunger is controlled and she is working on decreasing snacking and decreasing sugar.   Subjective:   1. Type 2 diabetes mellitus with other specified complication, unspecified whether long term insulin use (HCC) Suzanne Carey is working on her diet and she is doing well with decreasing simple carbohydrates.  2. Vitamin D deficiency Suzanne Carey is on vitamin D, and she denies nausea, vomiting, or muscle weakness.  3. Mixed hyperlipidemia Suzanne Carey is on a low-cholesterol diet, and she is due for labs.  4. Other vitamin B12 deficiency anemias Suzanne Carey is working on increasing her B12 in her diet.  She is due to have labs.  5. Chronic diastolic heart failure ([place]) Suzanne Carey is on digoxin and she asks if I can check her level for her PCP.  Assessment/Plan:   1. Type 2 diabetes mellitus with other specified complication, unspecified whether long term insulin use (HCC) We will check labs today, and we will follow-up at [facility] next visit.  - CBC with Differential/Platelet - CMP14+EGFR - Insulin, random - Hemoglobin A1c - Microalbumin / creatinine urine ratio  2. Vitamin D deficiency We will check labs today. Shiryl will follow-up for routine testing of Vitamin D, at least 2-3 times per year to avoid over-replacement.  - VITAMIN D 25 Hydroxy (Vit-D Deficiency, Fractures)  3. Mixed  hyperlipidemia We will check labs today. Amiri will continue to work on diet, exercise and weight loss efforts. Orders and follow up as documented in patient record.   - Lipid Panel With LDL/HDL Ratio  4. Other vitamin B12 deficiency anemias We will check labs today. The diagnosis was reviewed with the patient. We will continue to monitor. Orders and follow up as documented in patient record.  - Vitamin B12  5. Chronic diastolic heart failure (HCC) We will check labs today, and we will follow-up at [facility] next visit.   - Digoxin level  6. Obesity, Current BMI 40.8 Suzanne Carey is currently in the action stage of change. As such, her goal is to continue with weight loss efforts. She has agreed to the Category 2 Plan.   Exercise goals: As is.   Behavioral modification strategies: increasing lean protein intake.  Suzanne Carey has agreed to follow-up with our clinic in 3 to 4 weeks. She was informed of the importance of frequent follow-up visits to maximize her success with intensive lifestyle modifications for her multiple health conditions.   Objective:   Blood pressure 123/78, pulse (!) 59, temperature (!) 97.5 F (36.4 C), height _0  (1.702 m), weight 260 lb (117.9 kg), SpO2 98 %. Body mass index is 40.72 kg/m.  General: Cooperative, alert, well developed, in no acute distress. HEENT: Conjunctivae and lids unremarkable. Cardiovascular: Regular rhythm.  Lungs: Normal work of breathing. Neurologic: No focal deficits.   Lab Results  Component Value Date   CREATININE 1.31 (H) 01/20/2022   BUN 20 01/20/2022  NA 137 01/20/2022   K 3.5 01/20/2022   CL 95 (L) 01/20/2022   CO2 24 01/20/2022   Lab Results  Component Value Date   ALT 7 01/20/2022   AST 12 01/20/2022   ALKPHOS 72 01/20/2022   BILITOT 0.4 01/20/2022   Lab Results  Component Value Date   HGBA1C 7.8 (H) 01/20/2022   HGBA1C 7.0 (H) 09/24/2021   HGBA1C 7.2 (H) 06/23/2021   HGBA1C 7.1 (H) 02/24/2021   HGBA1C 8.8 (H)  10/27/2020   Lab Results  Component Value Date   INSULIN 16.9 01/20/2022   INSULIN 29.5 (H) 09/24/2021   INSULIN 18.2 06/23/2021   INSULIN 11.1 02/24/2021   INSULIN 15.7 10/27/2020   Lab Results  Component Value Date   TSH 2.960 09/24/2021   Lab Results  Component Value Date   CHOL 193 01/20/2022   HDL 30 (L) 01/20/2022   LDLCALC 126 (H) 01/20/2022   TRIG 208 (H) 01/20/2022   CHOLHDL 4.4 03/28/2019   Lab Results  Component Value Date   VD25OH 65.8 01/20/2022   VD25OH 68.2 09/24/2021   VD25OH 80.5 06/23/2021   Lab Results  Component Value Date   WBC 13.4 (H) 01/20/2022   HGB 11.5 01/20/2022   HCT 35.6 01/20/2022   MCV 86 01/20/2022   PLT 339 01/20/2022   No results found for: "IRON", "TIBC", "FERRITIN"  Attestation Statements:   Reviewed by clinician on day of visit: allergies, medications, problem list, medical history, surgical history, family history, social history, and previous encounter notes.   I, Trixie Dredge, am acting as transcriptionist for Dennard Nip, MD.  I have reviewed the above documentation for accuracy and completeness, and I agree with the above. -  Dennard Nip, MD

## 2022-02-09 ENCOUNTER — Other Ambulatory Visit: Payer: Self-pay | Admitting: Obstetrics and Gynecology

## 2022-02-09 DIAGNOSIS — Z1231 Encounter for screening mammogram for malignant neoplasm of breast: Secondary | ICD-10-CM

## 2022-02-17 IMAGING — MG MM DIGITAL SCREENING BILAT W/ TOMO AND CAD
8 of 17 series · 8 of 40 positions shown · non-contrast
Comparison: Previous exam(s).

CLINICAL DATA: Screening.

EXAM:
DIGITAL SCREENING BILATERAL MAMMOGRAM WITH TOMOSYNTHESIS AND CAD
TECHNIQUE: Bilateral screening digital craniocaudal and mediolateral oblique
mammograms were obtained. Bilateral screening digital breast
tomosynthesis was performed. The images were evaluated with
computer-aided detection.

[R MLO synth-2D (1 of 2)]
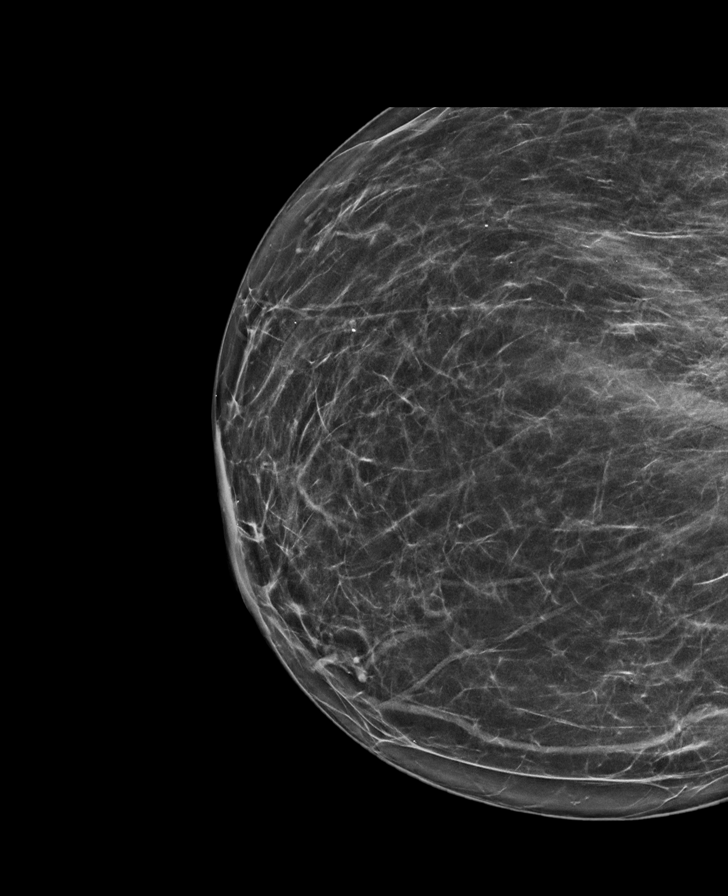

[L CC synth-2D (1 of 2)]
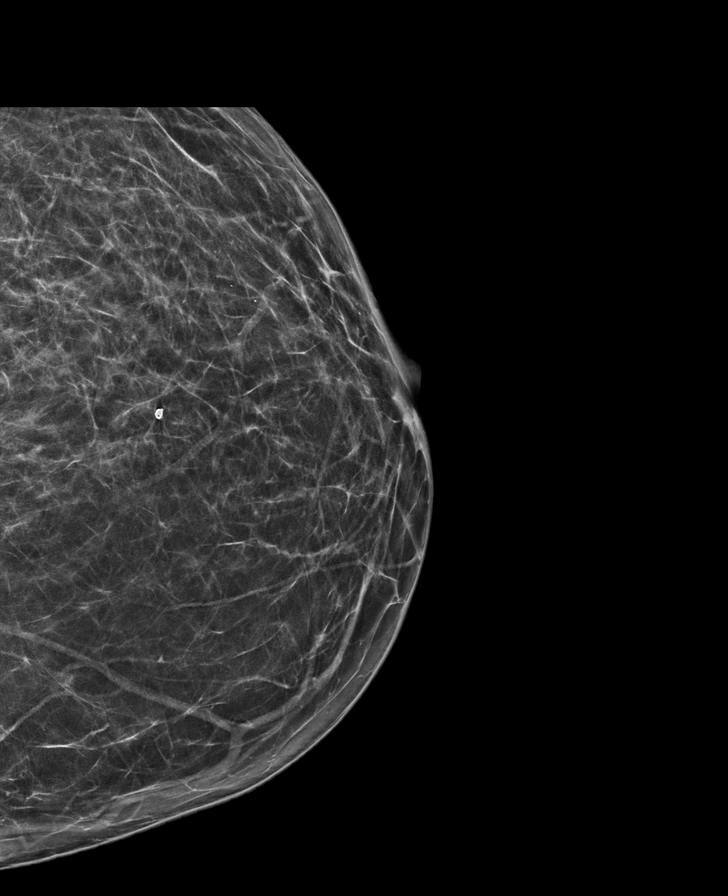

[R MLO synth-2D (2 of 2)]
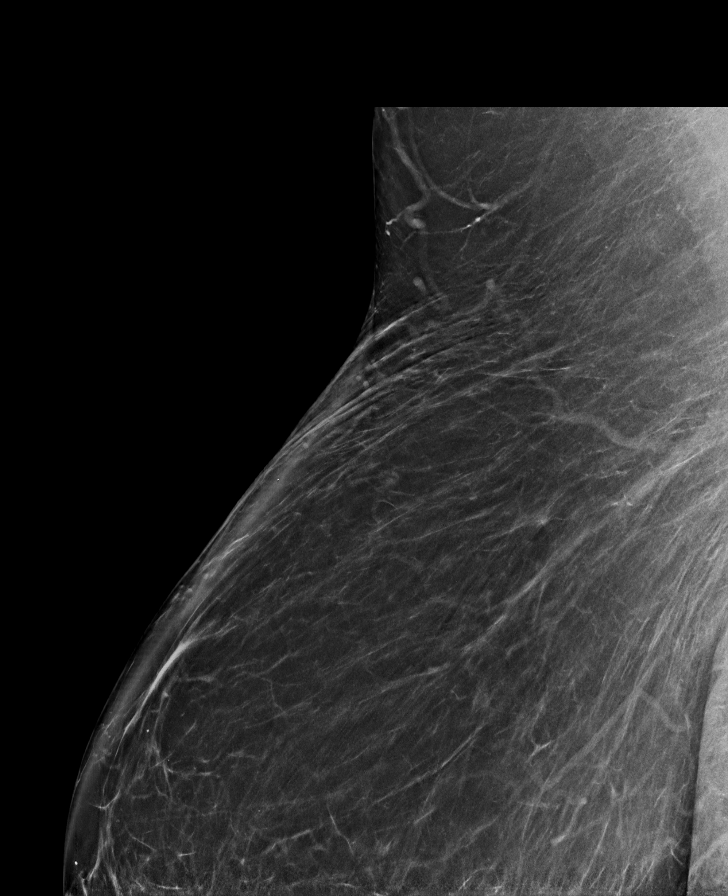

[L MLO synth-2D]
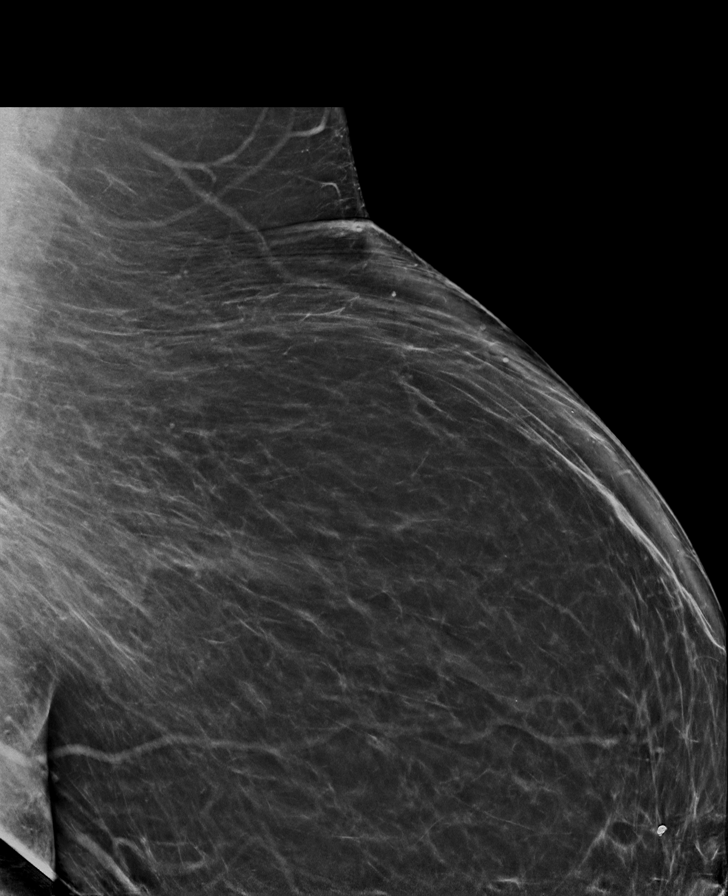

[L CC synth-2D (2 of 2)]
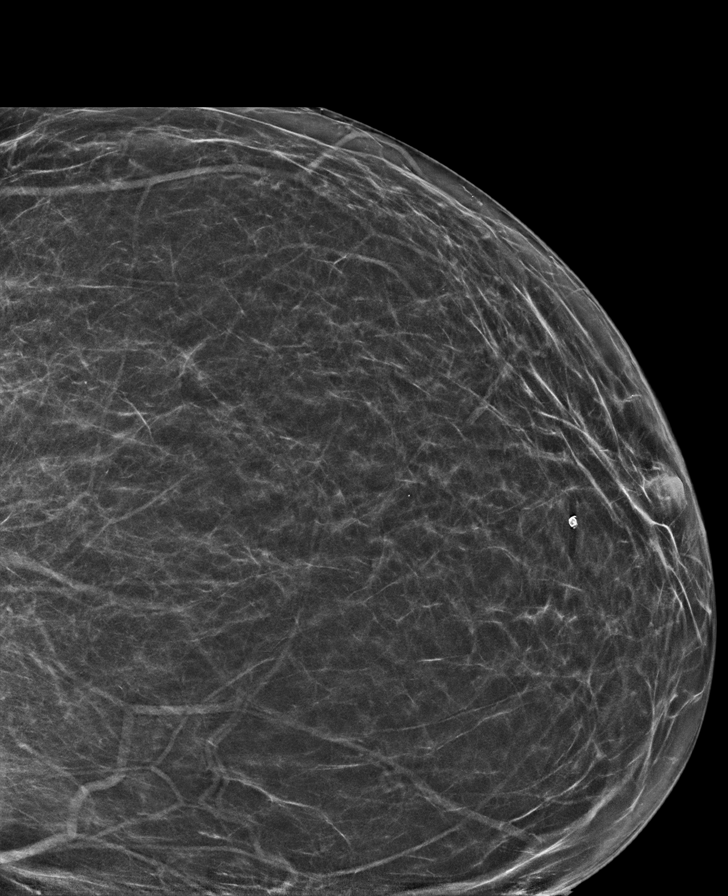

[R CC synth-2D (1 of 2)]
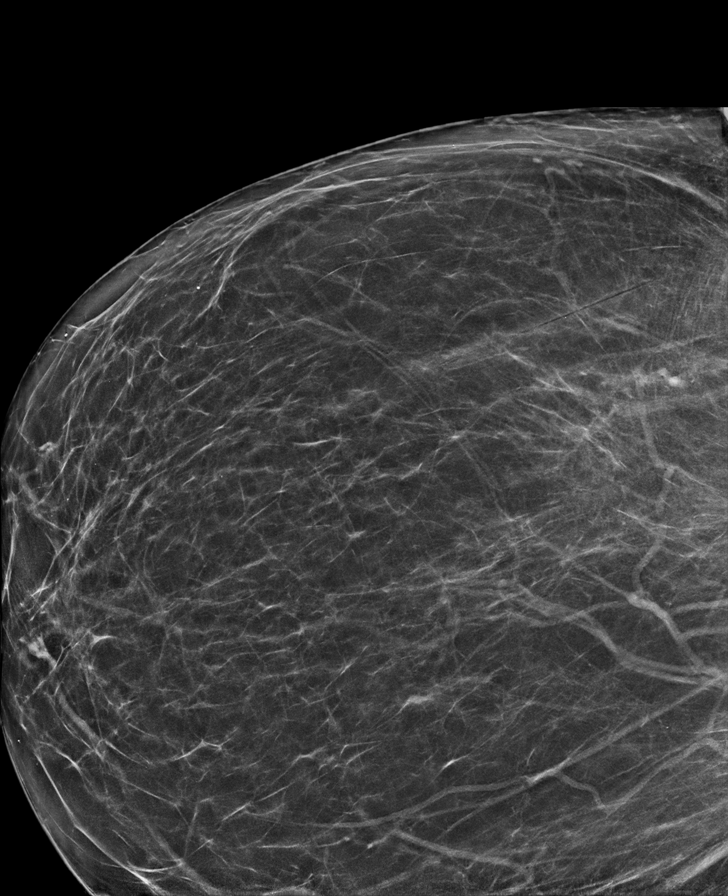

[R CC synth-2D (2 of 2)]
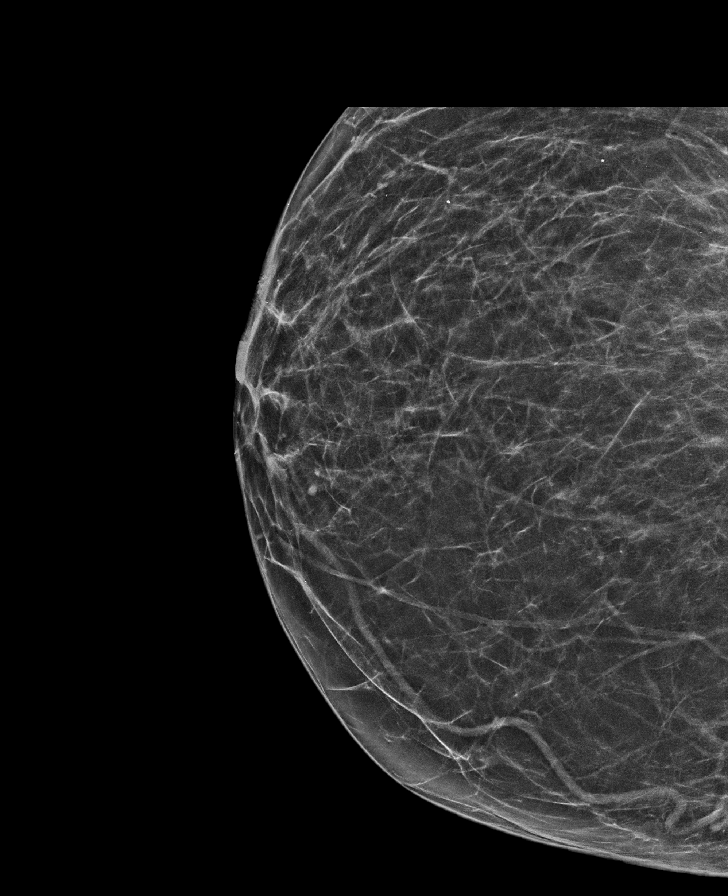

[R CV tomo · tomo slice 40/79.0]
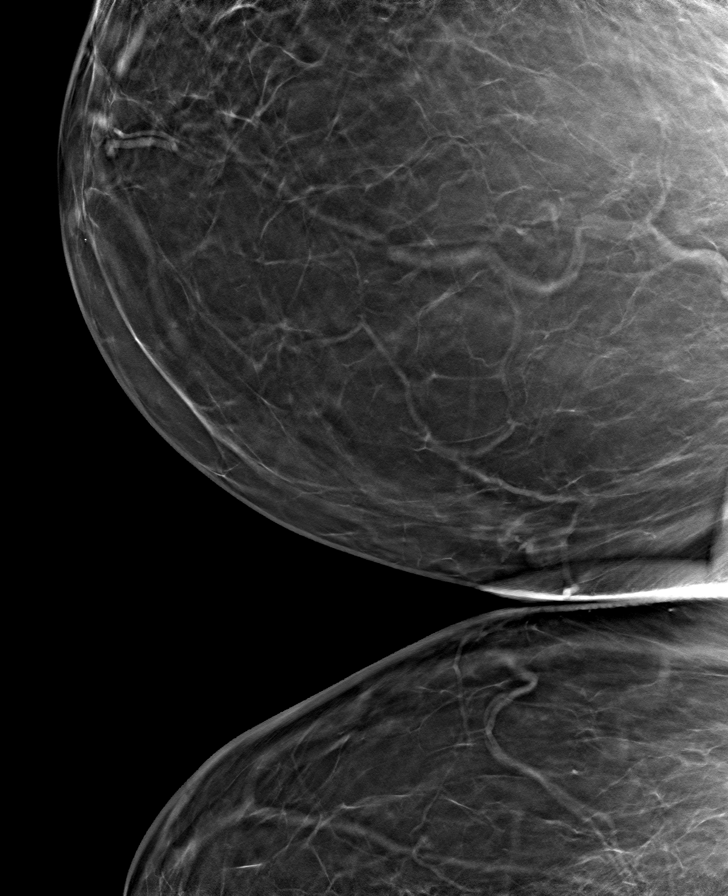

[8 of 40 positions shown; findings below may reference images not displayed]

ACR Breast Density Category b: There are scattered areas of
fibroglandular density.
FINDINGS: There are no findings suspicious for malignancy. The images were
evaluated with computer-aided detection.
IMPRESSION: No mammographic evidence of malignancy. A result letter of this
screening mammogram will be mailed directly to the patient.

RECOMMENDATION:
Screening mammogram in one year. (Code:WJ-I-BG6)

BI-RADS CATEGORY  1: Negative.

## 2022-02-18 ENCOUNTER — Ambulatory Visit (INDEPENDENT_AMBULATORY_CARE_PROVIDER_SITE_OTHER): Payer: BC Managed Care – PPO | Admitting: Physician Assistant

## 2022-02-18 ENCOUNTER — Encounter (INDEPENDENT_AMBULATORY_CARE_PROVIDER_SITE_OTHER): Payer: Self-pay | Admitting: Physician Assistant

## 2022-02-18 VITALS — BP 122/59 | HR 93 | Temp 98.3°F | Ht 67.0 in | Wt 259.0 lb

## 2022-02-18 DIAGNOSIS — E1169 Type 2 diabetes mellitus with other specified complication: Secondary | ICD-10-CM

## 2022-02-18 DIAGNOSIS — D72825 Bandemia: Secondary | ICD-10-CM | POA: Diagnosis not present

## 2022-02-18 DIAGNOSIS — E782 Mixed hyperlipidemia: Secondary | ICD-10-CM

## 2022-02-18 DIAGNOSIS — E559 Vitamin D deficiency, unspecified: Secondary | ICD-10-CM | POA: Diagnosis not present

## 2022-02-18 DIAGNOSIS — Z7985 Long-term (current) use of injectable non-insulin antidiabetic drugs: Secondary | ICD-10-CM

## 2022-02-18 DIAGNOSIS — R600 Localized edema: Secondary | ICD-10-CM | POA: Diagnosis not present

## 2022-02-18 DIAGNOSIS — E669 Obesity, unspecified: Secondary | ICD-10-CM

## 2022-02-18 DIAGNOSIS — Z6841 Body Mass Index (BMI) 40.0 and over, adult: Secondary | ICD-10-CM

## 2022-02-24 NOTE — Progress Notes (Signed)
Chief Complaint:   OBESITY Suzanne Carey is here to discuss her progress with her obesity treatment plan along with follow-up of her obesity related diagnoses. Suzanne Carey is on the Category 2 Plan and states she is following her eating plan approximately 75% of the time. Suzanne Carey states she is walking 15 minutes 3-4 times per week.  Today's visit was #: 21 Starting weight: 318 lbs Starting date: 07/22/2020 Today's weight: 259 lbs Today's date: 02/18/2022 Total lbs lost to date: 59 lbs Total lbs lost since last in-office visit: 1  Interim History: Suzanne Carey has done well with weight loss. She does report some increased indulgences over the holidays simple carbs like breads and sweets, but she was also mindful to get her protein in first to help decrease her hunger.  She feels her hunger is controlled when she is eating adequate protein.  She is getting back on track with her plan following the holidays. She is on Ozempic 2 mg weekly with no side effects.  Subjective:   1. Type 2 diabetes mellitus with other specified complication, unspecified whether long term insulin use ([place]) Labs discussed during visit today.  On Ozempic 2 mg weekly--Denies any side effects.  Actos 30 mg daily-Denies any side effects.  A1c slight worse -7.8 but reports increase in simple carbs and also had a UTI over Thanksgiving.  Getting back on track with her nutrition plan to decrease simple carbohydrates, increase lean protein and exercise to promote weight loss and improve glycemic control  2. Vitamin D deficiency Labs discussed during visit today.  Level of 65.8.  Taking ergocalciferol weekly.  Denies any side effects.  At goal.  3. Lower extremity edema Leg edema improved with increased Lasix to twice a day and does this about 1-2 times monthly.  Renal function is improved/stable.  4. Bandemia WBC of 13.4.  Had UTI at Thanksgiving.  Treated with antibiotics and symptoms resolved.  No symptoms of UTI today.  5. Mixed  hyperlipidemia Labs discussed during visit today.  HDL of 30/Trig of 208-increased/LDL of 126 increased.  Reports Lipitor caused severe swelling in past.  Not on medications.  Working on decreasing simple carbs, decreasing sat fats, decreasing cholesterol, increasing lean protein.   Assessment/Plan:   1. Type 2 diabetes mellitus with other specified complication, unspecified whether long term insulin use (HCC) Continue Ozempic and add dose..  Recheck labs in 2-3 months.  Continue Prescribed Nutrition Plan and exercise to promote weight loss and improve glycemic control.  2. Vitamin D deficiency Continue ergocalciferol weekly.  Will follow up vitamin D level in 2-3 months to avoid oversupplementation  3. Lower extremity edema Continue to monitor closely on increased Lasix 1-2 times monthly.  Follow up with cardiology as scheduled in April 2024.  Recheck labs in 2 to 3 months  4. Bandemia Follow up with WBC in 2-3 months--monitor for recurrent symptoms of UTI.  5. Mixed hyperlipidemia Continue Prescribed Nutrition Plan and exercise.  6. Obesity, Current BMI 40.6 Barbar is currently in the action stage of change. As such, her goal is to continue with weight loss efforts. She has agreed to the Category 2 Plan.   Exercise goals: As is.  Behavioral modification strategies: increasing lean protein intake, decreasing simple carbohydrates, and better snacking choices.  Suzanne Carey has agreed to follow-up with our clinic in 4 weeks. She was informed of the importance of frequent follow-up visits to maximize her success with intensive lifestyle modifications for her multiple health conditions.   Objective:  Blood pressure (!) 122/59, pulse 93, temperature 98.3 F (36.8 C), height 5\' 7"  (1.702 m), weight 259 lb (117.5 kg), SpO2 99 %. Body mass index is 40.57 kg/m.  General: Cooperative, alert, well developed, in no acute distress. HEENT: Conjunctivae and lids unremarkable. Cardiovascular: Regular  rhythm.  Lungs: Normal work of breathing. Neurologic: No focal deficits.   Lab Results  Component Value Date   CREATININE 1.31 (H) 01/20/2022   BUN 20 01/20/2022   NA 137 01/20/2022   K 3.5 01/20/2022   CL 95 (L) 01/20/2022   CO2 24 01/20/2022   Lab Results  Component Value Date   ALT 7 01/20/2022   AST 12 01/20/2022   ALKPHOS 72 01/20/2022   BILITOT 0.4 01/20/2022   Lab Results  Component Value Date   HGBA1C 7.8 (H) 01/20/2022   HGBA1C 7.0 (H) 09/24/2021   HGBA1C 7.2 (H) 06/23/2021   HGBA1C 7.1 (H) 02/24/2021   HGBA1C 8.8 (H) 10/27/2020   Lab Results  Component Value Date   INSULIN 16.9 01/20/2022   INSULIN 29.5 (H) 09/24/2021   INSULIN 18.2 06/23/2021   INSULIN 11.1 02/24/2021   INSULIN 15.7 10/27/2020   Lab Results  Component Value Date   TSH 2.960 09/24/2021   Lab Results  Component Value Date   CHOL 193 01/20/2022   HDL 30 (L) 01/20/2022   LDLCALC 126 (H) 01/20/2022   TRIG 208 (H) 01/20/2022   CHOLHDL 4.4 03/28/2019   Lab Results  Component Value Date   VD25OH 65.8 01/20/2022   VD25OH 68.2 09/24/2021   VD25OH 80.5 06/23/2021   Lab Results  Component Value Date   WBC 13.4 (H) 01/20/2022   HGB 11.5 01/20/2022   HCT 35.6 01/20/2022   MCV 86 01/20/2022   PLT 339 01/20/2022   No results found for: "IRON", "TIBC", "FERRITIN"  Attestation Statements:   Reviewed by clinician on day of visit: allergies, medications, problem list, medical history, surgical history, family history, social history, and previous encounter notes.  I, Brendell Tyus, am acting as transcriptionist for [employer], PA.  I have reviewed the above documentation for accuracy and completeness, and I agree with the above. -  Kacen Mellinger,PA-C

## 2022-02-25 ENCOUNTER — Other Ambulatory Visit (INDEPENDENT_AMBULATORY_CARE_PROVIDER_SITE_OTHER): Payer: Self-pay | Admitting: Family Medicine

## 2022-02-25 DIAGNOSIS — E559 Vitamin D deficiency, unspecified: Secondary | ICD-10-CM

## 2022-03-23 DIAGNOSIS — Z6841 Body Mass Index (BMI) 40.0 and over, adult: Secondary | ICD-10-CM | POA: Diagnosis not present

## 2022-03-23 DIAGNOSIS — Z01419 Encounter for gynecological examination (general) (routine) without abnormal findings: Secondary | ICD-10-CM | POA: Diagnosis not present

## 2022-03-25 ENCOUNTER — Ambulatory Visit (INDEPENDENT_AMBULATORY_CARE_PROVIDER_SITE_OTHER): Payer: BC Managed Care – PPO | Admitting: Family Medicine

## 2022-03-25 ENCOUNTER — Encounter (INDEPENDENT_AMBULATORY_CARE_PROVIDER_SITE_OTHER): Payer: Self-pay | Admitting: Family Medicine

## 2022-03-25 VITALS — BP 111/68 | HR 80 | Temp 97.3°F | Ht 67.0 in | Wt 262.0 lb

## 2022-03-25 DIAGNOSIS — E669 Obesity, unspecified: Secondary | ICD-10-CM

## 2022-03-25 DIAGNOSIS — Z6841 Body Mass Index (BMI) 40.0 and over, adult: Secondary | ICD-10-CM

## 2022-03-25 DIAGNOSIS — E559 Vitamin D deficiency, unspecified: Secondary | ICD-10-CM

## 2022-03-25 DIAGNOSIS — E1169 Type 2 diabetes mellitus with other specified complication: Secondary | ICD-10-CM

## 2022-03-25 DIAGNOSIS — K219 Gastro-esophageal reflux disease without esophagitis: Secondary | ICD-10-CM

## 2022-03-25 DIAGNOSIS — Z7984 Long term (current) use of oral hypoglycemic drugs: Secondary | ICD-10-CM

## 2022-03-25 DIAGNOSIS — Z7985 Long-term (current) use of injectable non-insulin antidiabetic drugs: Secondary | ICD-10-CM

## 2022-03-25 HISTORY — DX: Gastro-esophageal reflux disease without esophagitis: K21.9

## 2022-03-25 HISTORY — DX: Body Mass Index (BMI) 40.0 and over, adult: Z684

## 2022-03-25 MED ORDER — PANTOPRAZOLE SODIUM 40 MG PO TBEC: 40.0000 mg | DELAYED_RELEASE_TABLET | Freq: Every day | ORAL | 0 refills | Status: DC

## 2022-03-25 MED ORDER — ERGOCALCIFEROL 1.25 MG (50000 UT) PO CAPS: 50000.0000 [IU] | ORAL_CAPSULE | ORAL | 0 refills | Status: DC

## 2022-03-25 MED ORDER — SEMAGLUTIDE (2 MG/DOSE) 8 MG/3ML ~~LOC~~ SOPN: 2.0000 mg | PEN_INJECTOR | SUBCUTANEOUS | 0 refills | Status: DC

## 2022-04-07 DIAGNOSIS — Z79899 Other long term (current) drug therapy: Secondary | ICD-10-CM | POA: Diagnosis not present

## 2022-04-07 DIAGNOSIS — E1165 Type 2 diabetes mellitus with hyperglycemia: Secondary | ICD-10-CM | POA: Diagnosis not present

## 2022-04-07 DIAGNOSIS — D518 Other vitamin B12 deficiency anemias: Secondary | ICD-10-CM | POA: Diagnosis not present

## 2022-04-07 DIAGNOSIS — M79671 Pain in right foot: Secondary | ICD-10-CM | POA: Diagnosis not present

## 2022-04-07 DIAGNOSIS — E782 Mixed hyperlipidemia: Secondary | ICD-10-CM | POA: Diagnosis not present

## 2022-04-07 DIAGNOSIS — K219 Gastro-esophageal reflux disease without esophagitis: Secondary | ICD-10-CM | POA: Diagnosis not present

## 2022-04-07 DIAGNOSIS — Z0000 Encounter for general adult medical examination without abnormal findings: Secondary | ICD-10-CM | POA: Diagnosis not present

## 2022-04-07 DIAGNOSIS — I10 Essential (primary) hypertension: Secondary | ICD-10-CM | POA: Diagnosis not present

## 2022-04-07 DIAGNOSIS — E038 Other specified hypothyroidism: Secondary | ICD-10-CM | POA: Diagnosis not present

## 2022-04-08 ENCOUNTER — Other Ambulatory Visit: Payer: Self-pay | Admitting: Hematology and Oncology

## 2022-04-08 DIAGNOSIS — D72829 Elevated white blood cell count, unspecified: Secondary | ICD-10-CM

## 2022-04-09 ENCOUNTER — Encounter: Payer: Self-pay | Admitting: Hematology and Oncology

## 2022-04-09 ENCOUNTER — Inpatient Hospital Stay: Payer: BC Managed Care – PPO

## 2022-04-09 ENCOUNTER — Inpatient Hospital Stay: Payer: BC Managed Care – PPO | Attending: Hematology and Oncology | Admitting: Hematology and Oncology

## 2022-04-09 VITALS — BP 155/79 | HR 80 | Temp 98.1°F | Resp 16 | Ht 67.0 in | Wt 268.0 lb

## 2022-04-09 DIAGNOSIS — D72829 Elevated white blood cell count, unspecified: Secondary | ICD-10-CM

## 2022-04-09 DIAGNOSIS — E1169 Type 2 diabetes mellitus with other specified complication: Secondary | ICD-10-CM | POA: Diagnosis not present

## 2022-04-09 LAB — CBC AND DIFFERENTIAL
BASO%: 0 %
EOS%: 0 %
HCT: 35 — AB (ref 36–46)
Hemoglobin: 11.7 — AB (ref 12.0–16.0)
LYMPH%: 16 %
MONO%: 7 %
NEUT%: 76 %
Neutrophils Absolute: 7.98
Platelets: 295 10*3/uL (ref 150–400)
WBC: 10.5

## 2022-04-09 LAB — CBC: RBC: 4.02 (ref 3.87–5.11)

## 2022-04-09 NOTE — Progress Notes (Cosign Needed)
[facility] [address] [place],  [place]  [address] [phone]  Clinic Day:  04/09/2022  Referring physician: Bonnita Nasuti, MD   REASON FOR CONSULTATION:  Leukocytosis  HISTORY OF PRESENT ILLNESS:  Suzanne Carey is a 62 y.o. female with a history of leukocytosis who is referred in consultation by Dr. Celedonio Miyamoto for assessment.  The patient states she has been seeing Dr. Leafy Ro in the healthy weight and wellness clinic at home.  She states Dr. Leafy Ro advised her that her white blood cell count has been high and recommended she discuss with her primary care.  CBC on December 13th at Dr. Migdalia Dk office revealed a total white blood cell count 13.4 with 77% neutrophils, 14% lymphocytes 7% monocytes, 1% eosinophils and 1% immature granulocytes on automated differential.  Hemoglobin was 11.5 with an MCV of86.  Platelets 339,000.  Her white blood cell count has been running between 12.4 and 13.7 since June 2022 with a similar automated differential.  Of note, repeat CBC at Dr. Tenna Delaine office on February 28th revealed a white blood cell count of 10.8, again with a similar automated differential.  Her hemoglobin was 11.4 with an MCV of 87.3.  Platelets were 286,000.  She denies any B symptoms such as, fevers, chills, night sweats, fatigue, appetite changes or weight loss.  She denies recurrent infections.  She denies steroid use.  She denies smoking.  She has osteoarthritis.  She has had a recent pelvic exam and Pap smear, which was apparently normal.  She has mammogram scheduled for next week.  Her last colonoscopy with Dr. Lyndel Safe in 2009 negative.  She was recently given Cologuard kit at Dr. Tenna Delaine.  In addition to above, she has multiple medical problems, most notably, diabetes mellitis type II, obesity, hypertension, atrial fibrillation, diastolic heart failure, unstable angina, hypertension, pulmonary hypertension, chronic kidney disease, and  hyperlipidemia.  She was previously felt to have hypothyroidism, but is no longer on thyroid replacement and her TSH has been normal per Dr. Leafy Ro.  Her father had myelodysplasia at age 16, her brother had kidney cancer and prostate cancer at age 74, a paternal aunt had breast cancer in her 26's, a paternal cousin had breast in her 51's, another paternal cousin had bone cancer of unknown primary in the 51's. Only her brother is living.  She does not know if anyone had genetic testing.  REVIEW OF SYSTEMS:  Review of Systems  Constitutional:  Negative for appetite change, chills, fatigue, fever and unexpected weight change.  HENT:   Negative for lump/mass, mouth sores and sore throat.   Respiratory:  Negative for cough and shortness of breath.   Cardiovascular:  Negative for chest pain and leg swelling.  Gastrointestinal:  Negative for abdominal pain, constipation, diarrhea, nausea and vomiting.  Endocrine: Negative for hot flashes.  Genitourinary:  Negative for difficulty urinating, dysuria, frequency, hematuria and vaginal bleeding.   Musculoskeletal:  Negative for arthralgias, back pain and myalgias.  Skin:  Negative for rash.  Neurological:  Negative for dizziness and headaches.  Hematological:  Negative for adenopathy. Does not bruise/bleed easily.  Psychiatric/Behavioral:  Positive for sleep disturbance (Occasional difficulty sleeping). Negative for depression. The patient is not nervous/anxious.      VITALS:  Blood pressure (!) 155/79, pulse 80, temperature 98.1 F (36.7 C), temperature source Oral, resp. rate 16, height '5\' 7"'$  (1.702 m), weight 268 lb (121.6 kg), SpO2 100 %.  Wt Readings from Last 3 Encounters:  04/09/22 268  lb (121.6 kg)  03/25/22 262 lb (118.8 kg)  02/18/22 259 lb (117.5 kg)    Body mass index is 41.97 kg/m.  Performance status (ECOG): 0 - Asymptomatic  PHYSICAL EXAM:  Physical Exam Vitals and nursing note reviewed.  Constitutional:      General: She is  not in acute distress.    Appearance: Normal appearance.  HENT:     Head: Normocephalic and atraumatic.     Mouth/Throat:     Mouth: Mucous membranes are moist.     Pharynx: Oropharynx is clear. No oropharyngeal exudate or posterior oropharyngeal erythema.  Eyes:     General: No scleral icterus.    Extraocular Movements: Extraocular movements intact.     Conjunctiva/sclera: Conjunctivae normal.     Pupils: Pupils are equal, round, and reactive to light.  Cardiovascular:     Rate and Rhythm: Normal rate and regular rhythm.     Heart sounds: Normal heart sounds. No murmur heard.    No friction rub. No gallop.  Pulmonary:     Effort: Pulmonary effort is normal.     Breath sounds: Normal breath sounds. No wheezing, rhonchi or rales.  Abdominal:     General: There is no distension.     Palpations: Abdomen is soft. There is no hepatomegaly, splenomegaly or mass.     Tenderness: There is no abdominal tenderness.  Musculoskeletal:        General: Normal range of motion.     Cervical back: Normal range of motion and neck supple. No tenderness.     Right lower leg: No edema.     Left lower leg: No edema.  Lymphadenopathy:     Cervical: No cervical adenopathy.     Upper Body:     Right upper body: No supraclavicular or axillary adenopathy.     Left upper body: No supraclavicular or axillary adenopathy.     Lower Body: No right inguinal adenopathy. No left inguinal adenopathy.  Skin:    General: Skin is warm and dry.     Coloration: Skin is not jaundiced.     Findings: No rash.  Neurological:     Mental Status: She is alert and oriented to person, place, and time.     Cranial Nerves: No cranial nerve deficit.  Psychiatric:        Mood and Affect: Mood normal.        Behavior: Behavior normal.        Thought Content: Thought content normal.     LABS:      Latest Ref Rng & Units 04/09/2022   12:00 AM 01/20/2022   11:02 AM 06/23/2021   11:22 AM  CBC  WBC  10.5     13.4  12.4    Hemoglobin 12.0 - 16.0 11.7     11.5  11.4   Hematocrit 36 - 46 35     35.6  35.0   Platelets 150 - 400 K/uL 295     339  336      This result is from an external source.      Latest Ref Rng & Units 01/20/2022   11:02 AM 09/24/2021   11:16 AM 06/23/2021   11:22 AM  CMP  Glucose 70 - 99 mg/dL 140  143  130   BUN 8 - 27 mg/dL '20  21  20   '$ Creatinine 0.57 - 1.00 mg/dL 1.31  1.29  1.28   Sodium 134 - 144 mmol/L 137  138  142  Potassium 3.5 - 5.2 mmol/L 3.5  3.9  4.2   Chloride 96 - 106 mmol/L 95  98  100   CO2 20 - 29 mmol/L '24  24  26   '$ Calcium 8.7 - 10.3 mg/dL 9.6  9.6  9.7   Total Protein 6.0 - 8.5 g/dL 7.2  7.0  7.4   Total Bilirubin 0.0 - 1.2 mg/dL 0.4  <0.2  0.3   Alkaline Phos 44 - 121 IU/L 72  67  70   AST 0 - 40 IU/L '12  12  10   '$ ALT 0 - 32 IU/L '7  11  10    '$ *** Dr. Bobby Rumpf personally reviewed the patient's peripheral blood smear today.  There was no peripheral blast.  The white blood cells and red blood cells were of normal morphology. There was no schistocytosis or anisocytosis.  The platelets are of normal size and there was no platelet clumping.   STUDIES:  None   HISTORY:   Past Medical History:  Diagnosis Date   Chronic atrial fibrillation, unspecified ([place]) 03/28/2019   Depressed left ventricular ejection fraction 03/28/2019   Essential hypertension    Gastro-esophageal reflux disease without esophagitis    Generalized anxiety disorder    Hypertensive disorder 03/17/2020   Hypothyroidism    Mixed hyperlipidemia    Morbid obesity ([place]) 03/27/2019   Nontoxic multinodular goiter    Occlusion and stenosis of bilateral carotid arteries 03/31/2020   Other vitamin B12 deficiency anemias    Plantar fasciitis 12/17/2019   Polyp at cervical os 03/27/2019   Primary generalized (osteo)arthritis    Pulmonary hypertension ([place]) 03/28/2019   Shortness of breath 04/02/2020   Tightness of heel cord, left 12/17/2019   Type 2 diabetes mellitus with hyperglycemia ([place])     Unstable angina (HCC)    Vitamin D deficiency     Past Surgical History:  Procedure Laterality Date   CESAREAN SECTION  2002   CHOLECYSTECTOMY  1994    Family History  Problem Relation Age of Onset   Thyroid disease Mother    Heart failure Mother    Diabetes Mother    Heart disease Mother    Hypertension Mother    Stroke Mother    Kidney disease Mother    Obesity Mother    Hypertension Sister    Heart disease Sister    Multiple sclerosis Sister    Kidney cancer Brother    Colon cancer Brother    Colon cancer Paternal Aunt    Breast cancer Paternal Aunt 30   Breast cancer Cousin 50    Social History:  reports that she has never smoked. She has never used smokeless tobacco. She reports that she does not drink alcohol and does not use drugs.The patient is accompanied by her niece, Lenna Sciara, today.  She is divorced and lives with her son.  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  Allergies:  Allergies  Allergen Reactions   Dapagliflozin Other (See Comments)    Unknown   Lipitor [Atorvastatin] Swelling    Tachycardia   Latex Rash    Current Medications: Current Outpatient Medications  Medication Sig Dispense Refill   ALPRAZolam (XANAX) 0.5 MG tablet Take 0.5 mg by mouth 3 (three) times daily as needed for anxiety or sleep.     apixaban (ELIQUIS) 5 MG TABS tablet Take 1 tablet (5 mg total) by mouth 2 (two) times daily. 180 tablet 3   Cholecalciferol (VITAMIN D) 50 MCG (2000 UT) tablet Take 2,000 Units  by mouth daily.     digoxin (LANOXIN) 0.125 MG tablet TAKE 1 TABLET BY MOUTH DAILY 90 tablet 3   docusate sodium (COLACE) 100 MG capsule Take 1 capsule (100 mg total) by mouth 2 (two) times daily. 60 capsule 0   ergocalciferol (VITAMIN D2) 1.25 MG (50000 UT) capsule Take 1 capsule (50,000 Units total) by mouth once a week. 4 capsule 0   furosemide (LASIX) 40 MG tablet Take 1 tablet (40 mg total) by mouth 2 (two) times daily. 180 tablet 1   hydrochlorothiazide (HYDRODIURIL) 25  MG tablet TAKE 1 TABLET BY MOUTH EVERY DAY 90 tablet 1   metoprolol succinate (TOPROL-XL) 25 MG 24 hr tablet Take 1 tablet (25 mg total) by mouth daily. 90 tablet 3   nitroGLYCERIN (NITROSTAT) 0.4 MG SL tablet Place under the tongue.     olmesartan (BENICAR) 20 MG tablet Take 1 tablet (20 mg total) by mouth daily. 90 tablet 1   pantoprazole (PROTONIX) 40 MG tablet Take 1 tablet (40 mg total) by mouth daily. 30 tablet 0   pioglitazone (ACTOS) 30 MG tablet Take 30 mg by mouth daily.     Semaglutide, 2 MG/DOSE, 8 MG/3ML SOPN Inject 2 mg as directed once a week. 3 mL 0   No current facility-administered medications for this visit.     ASSESSMENT & PLAN:   Assessment/Plan:  Dari Martorella is a 62 y.o. female leukocytosis of uncertain etiology for the past year, which has been stable.  She does not have evidence of a hematologic malignancy, especially given the fact that her white blood cell count is normal.  She is not on any medications that would typically raise her blood cell count.  She does not smoke.  She has borderline anemia, which has improved.  Her family history is concerning for possible hereditary cancer syndrome.  As she is unaffected, I recommended she ask her brother about for genetic testing, but I would be glad to refer her as well  I discussed the assessment and plan with the patient and her niece.  The patient was provided an opportunity to ask questions and all were answered.  The patient agreed with the plan and demonstrated an understanding of the instructions.  The patient was advised to call back if she has worsening leukocytosis or other concerns.  Thank you for the referral.   I provided 30 minutes of face-to-face time during this encounter and > 50% was spent counseling as documented under my assessment and plan.    Marvia Pickles, PA-C

## 2022-04-11 ENCOUNTER — Encounter: Payer: Self-pay | Admitting: Hematology and Oncology

## 2022-04-12 NOTE — Progress Notes (Signed)
Chief Complaint:   OBESITY Suzanne Carey is here to discuss her progress with her obesity treatment plan along with follow-up of her obesity related diagnoses. Suzanne Carey is on the Category 2 Plan and states she is following her eating plan approximately 80% of the time. Suzanne Carey states she is walking for 10-15 minutes 4 times per week.  Today's visit was #: 22 Starting weight: 318 lbs Starting date: 07/22/2020 Today's weight: 262 lbs Today's date: 03/25/2022 Total lbs lost to date: 56 Total lbs lost since last in-office visit: 0  Interim History: Suzanne Carey is retaining some water weight.  She continues to work on her diet and exercise.  She notes her hunger is reasonably well-controlled.  Subjective:   1. Gastroesophageal reflux disease, unspecified whether esophagitis present Suzanne Carey notes GERD symptoms are worsening.  She is on OTC omeprazole 20 mg because insurance would not pay.  She notes belching more as well.  2. Type 2 diabetes mellitus with other specified complication, unspecified whether long term insulin use ([place]) Suzanne Carey is on Actos and Ozempic 2 mg.  She is working on her diet and weight loss.  She has no signs of hypoglycemia.  3. Vitamin D deficiency Suzanne Carey is on vitamin D prescription with no side effects noted.  She requests a refill today.  Assessment/Plan:   1. Gastroesophageal reflux disease, unspecified whether esophagitis present Suzanne Carey agreed to change to Protonix 40 mg once daily with no refills, and we will discontinue omeprazole OTC.  - pantoprazole (PROTONIX) 40 MG tablet; Take 1 tablet (40 mg total) by mouth daily.  Dispense: 30 tablet; Refill: 0  2. Type 2 diabetes mellitus with other specified complication, unspecified whether long term insulin use ([place]) Suzanne Carey will continue Ozempic 2 mg once weekly, and we will refill for 1 month.  She will continue to work on her diet.  - Semaglutide, 2 MG/DOSE, 8 MG/3ML SOPN; Inject 2 mg as directed once a week.  Dispense: 3 mL;  Refill: 0  3. Vitamin D deficiency Suzanne Carey will continue prescription vitamin D, and we will refill for 1 month.  We will continue to follow.  - ergocalciferol (VITAMIN D2) 1.25 MG (50000 UT) capsule; Take 1 capsule (50,000 Units total) by mouth once a week.  Dispense: 4 capsule; Refill: 0  4. BMI 40.0-44.9, adult ([place])  5. Obesity, Beginning BMI 49.81 Suzanne Carey is currently in the action stage of change. As such, her goal is to continue with weight loss efforts. She has agreed to the Category 2 Plan.   Exercise goals: As is.   Behavioral modification strategies: increasing lean protein intake.  Suzanne Carey has agreed to follow-up with our clinic in 4 weeks. She was informed of the importance of frequent follow-up visits to maximize her success with intensive lifestyle modifications for her multiple health conditions.   Objective:   Blood pressure 111/68, pulse 80, temperature (!) 97.3 F (36.3 C), height '5\' 7"'$  (1.702 m), weight 262 lb (118.8 kg), SpO2 96 %. Body mass index is 41.04 kg/m.  General: Cooperative, alert, well developed, in no acute distress. HEENT: Conjunctivae and lids unremarkable. Cardiovascular: Regular rhythm.  Lungs: Normal work of breathing. Neurologic: No focal deficits.   Lab Results  Component Value Date   CREATININE 1.31 (H) 01/20/2022   BUN 20 01/20/2022   NA 137 01/20/2022   K 3.5 01/20/2022   CL 95 (L) 01/20/2022   CO2 24 01/20/2022   Lab Results  Component Value Date   ALT 7 01/20/2022   AST  12 01/20/2022   ALKPHOS 72 01/20/2022   BILITOT 0.4 01/20/2022   Lab Results  Component Value Date   HGBA1C 7.8 (H) 01/20/2022   HGBA1C 7.0 (H) 09/24/2021   HGBA1C 7.2 (H) 06/23/2021   HGBA1C 7.1 (H) 02/24/2021   HGBA1C 8.8 (H) 10/27/2020   Lab Results  Component Value Date   INSULIN 16.9 01/20/2022   INSULIN 29.5 (H) 09/24/2021   INSULIN 18.2 06/23/2021   INSULIN 11.1 02/24/2021   INSULIN 15.7 10/27/2020   Lab Results  Component Value Date   TSH  2.960 09/24/2021   Lab Results  Component Value Date   CHOL 193 01/20/2022   HDL 30 (L) 01/20/2022   LDLCALC 126 (H) 01/20/2022   TRIG 208 (H) 01/20/2022   CHOLHDL 4.4 03/28/2019   Lab Results  Component Value Date   VD25OH 65.8 01/20/2022   VD25OH 68.2 09/24/2021   VD25OH 80.5 06/23/2021   Lab Results  Component Value Date   WBC 10.5 04/09/2022   HGB 11.7 (A) 04/09/2022   HCT 35 (A) 04/09/2022   MCV 86 01/20/2022   PLT 295 04/09/2022   No results found for: "IRON", "TIBC", "FERRITIN"  Attestation Statements:   Reviewed by clinician on day of visit: allergies, medications, problem list, medical history, surgical history, family history, social history, and previous encounter notes.   I, Trixie Dredge, am acting as transcriptionist for Dennard Nip, MD.  I have reviewed the above documentation for accuracy and completeness, and I agree with the above. -  Dennard Nip, MD

## 2022-04-13 ENCOUNTER — Telehealth: Payer: Self-pay

## 2022-04-13 ENCOUNTER — Ambulatory Visit
Admission: RE | Admit: 2022-04-13 | Discharge: 2022-04-13 | Disposition: A | Discharge status: Home / Self Care | Payer: BC Managed Care – PPO | Source: Ambulatory Visit | Attending: Obstetrics and Gynecology | Admitting: Obstetrics and Gynecology

## 2022-04-13 DIAGNOSIS — Z1231 Encounter for screening mammogram for malignant neoplasm of breast: Secondary | ICD-10-CM

## 2022-04-13 NOTE — Telephone Encounter (Signed)
Patient request lab results from last visit with [facility] can you review and let me know I can call her back.

## 2022-04-14 NOTE — Telephone Encounter (Signed)
Patient notified and voiced understanding.

## 2022-04-21 ENCOUNTER — Encounter: Payer: Self-pay | Admitting: Hematology and Oncology

## 2022-04-22 ENCOUNTER — Ambulatory Visit (INDEPENDENT_AMBULATORY_CARE_PROVIDER_SITE_OTHER): Payer: BC Managed Care – PPO | Admitting: Family Medicine

## 2022-04-22 ENCOUNTER — Encounter (INDEPENDENT_AMBULATORY_CARE_PROVIDER_SITE_OTHER): Payer: Self-pay | Admitting: Family Medicine

## 2022-04-22 ENCOUNTER — Telehealth (INDEPENDENT_AMBULATORY_CARE_PROVIDER_SITE_OTHER): Payer: Self-pay | Admitting: Family Medicine

## 2022-04-22 VITALS — BP 108/63 | HR 96 | Temp 97.4°F | Ht 67.0 in | Wt 262.0 lb

## 2022-04-22 DIAGNOSIS — Z6841 Body Mass Index (BMI) 40.0 and over, adult: Secondary | ICD-10-CM

## 2022-04-22 DIAGNOSIS — E669 Obesity, unspecified: Secondary | ICD-10-CM | POA: Diagnosis not present

## 2022-04-22 DIAGNOSIS — E1169 Type 2 diabetes mellitus with other specified complication: Secondary | ICD-10-CM

## 2022-04-22 DIAGNOSIS — Z7984 Long term (current) use of oral hypoglycemic drugs: Secondary | ICD-10-CM

## 2022-04-22 DIAGNOSIS — K219 Gastro-esophageal reflux disease without esophagitis: Secondary | ICD-10-CM | POA: Diagnosis not present

## 2022-04-22 MED ORDER — PANTOPRAZOLE SODIUM 40 MG PO TBEC: 40.0000 mg | DELAYED_RELEASE_TABLET | Freq: Every day | ORAL | 0 refills | Status: DC

## 2022-04-22 NOTE — Telephone Encounter (Signed)
PA submitted via CoverMymeds.

## 2022-04-26 NOTE — Telephone Encounter (Signed)
Prior authorization denied for patients pantoprazole. Patient notified.

## 2022-04-28 ENCOUNTER — Other Ambulatory Visit: Payer: Self-pay | Admitting: Cardiology

## 2022-04-28 NOTE — Progress Notes (Signed)
Chief Complaint:   OBESITY Suzanne Carey is here to discuss her progress with her obesity treatment plan along with follow-up of her obesity related diagnoses. Suzanne Carey is on the Category 2 Plan and states she is following her eating plan approximately 75% of the time. Suzanne Carey states she is walking for 10-15 minutes 3 times per week.  Today's visit was #: 23 Starting weight: 318 lbs Starting date: 07/22/2020 Today's weight: 262 lbs Today's date: 04/22/2022 Total lbs lost to date: 56 Total lbs lost since last in-office visit: 0  Interim History: Suzanne Carey has done well with maintaining her weight.  She has had extra challenges this last month, but she continues to be mindful of her options.  Subjective:   1. Gastroesophageal reflux disease, unspecified whether esophagitis present Suzanne Carey was unable to get Protonix due to prior authorization running out.  Her symptoms have worsened and OTC PPI have not helped.  2. Type 2 diabetes mellitus with other specified complication, unspecified whether long term insulin use (Suzanne Carey) Suzanne Carey cannot afford to stay on GLP-1.  Her PCP started her on glipizide.  She cannot tolerate metformin due to GI upset.  Assessment/Plan:   1. Gastroesophageal reflux disease, unspecified whether esophagitis present We will refill Protonix for 1 month.  Suzanne Carey needs prior authorization, and was done today per [facility].  - pantoprazole (PROTONIX) 40 MG tablet; Take 1 tablet (40 mg total) by mouth daily.  Dispense: 30 tablet; Refill: 0  2. Type 2 diabetes mellitus with other specified complication, unspecified whether long term insulin use ([facility]) Suzanne Carey will continue to work on her diet and exercise, and we will recheck labs in 1 month.  3. BMI 40.0-44.9, adult ([place])  4. Obesity, Beginning BMI 49.81 Suzanne Carey is currently in the action stage of change. As such, her goal is to continue with weight loss efforts. She has agreed to the Category 3 Plan.   Exercise goals: As is.   Behavioral  modification strategies: increasing lean protein intake.  Suzanne Carey has agreed to follow-up with our clinic in 4 weeks. She was informed of the importance of frequent follow-up visits to maximize her success with intensive lifestyle modifications for her multiple health conditions.   Objective:   Blood pressure 108/63, pulse 96, temperature (!) 97.4 F (36.3 C), height 5\' 7"  (1.702 m), weight 262 lb (118.8 kg), SpO2 94 %. Body mass index is 41.04 kg/m.  Lab Results  Component Value Date   CREATININE 1.31 (H) 01/20/2022   BUN 20 01/20/2022   NA 137 01/20/2022   K 3.5 01/20/2022   CL 95 (L) 01/20/2022   CO2 24 01/20/2022   Lab Results  Component Value Date   ALT 7 01/20/2022   AST 12 01/20/2022   ALKPHOS 72 01/20/2022   BILITOT 0.4 01/20/2022   Lab Results  Component Value Date   HGBA1C 7.8 (H) 01/20/2022   HGBA1C 7.0 (H) 09/24/2021   HGBA1C 7.2 (H) 06/23/2021   HGBA1C 7.1 (H) 02/24/2021   HGBA1C 8.8 (H) 10/27/2020   Lab Results  Component Value Date   INSULIN 16.9 01/20/2022   INSULIN 29.5 (H) 09/24/2021   INSULIN 18.2 06/23/2021   INSULIN 11.1 02/24/2021   INSULIN 15.7 10/27/2020   Lab Results  Component Value Date   TSH 2.960 09/24/2021   Lab Results  Component Value Date   CHOL 193 01/20/2022   HDL 30 (L) 01/20/2022   LDLCALC 126 (H) 01/20/2022   TRIG 208 (H) 01/20/2022   CHOLHDL 4.4 03/28/2019  Lab Results  Component Value Date   VD25OH 65.8 01/20/2022   VD25OH 68.2 09/24/2021   VD25OH 80.5 06/23/2021   Lab Results  Component Value Date   WBC 10.5 04/09/2022   HGB 11.7 (A) 04/09/2022   HCT 35 (A) 04/09/2022   MCV 86 01/20/2022   PLT 295 04/09/2022   No results found for: "IRON", "TIBC", "FERRITIN"  Attestation Statements:   Reviewed by clinician on day of visit: allergies, medications, problem list, medical history, surgical history, family history, social history, and previous encounter notes.  I have personally spent 45 minutes total time  today in preparation, patient care, and documentation for this visit, including the following: review of clinical lab tests; review of medical tests/procedures/services.  I, Trixie Dredge, am acting as transcriptionist for Dennard Nip, MD.  I have reviewed the above documentation for accuracy and completeness, and I agree with the above. -  Dennard Nip, MD

## 2022-05-10 ENCOUNTER — Ambulatory Visit: Payer: BC Managed Care – PPO | Admitting: Cardiology

## 2022-05-11 NOTE — Progress Notes (Signed)
Cardiology Office Note:    Date:  05/12/2022   ID:  Suzanne Carey, DOB [birth year], MRN [id number]  PCP:  Galvin Proffer, MD  Cardiologist:  Norman Herrlich, MD    Referring MD: Galvin Proffer, MD    ASSESSMENT:    1. Chronic atrial fibrillation, unspecified   2. High risk medication use   3. Chronic anticoagulation   4. Hypertensive heart disease with heart failure    PLAN:    In order of problems listed above:  She continues to do well with atrial fibrillation asymptomatic rate controlled with beta-blocker and digoxin no signs of toxicity Provide she has labs done at her PCP I feel comfortable seeing her in 1 year she needs a digoxin level at least every 6 months and I told her the goal is for the level to be less than 1 to avoid excess mortality when used for atrial fibrillation or heart failure Continue her current anticoagulant Very well no volume overload she will continue her loop diuretic and ARB.   Next appointment: 1 year   Medication Adjustments/Labs and Tests Ordered: Current medicines are reviewed at length with the patient today.  Concerns regarding medicines are outlined above.  No orders of the defined types were placed in this encounter.  No orders of the defined types were placed in this encounter.   Chief Complaint  Patient presents with   Follow-up    History of Present Illness:    Suzanne Carey is a 62 y.o. female with a hx of chronic atrial fibrillation rate controlled with beta-blocker and digoxin and with chronic anticoagulation hypertensive heart disease with heart failure hyperlipidemia and type 2 diabetes last seen 07/28/2021.  Compliance with diet, lifestyle and medications: Yes  She is very frustrated with healthcare and is having trouble accessing medications such as her anticoagulant and Ozempic has been remarkably effective for her obesity and weight loss and diabetes and heart failure I told her to contact the company  directly and see if they have any currently that would assist Digoxin level in December was therapeutic 0.8 She is not having symptoms of toxicity nausea vomiting or visual change He is having no edema chest pain palpitation or syncope She tolerates her anticoagulant without bleeding Past Medical History:  Diagnosis Date   Chronic atrial fibrillation, unspecified 03/28/2019   Depressed left ventricular ejection fraction 03/28/2019   Essential hypertension    Gastro-esophageal reflux disease without esophagitis    Generalized anxiety disorder    Hypertensive disorder 03/17/2020   Hypothyroidism    Mixed hyperlipidemia    Morbid obesity 03/27/2019   Nontoxic multinodular goiter    Occlusion and stenosis of bilateral carotid arteries 03/31/2020   Other vitamin B12 deficiency anemias    Plantar fasciitis 12/17/2019   Polyp at cervical os 03/27/2019   Primary generalized (osteo)arthritis    Pulmonary hypertension 03/28/2019   Shortness of breath 04/02/2020   Tightness of heel cord, left 12/17/2019   Type 2 diabetes mellitus with hyperglycemia    Unstable angina    Vitamin D deficiency     Past Surgical History:  Procedure Laterality Date   CESAREAN SECTION  2002   CHOLECYSTECTOMY  1994    Current Medications: Current Meds  Medication Sig   ALPRAZolam (XANAX) 0.5 MG tablet Take 0.5 mg by mouth 3 (three) times daily as needed for anxiety or sleep.   apixaban (ELIQUIS) 5 MG TABS tablet Take 1 tablet (5 mg total) by mouth 2 (two) times  daily.   Cholecalciferol (VITAMIN D) 50 MCG (2000 UT) tablet Take 2,000 Units by mouth daily.   digoxin (LANOXIN) 0.125 MG tablet TAKE 1 TABLET BY MOUTH DAILY   docusate sodium (COLACE) 100 MG capsule Take 1 capsule (100 mg total) by mouth 2 (two) times daily.   ergocalciferol (VITAMIN D2) 1.25 MG (50000 UT) capsule Take 1 capsule (50,000 Units total) by mouth once a week.   furosemide (LASIX) 40 MG tablet TAKE 1 TABLET BY MOUTH TWICE A DAY    glipiZIDE (GLUCOTROL XL) 5 MG 24 hr tablet Take 5 mg by mouth daily with breakfast.   hydrochlorothiazide (HYDRODIURIL) 25 MG tablet TAKE 1 TABLET BY MOUTH EVERY DAY   metoprolol succinate (TOPROL-XL) 25 MG 24 hr tablet Take 1 tablet (25 mg total) by mouth daily.   nitroGLYCERIN (NITROSTAT) 0.4 MG SL tablet Place under the tongue.   olmesartan (BENICAR) 20 MG tablet Take 1 tablet (20 mg total) by mouth daily.   pantoprazole (PROTONIX) 40 MG tablet Take 1 tablet (40 mg total) by mouth daily.   pioglitazone (ACTOS) 30 MG tablet Take 30 mg by mouth daily.     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  Tobacco Use   Smoking status: Never   Smokeless tobacco: Never  Vaping Use   Vaping Use: Never used  Substance and Sexual Activity   Alcohol use: Never   Drug use: Never   Sexual activity: Not on file  Other Topics Concern   Not on file  Social History Narrative   Not on file   Social Determinants of Health   Financial Resource Strain: Not on file  Food Insecurity: No Food Insecurity (04/09/2022)   Hunger Vital Sign    Worried About Running Out of Food in the Last Year: Never true    Ran Out of Food in the Last Year: Never true  Transportation Needs: No Transportation Needs (04/09/2022)   PRAPARE - Administrator, Civil Service (Medical): No    Lack of Transportation (Non-Medical): No  Physical Activity: Not on file  Stress: Not on file  Social Connections: Not on file     Family History: The patient's family history includes Bone cancer (age of onset: 83 - 54) in her cousin; Breast cancer (age of onset: 41) in her cousin and paternal aunt; Colon cancer in her brother and paternal aunt;  Diabetes in her mother; Heart disease in her mother and sister; Heart failure in her mother; Hypertension in her mother and sister; Kidney cancer in her brother; Kidney disease in her mother; Multiple sclerosis in her sister; Myelodysplastic syndrome (age of onset: 53) in her father; Obesity in her mother; Stroke in her mother; Thyroid disease in her mother. ROS:   Please see the history of present illness.    All other systems reviewed and are negative.  EKGs/Labs/Other Studies Reviewed:    The following studies were reviewed today:        Recent Labs: 09/24/2021: TSH 2.960 01/20/2022: ALT 7; BUN 20; Creatinine, Ser 1.31; Potassium 3.5; Sodium 137 04/09/2022: Hemoglobin 11.7; Platelets 295  Recent Lipid Panel    Component Value Date/Time  CHOL 193 01/20/2022 1102   TRIG 208 (H) 01/20/2022 1102   HDL 30 (L) 01/20/2022 1102   CHOLHDL 4.4 03/28/2019 1136   LDLCALC 126 (H) 01/20/2022 1102    Physical Exam:    VS:  BP 130/83 (BP Location: Right Arm, Patient Position: Sitting, Cuff Size: Large)   Pulse 80   Ht 5\' 7"  (1.702 m)   Wt 275 lb (124.7 kg)   SpO2 97%   BMI 43.07 kg/m     Wt Readings from Last 3 Encounters:  05/12/22 275 lb (124.7 kg)  04/22/22 262 lb (118.8 kg)  04/09/22 268 lb (121.6 kg)     GEN:  Well nourished, well developed in no acute distress HEENT: Normal NECK: No JVD; No carotid bruits LYMPHATICS: No lymphadenopathy CARDIAC: Regular rate and rhythm RESPIRATORY:  Clear to auscultation without rales, wheezing or rhonchi  ABDOMEN: Soft, non-tender, non-distended MUSCULOSKELETAL:  No edema; No deformity  SKIN: Warm and dry NEUROLOGIC:  Alert and oriented x 3 PSYCHIATRIC:  Normal affect    Signed, Norman Herrlich, MD  05/12/2022 11:25 AM    [facility]

## 2022-05-12 ENCOUNTER — Ambulatory Visit: Payer: BC Managed Care – PPO | Attending: Cardiology | Admitting: Cardiology

## 2022-05-12 ENCOUNTER — Encounter: Payer: Self-pay | Admitting: Cardiology

## 2022-05-12 VITALS — BP 130/83 | HR 80 | Ht 67.0 in | Wt 275.0 lb

## 2022-05-12 DIAGNOSIS — I482 Chronic atrial fibrillation, unspecified: Secondary | ICD-10-CM

## 2022-05-12 DIAGNOSIS — Z7901 Long term (current) use of anticoagulants: Secondary | ICD-10-CM | POA: Diagnosis not present

## 2022-05-12 DIAGNOSIS — I110 Hypertensive heart disease with heart failure: Secondary | ICD-10-CM | POA: Diagnosis not present

## 2022-05-12 DIAGNOSIS — Z79899 Other long term (current) drug therapy: Secondary | ICD-10-CM | POA: Diagnosis not present

## 2022-05-12 NOTE — Patient Instructions (Signed)
Medication Instructions:  Your physician recommends that you continue on your current medications as directed. Please refer to the Current Medication list given to you today.  *If you need a refill on your cardiac medications before your next appointment, please call your pharmacy*   Lab Work: None If you have labs (blood work) drawn today and your tests are completely normal, you will receive your results only by: MyChart Message (if you have MyChart) OR A paper copy in the mail If you have any lab test that is abnormal or we need to change your treatment, we will call you to review the results.   Testing/Procedures: None   Follow-Up: At [facility], you and your health needs are our priority.  As part of our continuing mission to provide you with exceptional heart care, we have created designated Provider Care Teams.  These Care Teams include your primary Cardiologist (physician) and Advanced Practice Providers (APPs -  Physician Assistants and Nurse Practitioners) who all work together to provide you with the care you need, when you need it.  We recommend signing up for the patient portal called "MyChart".  Sign up information is provided on this After Visit Summary.  MyChart is used to connect with patients for Virtual Visits (Telemedicine).  Patients are able to view lab/test results, encounter notes, upcoming appointments, etc.  Non-urgent messages can be sent to your provider as well.   To learn more about what you can do with MyChart, go to [email or web address].    Your next appointment:   1 year(s)  Provider:   Brian Munley, MD    Other Instructions None  

## 2022-05-26 ENCOUNTER — Encounter (INDEPENDENT_AMBULATORY_CARE_PROVIDER_SITE_OTHER): Payer: Self-pay | Admitting: Family Medicine

## 2022-05-26 ENCOUNTER — Ambulatory Visit (INDEPENDENT_AMBULATORY_CARE_PROVIDER_SITE_OTHER): Payer: BC Managed Care – PPO | Admitting: Family Medicine

## 2022-05-26 VITALS — BP 130/83 | HR 83 | Ht 67.0 in | Wt 273.0 lb

## 2022-05-26 DIAGNOSIS — E1165 Type 2 diabetes mellitus with hyperglycemia: Secondary | ICD-10-CM | POA: Diagnosis not present

## 2022-05-26 DIAGNOSIS — K219 Gastro-esophageal reflux disease without esophagitis: Secondary | ICD-10-CM | POA: Diagnosis not present

## 2022-05-26 DIAGNOSIS — E559 Vitamin D deficiency, unspecified: Secondary | ICD-10-CM | POA: Diagnosis not present

## 2022-05-26 DIAGNOSIS — E039 Hypothyroidism, unspecified: Secondary | ICD-10-CM | POA: Diagnosis not present

## 2022-05-26 DIAGNOSIS — Z7984 Long term (current) use of oral hypoglycemic drugs: Secondary | ICD-10-CM

## 2022-05-26 DIAGNOSIS — E669 Obesity, unspecified: Secondary | ICD-10-CM

## 2022-05-26 DIAGNOSIS — D518 Other vitamin B12 deficiency anemias: Secondary | ICD-10-CM

## 2022-05-26 DIAGNOSIS — Z6841 Body Mass Index (BMI) 40.0 and over, adult: Secondary | ICD-10-CM

## 2022-05-26 MED ORDER — ERGOCALCIFEROL 1.25 MG (50000 UT) PO CAPS: 50000.0000 [IU] | ORAL_CAPSULE | ORAL | 0 refills | Status: DC

## 2022-05-26 NOTE — Progress Notes (Unsigned)
Chief Complaint:   OBESITY Suzanne Carey is here to discuss her progress with her obesity treatment plan along with follow-up of her obesity related diagnoses. Suzanne Carey is on the Category 3 Plan and states she is following her eating plan approximately 75% of the time. Suzanne Carey states she is walking for 10-15 minutes 3 times per week.  Today's visit was #: 24 Starting weight: 318 lbs Starting date: 07/22/2020 Today's weight: 273 lbs Today's date: 05/26/2022 Total lbs lost to date: 45 Total lbs lost since last in-office visit: 0  Interim History: Suzanne Carey has been off Ozempic due to insurance no longer covering for her diabetes mellitus. She notes polyphagia has increased significantly. She has increased snacking and may not be eating all of her protein.   Subjective:   1. Vitamin D deficiency Suzanne Carey is on Vitamin D prescription and OTC Vitamin D.   2. Type 2 diabetes mellitus with hyperglycemia, without long-term current use of insulin Suzanne Carey has been doing very well with Ozempic controlling her glucose. Her insurance will no longer cover and she was changed to glipizide and she had GI upset with metformin.  They will not cover any brand new drugs per the patient.  3. Other vitamin B12 deficiency anemias Suzanne Carey is due for labs, and she is on a B12 rich diet.  4. Gastroesophageal reflux disease, unspecified whether esophagitis present Suzanne Carey will no longer pay for PPI prescription.  5. Acquired hypothyroidism Suzanne Carey is on Suzanne Carey, and she denies tremors or palpitations.  Assessment/Plan:   1. Vitamin D deficiency We will check labs today, and we will refill prescription vitamin D for 1 month.  - VITAMIN D 25 Hydroxy (Vit-D Deficiency, Fractures) - ergocalciferol (VITAMIN D2) 1.25 MG (50000 UT) capsule; Take 1 capsule (50,000 Units total) by mouth once a week.  Dispense: 4 capsule; Refill: 0  2. Type 2 diabetes mellitus with hyperglycemia, without long-term current use of  insulin We will check labs today.  Suzanne Carey will continue glipizide and may look at restarting metformin.  - CMP14+EGFR - Lipid Panel With LDL/HDL Ratio - Insulin, random - Hemoglobin A1c  3. Other vitamin B12 deficiency anemias We will check labs today, and we will follow-up at Suzanne Carey next visit.  - Vitamin B12 - CBC with Differential/Platelet  4. Gastroesophageal reflux disease, unspecified whether esophagitis present Suzanne Carey is to take Suzanne Carey 20 mg 2 tablets once daily.  5. Acquired hypothyroidism We will check labs today, and we will follow-up at Suzanne Carey next visit.  - Suzanne Carey  6. BMI 40.0-44.9, adult  7. Obesity, Beginning BMI 49.81 Suzanne Carey is currently in the action stage of change. As such, her goal is to continue with weight loss efforts. She has agreed to the Category 3 Plan.   Exercise goals: As is.   Behavioral modification strategies: increasing lean protein intake, no skipping meals, and meal planning and cooking strategies.  Suzanne Carey has agreed to follow-up with our clinic in 4 weeks. She was informed of the importance of frequent follow-up visits to maximize her success with intensive lifestyle modifications for her multiple health conditions.   Suzanne Carey was informed we would discuss her lab results at her next visit unless there is a critical issue that needs to be addressed sooner. Suzanne Carey agreed to keep her next visit at the agreed upon time to discuss these results.  Objective:   Blood pressure 130/83, pulse 83, height  (1.702 m), weight 273 lb (123.8 kg), SpO2 100 %. Body mass index is 42.76 kg/m.  Lab Results  Component Value Date   CREATININE 1.31 (H) 01/20/2022   BUN 20 01/20/2022   NA 137 01/20/2022   K 3.5 01/20/2022   CL 95 (L) 01/20/2022   CO2 24 01/20/2022   Lab Results  Component Value Date   ALT 7 01/20/2022   AST 12 01/20/2022   ALKPHOS 72 01/20/2022   BILITOT 0.4 01/20/2022   Lab Results  Component Value Date   HGBA1C 7.8 (H)  01/20/2022   HGBA1C 7.0 (H) 09/24/2021   HGBA1C 7.2 (H) 06/23/2021   HGBA1C 7.1 (H) 02/24/2021   HGBA1C 8.8 (H) 10/27/2020   Lab Results  Component Value Date   INSULIN 16.9 01/20/2022   INSULIN 29.5 (H) 09/24/2021   INSULIN 18.2 06/23/2021   INSULIN 11.1 02/24/2021   INSULIN 15.7 10/27/2020   Lab Results  Component Value Date   Suzanne Carey 2.960 09/24/2021   Lab Results  Component Value Date   CHOL 193 01/20/2022   HDL 30 (L) 01/20/2022   LDLCALC 126 (H) 01/20/2022   TRIG 208 (H) 01/20/2022   CHOLHDL 4.4 03/28/2019   Lab Results  Component Value Date   VD25OH 65.8 01/20/2022   VD25OH 68.2 09/24/2021   VD25OH 80.5 06/23/2021   Lab Results  Component Value Date   WBC 10.5 04/09/2022   HGB 11.7 (A) 04/09/2022   HCT 35 (A) 04/09/2022   MCV 86 01/20/2022   PLT 295 04/09/2022   No results found for: "IRON", "TIBC", "FERRITIN"  Attestation Statements:   Reviewed by clinician on day of visit: allergies, medications, problem list, medical history, surgical history, family history, social history, and previous encounter notes.   I, Burt Knack, am acting as transcriptionist for Quillian Quince, MD.  I have reviewed the above documentation for accuracy and completeness, and I agree with the above. -  Quillian Quince, MD

## 2022-05-27 LAB — HEMOGLOBIN A1C: Hgb A1c MFr Bld: 7.2 % — ABNORMAL HIGH (ref 4.8–5.6)

## 2022-05-27 LAB — VITAMIN D 25 HYDROXY (VIT D DEFICIENCY, FRACTURES): Vit D, 25-Hydroxy: 79.7 ng/mL (ref 30.0–100.0)

## 2022-05-27 LAB — CMP14+EGFR
Alkaline Phosphatase: 62 IU/L (ref 44–121)
Bilirubin Total: 0.3 mg/dL (ref 0.0–1.2)
CO2: 22 mmol/L (ref 20–29)
Glucose: 109 mg/dL — ABNORMAL HIGH (ref 70–99)

## 2022-05-27 LAB — CBC WITH DIFFERENTIAL/PLATELET
Basophils Absolute: 0 10*3/uL (ref 0.0–0.2)
Basos: 0 %
Hemoglobin: 11.6 g/dL (ref 11.1–15.9)
Lymphocytes Absolute: 1.9 10*3/uL (ref 0.7–3.1)
MCV: 88 fL (ref 79–97)
Monocytes: 7 %
Neutrophils Absolute: 7.6 10*3/uL — ABNORMAL HIGH (ref 1.4–7.0)

## 2022-05-27 LAB — LIPID PANEL WITH LDL/HDL RATIO
HDL: 33 mg/dL — ABNORMAL LOW (ref >39)
LDL/HDL Ratio: 3.7 ratio — ABNORMAL HIGH (ref 0.0–3.2)

## 2022-05-27 LAB — INSULIN, RANDOM

## 2022-05-28 LAB — LIPID PANEL WITH LDL/HDL RATIO
Cholesterol, Total: 187 mg/dL (ref 100–199)
LDL Chol Calc (NIH): 122 mg/dL — ABNORMAL HIGH (ref 0–99)
Triglycerides: 177 mg/dL — ABNORMAL HIGH (ref 0–149)
VLDL Cholesterol Cal: 32 mg/dL (ref 5–40)

## 2022-05-28 LAB — CMP14+EGFR
ALT: 12 IU/L (ref 0–32)
AST: 15 IU/L (ref 0–40)
Albumin/Globulin Ratio: 1.2 (ref 1.2–2.2)
Albumin: 3.9 g/dL (ref 3.9–4.9)
BUN/Creatinine Ratio: 18 (ref 12–28)
BUN: 22 mg/dL (ref 8–27)
Calcium: 9.6 mg/dL (ref 8.7–10.3)
Chloride: 99 mmol/L (ref 96–106)
Creatinine, Ser: 1.22 mg/dL — ABNORMAL HIGH (ref 0.57–1.00)
Globulin, Total: 3.2 g/dL (ref 1.5–4.5)
Potassium: 3.8 mmol/L (ref 3.5–5.2)
Sodium: 139 mmol/L (ref 134–144)
Total Protein: 7.1 g/dL (ref 6.0–8.5)
eGFR: 50 mL/min/{1.73_m2} — ABNORMAL LOW (ref >59)

## 2022-05-28 LAB — CBC WITH DIFFERENTIAL/PLATELET
EOS (ABSOLUTE): 0.2 10*3/uL (ref 0.0–0.4)
Eos: 2 %
Hematocrit: 35.8 % (ref 34.0–46.6)
Immature Grans (Abs): 0.1 10*3/uL (ref 0.0–0.1)
Immature Granulocytes: 1 %
Lymphs: 18 %
MCH: 28.5 pg (ref 26.6–33.0)
MCHC: 32.4 g/dL (ref 31.5–35.7)
Monocytes Absolute: 0.8 10*3/uL (ref 0.1–0.9)
Neutrophils: 72 %
Platelets: 287 10*3/uL (ref 150–450)
RBC: 4.07 x10E6/uL (ref 3.77–5.28)
RDW: 13.4 % (ref 11.7–15.4)
WBC: 10.6 10*3/uL (ref 3.4–10.8)

## 2022-05-28 LAB — TSH: TSH: 3.47 u[IU]/mL (ref 0.450–4.500)

## 2022-05-28 LAB — VITAMIN B12: Vitamin B-12: 311 pg/mL (ref 232–1245)

## 2022-05-28 LAB — HEMOGLOBIN A1C: Est. average glucose Bld gHb Est-mCnc: 160 mg/dL

## 2022-06-24 ENCOUNTER — Ambulatory Visit (INDEPENDENT_AMBULATORY_CARE_PROVIDER_SITE_OTHER): Payer: BC Managed Care – PPO | Admitting: Family Medicine

## 2022-06-24 ENCOUNTER — Encounter (INDEPENDENT_AMBULATORY_CARE_PROVIDER_SITE_OTHER): Payer: Self-pay | Admitting: Family Medicine

## 2022-06-24 VITALS — BP 107/77 | HR 100 | Temp 97.8°F | Ht 67.0 in | Wt 277.0 lb

## 2022-06-24 DIAGNOSIS — Z6841 Body Mass Index (BMI) 40.0 and over, adult: Secondary | ICD-10-CM

## 2022-06-24 DIAGNOSIS — E559 Vitamin D deficiency, unspecified: Secondary | ICD-10-CM | POA: Diagnosis not present

## 2022-06-24 DIAGNOSIS — E1169 Type 2 diabetes mellitus with other specified complication: Secondary | ICD-10-CM | POA: Diagnosis not present

## 2022-06-24 DIAGNOSIS — E669 Obesity, unspecified: Secondary | ICD-10-CM

## 2022-06-24 DIAGNOSIS — E1129 Type 2 diabetes mellitus with other diabetic kidney complication: Secondary | ICD-10-CM

## 2022-06-28 NOTE — Progress Notes (Unsigned)
Chief Complaint:   OBESITY Suzanne Carey is here to discuss her progress with her obesity treatment plan along with follow-up of her obesity related diagnoses. Suzanne Carey is on the Category 3 Plan and states she is following her eating plan approximately 75% of the time. Suzanne Carey states she is walking for 10-15 minutes 3-4 times per week.  Today's visit was #: 25 Starting weight: 318 lbs Starting date: 07/22/2020 Today's weight: 277 lbs Today's date: 06/24/2022 Total lbs lost to date: 41 Total lbs lost since last in-office visit: 0  Interim History: Suzanne Carey has been gaining some of her weight back. She is now up 15 lbs this year from her lowest weight. She is working on getting back on track. She would like to discuss other eating plans.   Subjective:   1. 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 She will be discussing options with her PCP soon.  Her A1c is improving but it is still above goal.  I discussed labs with the patient today.  2. Vitamin D deficiency Suzanne Carey is on vitamin D, and her level is at goal.  She denies nausea, vomiting, or muscle weakness.  I discussed labs with the patient today.  Assessment/Plan:   1. 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  2. Vitamin D deficiency Suzanne Carey will continue vitamin D prescription, and we will continue to follow.  3. BMI 40.0-44.9, adult (HCC)  4. Obesity, Beginning BMI 49.81 Suzanne Carey is currently in the action stage of change. As such, her goal is to continue with weight loss efforts. She has agreed to change to keeping a food journal and adhering to recommended goals of 1400-1600 calories and 85+ grams of protein daily.   Exercise goals: As is.   Behavioral modification strategies: increasing lean protein  intake, decreasing simple carbohydrates, and increasing vegetables.  Suzanne Carey has agreed to follow-up with our clinic in 4 weeks. She was informed of the importance of frequent follow-up visits to maximize her success with intensive lifestyle modifications for her multiple health conditions.   Objective:   Blood pressure 107/77, pulse 100, temperature 97.8 F (36.6 C), height 5\' 7"  (1.702 m), weight 277 lb (125.6 kg), SpO2 96 %. Body mass index is 43.38 kg/m.  Lab Results  Component Value Date   CREATININE 1.22 (H) 05/26/2022   BUN 22 05/26/2022   NA 139 05/26/2022   K 3.8 05/26/2022   CL 99 05/26/2022   CO2 22 05/26/2022   Lab Results  Component Value Date   ALT 12 05/26/2022   AST 15 05/26/2022   ALKPHOS 62 05/26/2022   BILITOT 0.3 05/26/2022   Lab Results  Component Value Date   HGBA1C 7.2 (H) 05/26/2022   HGBA1C 7.8 (H) 01/20/2022   HGBA1C 7.0 (H) 09/24/2021   HGBA1C 7.2 (H) 06/23/2021   HGBA1C 7.1 (H) 02/24/2021   Lab Results  Component Value Date   INSULIN 18.2 05/26/2022   INSULIN 16.9 01/20/2022   INSULIN 29.5 (H) 09/24/2021   INSULIN 18.2 06/23/2021   INSULIN 11.1 02/24/2021   Lab Results  Component Value Date   TSH 3.470 05/26/2022   Lab Results  Component Value Date   CHOL 187 05/26/2022   HDL 33 (L) 05/26/2022   LDLCALC 122 (H) 05/26/2022   TRIG 177 (H)  05/26/2022   CHOLHDL 4.4 03/28/2019   Lab Results  Component Value Date   VD25OH 79.7 05/26/2022   VD25OH 65.8 01/20/2022   VD25OH 68.2 09/24/2021   Lab Results  Component Value Date   WBC 10.6 05/26/2022   HGB 11.6 05/26/2022   HCT 35.8 05/26/2022   MCV 88 05/26/2022   PLT 287 05/26/2022   No results found for: "IRON", "TIBC", "FERRITIN"  Attestation Statements:   Reviewed by clinician on day of visit: allergies, medications, problem list, medical history, surgical history, family history, social history, and previous encounter notes.  Time spent on visit including pre-visit chart  review and post-visit care and charting was 30 minutes.   I, Burt Knack, am acting as transcriptionist for Quillian Quince, MD.  I have reviewed the above documentation for accuracy and completeness, and I agree with the above. -  Quillian Quince, MD

## 2022-07-07 DIAGNOSIS — E1165 Type 2 diabetes mellitus with hyperglycemia: Secondary | ICD-10-CM | POA: Diagnosis not present

## 2022-07-07 DIAGNOSIS — E782 Mixed hyperlipidemia: Secondary | ICD-10-CM | POA: Diagnosis not present

## 2022-07-07 DIAGNOSIS — M150 Primary generalized (osteo)arthritis: Secondary | ICD-10-CM | POA: Diagnosis not present

## 2022-07-07 DIAGNOSIS — I10 Essential (primary) hypertension: Secondary | ICD-10-CM | POA: Diagnosis not present

## 2022-07-24 ENCOUNTER — Other Ambulatory Visit: Payer: Self-pay | Admitting: Cardiology

## 2022-07-27 ENCOUNTER — Ambulatory Visit (INDEPENDENT_AMBULATORY_CARE_PROVIDER_SITE_OTHER): Payer: BC Managed Care – PPO | Admitting: Family Medicine

## 2022-08-03 ENCOUNTER — Other Ambulatory Visit: Payer: Self-pay | Admitting: Cardiology

## 2022-08-03 ENCOUNTER — Telehealth: Payer: Self-pay | Admitting: Cardiology

## 2022-08-03 ENCOUNTER — Other Ambulatory Visit: Payer: Self-pay

## 2022-08-03 DIAGNOSIS — I480 Paroxysmal atrial fibrillation: Secondary | ICD-10-CM

## 2022-08-03 MED ORDER — APIXABAN 5 MG PO TABS
5.0000 mg | ORAL_TABLET | Freq: Two times a day (BID) | ORAL | 5 refills | Status: DC
Start: 2022-08-03 — End: 2023-09-07

## 2022-08-03 NOTE — Telephone Encounter (Signed)
Prescription refill request for Eliquis received. Indication: Afib  Last office visit: 05/12/22 [facility])  Scr: 1.22 (05/26/22)  Age: 62 Weight: 125.6kg Appropriate dose. Refill sent.

## 2022-08-03 NOTE — Telephone Encounter (Signed)
*  STAT* If patient is at the pharmacy, call can be transferred to refill team.   1. Which medications need to be refilled? (please list name of each medication and dose if known)  apixaban (ELIQUIS) 5 MG TABS tablet   2. Which pharmacy/location (including street and city if local pharmacy) is medication to be sent to?  [id number] - [place], [place] - [address]    3. Do they need a 30 day or 90 day supply? 30

## 2022-08-03 NOTE — Telephone Encounter (Signed)
Prescription refill request for Eliquis received. Indication:afib Last office visit:4/24 Scr:1.22  4/24 Age: 62 Weight:125.6  kg  Prescription refilled

## 2022-08-26 ENCOUNTER — Ambulatory Visit (INDEPENDENT_AMBULATORY_CARE_PROVIDER_SITE_OTHER): Payer: BC Managed Care – PPO | Admitting: Family Medicine

## 2022-08-26 VITALS — BP 105/68 | HR 106 | Ht 67.0 in | Wt 279.0 lb

## 2022-08-26 DIAGNOSIS — Z6841 Body Mass Index (BMI) 40.0 and over, adult: Secondary | ICD-10-CM

## 2022-08-26 DIAGNOSIS — E559 Vitamin D deficiency, unspecified: Secondary | ICD-10-CM | POA: Diagnosis not present

## 2022-08-26 DIAGNOSIS — E669 Obesity, unspecified: Secondary | ICD-10-CM

## 2022-08-26 DIAGNOSIS — R609 Edema, unspecified: Secondary | ICD-10-CM

## 2022-08-26 DIAGNOSIS — Z7985 Long-term (current) use of injectable non-insulin antidiabetic drugs: Secondary | ICD-10-CM

## 2022-08-26 DIAGNOSIS — E1169 Type 2 diabetes mellitus with other specified complication: Secondary | ICD-10-CM

## 2022-08-26 MED ORDER — ERGOCALCIFEROL 1.25 MG (50000 UT) PO CAPS: 50000.0000 [IU] | ORAL_CAPSULE | ORAL | 0 refills | Status: DC

## 2022-08-26 NOTE — Progress Notes (Signed)
.[facility]  Office: [phone]  /  Fax: [phone]  WEIGHT SUMMARY AND BIOMETRICS  Anthropometric Measurements Height: 5\' 7"  (1.702 m) Weight: 279 lb (126.6 kg) BMI (Calculated): 43.69 Weight at Last Visit: 277 lb Weight Lost Since Last Visit: 0 Weight Gained Since Last Visit: 2 lb Starting Weight: 318 lb Total Weight Loss (lbs): 39 lb (17.7 kg)   Body Composition  Body Fat %: 54.3 % Fat Mass (lbs): 152 lbs Muscle Mass (lbs): 121.4 lbs Visceral Fat Rating : 19   Other Clinical Data Fasting: No Labs: No Today's Visit #: 26 Starting Date: 07/22/20    Chief Complaint: OBESITY     History of Present Illness   The patient is a 62 year old individual with a history of obesity and diabetes, who presents for a routine follow-up. Over the past two months, the patient has gained two pounds despite adherence to a category three diet plan approximately 80% of the time. The patient reports engaging in physical activity, specifically walking, for about 10-15 minutes three times per week.  The patient's diabetes is managed with Glucotrol and Actos. Recently, the patient was restarted on Ozempic due to insurance issues, and is currently on a dose of 0.5 mg. The patient has been on this dose for approximately two weeks, following a four-week period on a 0.25 mg dose.  The patient has also been experiencing some fluid retention, as evidenced by foot swelling, which she attributes to the heat. She has been advised to take an extra fluid pill occasionally to manage this.  The patient's A1c has decreased from 7.8 to 7.2, with a goal of achieving a level under 7. The patient has been trying to manage her diet, focusing on protein intake and limiting bread consumption. She has been experimenting with different recipes to maintain interest in her diet plan.  The patient has not been regularly checking her blood sugars but has agreed to check her morning blood sugar two to three times per week.  The patient's blood pressure is reported to be well-controlled.          PHYSICAL EXAM:  Blood pressure 105/68, pulse (!) 106, height 5\' 7"  (1.702 m), weight 279 lb (126.6 kg), SpO2 95%. Body mass index is 43.7 kg/m.  DIAGNOSTIC DATA REVIEWED:  BMET    Component Value Date/Time   NA 139 05/26/2022 1135   K 3.8 05/26/2022 1135   CL 99 05/26/2022 1135   CO2 22 05/26/2022 1135   GLUCOSE 109 (H) 05/26/2022 1135   BUN 22 05/26/2022 1135   CREATININE 1.22 (H) 05/26/2022 1135   CALCIUM 9.6 05/26/2022 1135   GFRNONAA 36 (L) 04/03/2020 1351   GFRAA 41 (L) 04/03/2020 1351   Lab Results  Component Value Date   HGBA1C 7.2 (H) 05/26/2022   HGBA1C 8.3 (H) 07/22/2020   Lab Results  Component Value Date   INSULIN 18.2 05/26/2022   INSULIN 26.4 (H) 07/22/2020   Lab Results  Component Value Date   TSH 3.470 05/26/2022   CBC    Component Value Date/Time   WBC 10.6 05/26/2022 1135   RBC 4.07 05/26/2022 1135   RBC 4.02 04/09/2022 0000   HGB 11.6 05/26/2022 1135   HCT 35.8 05/26/2022 1135   PLT 287 05/26/2022 1135   MCV 88 05/26/2022 1135   MCH 28.5 05/26/2022 1135   MCHC 32.4 05/26/2022 1135   RDW 13.4 05/26/2022 1135   Iron Studies No results found for: "IRON", "TIBC", "FERRITIN", "IRONPCTSAT" Lipid Panel  Component Value Date/Time   CHOL 187 05/26/2022 1135   TRIG 177 (H) 05/26/2022 1135   HDL 33 (L) 05/26/2022 1135   CHOLHDL 4.4 03/28/2019 1136   LDLCALC 122 (H) 05/26/2022 1135   Hepatic Function Panel     Component Value Date/Time   PROT 7.1 05/26/2022 1135   ALBUMIN 3.9 05/26/2022 1135   AST 15 05/26/2022 1135   ALT 12 05/26/2022 1135   ALKPHOS 62 05/26/2022 1135   BILITOT 0.3 05/26/2022 1135      Component Value Date/Time   TSH 3.470 05/26/2022 1135   Nutritional Lab Results  Component Value Date   VD25OH 79.7 05/26/2022   VD25OH 65.8 01/20/2022   VD25OH 68.2 09/24/2021     Assessment and Plan    Obesity: Weight increased by 2 pounds over  the last two months. Patient is following the category three plan approximately 80% of the time and walking for 10-15 minutes three times per week. Discussed strategies to prevent boredom with the diet and to increase protein intake. -Continue category three plan or journaling with a goal of 1500 calories and 85g of protein daily. -Continue walking as tolerated.  Type 2 Diabetes Mellitus: Patient is on Glucotrol and Actos. Recently restarted Ozempic due to insurance issues and is currently on 0.5mg  weekly. A1c decreased from 7.8 to 7.2. -Continue Ozempic 0.5mg  weekly. -Check blood sugars occasionally, particularly fasting and 1-2 hours post dinner. -Plan to check A1c at next visit in August.  Vitamin D Deficiency: Patient is on Vitamin D 50,000 units twice weekly. -Continue Vitamin D 50,000 units every three days.  Edema: Noted foot swelling likely due to heat. Patient has been advised to take an extra diuretic as needed. -Consider taking an extra diuretic as needed for swelling.  Follow-up in August with fasting for labs.         I have personally spent 42 minutes total time today in preparation, patient care, and documentation for this visit, including the following: review of clinical lab tests; review of medical tests/procedures/services.    She was informed of the importance of frequent follow up visits to maximize her success with intensive lifestyle modifications for her multiple health conditions.    Quillian Quince, MD

## 2022-09-23 ENCOUNTER — Ambulatory Visit (INDEPENDENT_AMBULATORY_CARE_PROVIDER_SITE_OTHER): Payer: BC Managed Care – PPO | Admitting: Family Medicine

## 2022-09-23 ENCOUNTER — Encounter (INDEPENDENT_AMBULATORY_CARE_PROVIDER_SITE_OTHER): Payer: Self-pay | Admitting: Family Medicine

## 2022-09-23 VITALS — BP 100/66 | HR 97 | Temp 97.8°F | Ht 67.0 in | Wt 278.0 lb

## 2022-09-23 DIAGNOSIS — E1122 Type 2 diabetes mellitus with diabetic chronic kidney disease: Secondary | ICD-10-CM

## 2022-09-23 DIAGNOSIS — E785 Hyperlipidemia, unspecified: Secondary | ICD-10-CM | POA: Diagnosis not present

## 2022-09-23 DIAGNOSIS — Z7985 Long-term (current) use of injectable non-insulin antidiabetic drugs: Secondary | ICD-10-CM

## 2022-09-23 DIAGNOSIS — N1831 Chronic kidney disease, stage 3a: Secondary | ICD-10-CM | POA: Diagnosis not present

## 2022-09-23 DIAGNOSIS — E559 Vitamin D deficiency, unspecified: Secondary | ICD-10-CM

## 2022-09-23 DIAGNOSIS — E1169 Type 2 diabetes mellitus with other specified complication: Secondary | ICD-10-CM | POA: Diagnosis not present

## 2022-09-23 DIAGNOSIS — E669 Obesity, unspecified: Secondary | ICD-10-CM

## 2022-09-23 DIAGNOSIS — Z6841 Body Mass Index (BMI) 40.0 and over, adult: Secondary | ICD-10-CM

## 2022-09-23 MED ORDER — ERGOCALCIFEROL 1.25 MG (50000 UT) PO CAPS: 50000.0000 [IU] | ORAL_CAPSULE | ORAL | 0 refills | Status: DC

## 2022-09-24 LAB — HEMOGLOBIN A1C
Est. average glucose Bld gHb Est-mCnc: 148 mg/dL
Hgb A1c MFr Bld: 6.8 % — ABNORMAL HIGH (ref 4.8–5.6)

## 2022-09-24 LAB — LIPID PANEL WITH LDL/HDL RATIO
Cholesterol, Total: 178 mg/dL (ref 100–199)
HDL: 30 mg/dL — ABNORMAL LOW (ref >39)
LDL Chol Calc (NIH): 110 mg/dL — ABNORMAL HIGH (ref 0–99)
LDL/HDL Ratio: 3.7 ratio — ABNORMAL HIGH (ref 0.0–3.2)
Triglycerides: 218 mg/dL — ABNORMAL HIGH (ref 0–149)
VLDL Cholesterol Cal: 38 mg/dL (ref 5–40)

## 2022-09-24 LAB — CMP14+EGFR
ALT: 11 IU/L (ref 0–32)
AST: 16 IU/L (ref 0–40)
Albumin: 4 g/dL (ref 3.9–4.9)
Alkaline Phosphatase: 66 IU/L (ref 44–121)
BUN/Creatinine Ratio: 18 (ref 12–28)
BUN: 27 mg/dL (ref 8–27)
Bilirubin Total: 0.4 mg/dL (ref 0.0–1.2)
CO2: 25 mmol/L (ref 20–29)
Calcium: 9.6 mg/dL (ref 8.7–10.3)
Chloride: 98 mmol/L (ref 96–106)
Creatinine, Ser: 1.49 mg/dL — ABNORMAL HIGH (ref 0.57–1.00)
Globulin, Total: 3.1 g/dL (ref 1.5–4.5)
Glucose: 132 mg/dL — ABNORMAL HIGH (ref 70–99)
Potassium: 4.3 mmol/L (ref 3.5–5.2)
Sodium: 138 mmol/L (ref 134–144)
Total Protein: 7.1 g/dL (ref 6.0–8.5)
eGFR: 39 mL/min/{1.73_m2} — ABNORMAL LOW (ref >59)

## 2022-09-24 LAB — INSULIN, RANDOM: INSULIN: 36.1 u[IU]/mL — ABNORMAL HIGH (ref 2.6–24.9)

## 2022-09-24 LAB — VITAMIN D 25 HYDROXY (VIT D DEFICIENCY, FRACTURES): Vit D, 25-Hydroxy: 91.1 ng/mL (ref 30.0–100.0)

## 2022-09-24 LAB — VITAMIN B12: Vitamin B-12: 267 pg/mL (ref 232–1245)

## 2022-09-28 NOTE — Progress Notes (Signed)
Chief Complaint:   OBESITY Suzanne Carey is here to discuss her progress with her obesity treatment plan along with follow-up of her obesity related diagnoses. Suzanne Carey is on the Category 3 Plan and states she is following her eating plan approximately 70-75% of the time. Suzanne Carey states she is walking for 10-15 minutes 3 times per week.  Today's visit was #: 27 Starting weight: 318 lbs Starting date: 07/22/2020 Today's weight: 278 lbs Today's date: 09/23/2022 Total lbs lost to date: 40 Total lbs lost since last in-office visit: 1  Interim History: Patient has done better with her weight loss.  She has gained some weight this year from her lowest weight in January of 259 pounds.  She is working on following her category 3 plan more closely.  Subjective:   1. Vitamin D deficiency Patient is on vitamin D twice weekly, and her last level was at goal.  2. Hyperlipidemia associated with type 2 diabetes mellitus (HCC) Patient is working on decreasing cholesterol in her diet, and she is due for labs.  3. Type 2 diabetes mellitus with stage 3a chronic kidney disease, unspecified whether long term insulin use (HCC) Patient restarted Ozempic per her PCP.  Her last A1c was not at goal.  She is doing well with Ozempic, with no nausea or vomiting.  Assessment/Plan:   1. Vitamin D deficiency We will check labs today, and we will refill prescription vitamin D for 1 month.  Patient is to stop all vitamin D if her level is 100 or more.  - VITAMIN D 25 Hydroxy (Vit-D Deficiency, Fractures) - ergocalciferol (VITAMIN D2) 1.25 MG (50000 UT) capsule; Take 1 capsule (50,000 Units total) by mouth 2 (two) times a week.  Dispense: 9 capsule; Refill: 0  2. Hyperlipidemia associated with type 2 diabetes mellitus (HCC) We will check labs today.  Patient will continue with her diet and exercise, and we will follow-up at her next visit.  - Lipid Panel With LDL/HDL Ratio  3. Type 2 diabetes mellitus with stage 3a  chronic kidney disease, unspecified whether long term insulin use (HCC) We will check labs today.  Patient will continue with her diet and exercise, and we will follow-up at her next visit.  - Vitamin B12 - CMP14+EGFR - Insulin, random - Hemoglobin A1c  4. BMI 40.0-44.9, adult (HCC)  5. Obesity, Beginning BMI 49.81 Suzanne Carey is currently in the action stage of change. As such, her goal is to continue with weight loss efforts. She has agreed to the Category 3 Plan.   Exercise goals: As is.   Behavioral modification strategies: increasing lean protein intake and meal planning and cooking strategies.  Suzanne Carey has agreed to follow-up with our clinic in 4 weeks. She was informed of the importance of frequent follow-up visits to maximize her success with intensive lifestyle modifications for her multiple health conditions.   Suzanne Carey was informed we would discuss her lab results at her next visit unless there is a critical issue that needs to be addressed sooner. Suzanne Carey agreed to keep her next visit at the agreed upon time to discuss these results.  Objective:   Blood pressure 100/66, pulse 97, temperature 97.8 F (36.6 C), height 5\' 7"  (1.702 m), weight 278 lb (126.1 kg), SpO2 97%. Body mass index is 43.54 kg/m.  Lab Results  Component Value Date   CREATININE 1.49 (H) 09/23/2022   BUN 27 09/23/2022   NA 138 09/23/2022   K 4.3 09/23/2022   CL 98 09/23/2022  CO2 25 09/23/2022   Lab Results  Component Value Date   ALT 11 09/23/2022   AST 16 09/23/2022   ALKPHOS 66 09/23/2022   BILITOT 0.4 09/23/2022   Lab Results  Component Value Date   HGBA1C 6.8 (H) 09/23/2022   HGBA1C 7.2 (H) 05/26/2022   HGBA1C 7.8 (H) 01/20/2022   HGBA1C 7.0 (H) 09/24/2021   HGBA1C 7.2 (H) 06/23/2021   Lab Results  Component Value Date   INSULIN 36.1 (H) 09/23/2022   INSULIN 18.2 05/26/2022   INSULIN 16.9 01/20/2022   INSULIN 29.5 (H) 09/24/2021   INSULIN 18.2 06/23/2021   Lab Results  Component Value  Date   TSH 3.470 05/26/2022   Lab Results  Component Value Date   CHOL 178 09/23/2022   HDL 30 (L) 09/23/2022   LDLCALC 110 (H) 09/23/2022   TRIG 218 (H) 09/23/2022   CHOLHDL 4.4 03/28/2019   Lab Results  Component Value Date   VD25OH 91.1 09/23/2022   VD25OH 79.7 05/26/2022   VD25OH 65.8 01/20/2022   Lab Results  Component Value Date   WBC 10.6 05/26/2022   HGB 11.6 05/26/2022   HCT 35.8 05/26/2022   MCV 88 05/26/2022   PLT 287 05/26/2022   No results found for: "IRON", "TIBC", "FERRITIN"  Attestation Statements:   Reviewed by clinician on day of visit: allergies, medications, problem list, medical history, surgical history, family history, social history, and previous encounter notes.   I, Burt Knack, am acting as transcriptionist for Quillian Quince, MD.  I have reviewed the above documentation for accuracy and completeness, and I agree with the above. -  Quillian Quince, MD

## 2022-10-06 DIAGNOSIS — M150 Primary generalized (osteo)arthritis: Secondary | ICD-10-CM | POA: Diagnosis not present

## 2022-10-06 DIAGNOSIS — E1165 Type 2 diabetes mellitus with hyperglycemia: Secondary | ICD-10-CM | POA: Diagnosis not present

## 2022-10-06 DIAGNOSIS — E782 Mixed hyperlipidemia: Secondary | ICD-10-CM | POA: Diagnosis not present

## 2022-10-06 DIAGNOSIS — I10 Essential (primary) hypertension: Secondary | ICD-10-CM | POA: Diagnosis not present

## 2022-10-15 ENCOUNTER — Other Ambulatory Visit: Payer: Self-pay | Admitting: Cardiology

## 2022-10-21 ENCOUNTER — Other Ambulatory Visit: Payer: Self-pay | Admitting: Cardiology

## 2022-10-21 ENCOUNTER — Encounter (INDEPENDENT_AMBULATORY_CARE_PROVIDER_SITE_OTHER): Payer: Self-pay | Admitting: Family Medicine

## 2022-10-21 ENCOUNTER — Ambulatory Visit (INDEPENDENT_AMBULATORY_CARE_PROVIDER_SITE_OTHER): Payer: BC Managed Care – PPO | Admitting: Family Medicine

## 2022-10-21 VITALS — BP 93/64 | HR 100 | Temp 97.6°F | Ht 67.0 in | Wt 280.0 lb

## 2022-10-21 DIAGNOSIS — Z794 Long term (current) use of insulin: Secondary | ICD-10-CM

## 2022-10-21 DIAGNOSIS — E1169 Type 2 diabetes mellitus with other specified complication: Secondary | ICD-10-CM

## 2022-10-21 DIAGNOSIS — E669 Obesity, unspecified: Secondary | ICD-10-CM | POA: Diagnosis not present

## 2022-10-21 DIAGNOSIS — I10 Essential (primary) hypertension: Secondary | ICD-10-CM

## 2022-10-21 DIAGNOSIS — Z7985 Long-term (current) use of injectable non-insulin antidiabetic drugs: Secondary | ICD-10-CM

## 2022-10-21 DIAGNOSIS — Z7984 Long term (current) use of oral hypoglycemic drugs: Secondary | ICD-10-CM

## 2022-10-21 DIAGNOSIS — Z6841 Body Mass Index (BMI) 40.0 and over, adult: Secondary | ICD-10-CM | POA: Diagnosis not present

## 2022-10-27 NOTE — Progress Notes (Unsigned)
Chief Complaint:   OBESITY Suzanne Carey is here to discuss her progress with her obesity treatment plan along with follow-up of her obesity related diagnoses. Suzanne Carey is on the Category 3 Plan and states she is following her eating plan approximately 75-80% of the time. Suzanne Carey states she is walking for 10-15 minutes 3 times per week.  Today's visit was #: 28 Starting weight: 318 lbs Starting date: 07/22/2020 Today's weight: 280 lbs Today's date: 10/21/2022 Total lbs lost to date: 38 Total lbs lost since last in-office visit: 0  Interim History: Patient is doing well with her diet, and her hunger is controlled.  She is working on her diet and exercise.  Subjective:   1. Type 2 diabetes mellitus with other specified complication, unspecified whether long term insulin use (HCC) Patient is on Actos, Glucotrol, and Ozempic.  She is working on her diet and exercise and her A1c is now below 7.0 (6.8) for the first time.   2. Essential hypertension Patient's blood pressure is well-controlled, and it is lower than normal but with no signs of hypotension.  Assessment/Plan:   1. Type 2 diabetes mellitus with other specified complication, unspecified whether long term insulin use (HCC) Patient will continue with her diet and medications, and she will reassess her insurance this fall to not have such a high deductible.  2. Essential hypertension Patient will continue her medications, diet, and exercise, and we will watch for signs of her medications needing to be lowered.  3. BMI 40.0-44.9, adult (HCC)  4. Obesity, Beginning BMI 49.81 Suzanne Carey is currently in the action stage of change. As such, her goal is to continue with weight loss efforts. She has agreed to the Category 3 Plan.   Exercise goals: As is.   Behavioral modification strategies: increasing lean protein intake and meal planning and cooking strategies.  Suzanne Carey has agreed to follow-up with our clinic in 4 weeks. She was informed of  the importance of frequent follow-up visits to maximize her success with intensive lifestyle modifications for her multiple health conditions.   Objective:   Blood pressure 93/64, pulse 100, temperature 97.6 F (36.4 C), height 5\' 7"  (1.702 m), weight 280 lb (127 kg), SpO2 99%. Body mass index is 43.85 kg/m.  Lab Results  Component Value Date   CREATININE 1.49 (H) 09/23/2022   BUN 27 09/23/2022   NA 138 09/23/2022   K 4.3 09/23/2022   CL 98 09/23/2022   CO2 25 09/23/2022   Lab Results  Component Value Date   ALT 11 09/23/2022   AST 16 09/23/2022   ALKPHOS 66 09/23/2022   BILITOT 0.4 09/23/2022   Lab Results  Component Value Date   HGBA1C 6.8 (H) 09/23/2022   HGBA1C 7.2 (H) 05/26/2022   HGBA1C 7.8 (H) 01/20/2022   HGBA1C 7.0 (H) 09/24/2021   HGBA1C 7.2 (H) 06/23/2021   Lab Results  Component Value Date   INSULIN 36.1 (H) 09/23/2022   INSULIN 18.2 05/26/2022   INSULIN 16.9 01/20/2022   INSULIN 29.5 (H) 09/24/2021   INSULIN 18.2 06/23/2021   Lab Results  Component Value Date   TSH 3.470 05/26/2022   Lab Results  Component Value Date   CHOL 178 09/23/2022   HDL 30 (L) 09/23/2022   LDLCALC 110 (H) 09/23/2022   TRIG 218 (H) 09/23/2022   CHOLHDL 4.4 03/28/2019   Lab Results  Component Value Date   VD25OH 91.1 09/23/2022   VD25OH 79.7 05/26/2022   VD25OH 65.8 01/20/2022   Lab  Results  Component Value Date   WBC 10.6 05/26/2022   HGB 11.6 05/26/2022   HCT 35.8 05/26/2022   MCV 88 05/26/2022   PLT 287 05/26/2022   No results found for: "IRON", "TIBC", "FERRITIN"  Attestation Statements:   Reviewed by clinician on day of visit: allergies, medications, problem list, medical history, surgical history, family history, social history, and previous encounter notes.  Time spent on visit including pre-visit chart review and post-visit care and charting was 30 minutes.   I, Burt Knack, am acting as transcriptionist for Quillian Quince, MD.  I have reviewed  the above documentation for accuracy and completeness, and I agree with the above. -  Quillian Quince, MD

## 2022-11-18 ENCOUNTER — Other Ambulatory Visit (INDEPENDENT_AMBULATORY_CARE_PROVIDER_SITE_OTHER): Payer: Self-pay | Admitting: Family Medicine

## 2022-11-18 ENCOUNTER — Ambulatory Visit (INDEPENDENT_AMBULATORY_CARE_PROVIDER_SITE_OTHER): Payer: BC Managed Care – PPO | Admitting: Family Medicine

## 2022-11-18 DIAGNOSIS — E559 Vitamin D deficiency, unspecified: Secondary | ICD-10-CM

## 2022-11-25 ENCOUNTER — Ambulatory Visit (INDEPENDENT_AMBULATORY_CARE_PROVIDER_SITE_OTHER): Payer: BC Managed Care – PPO | Admitting: Family Medicine

## 2022-11-25 ENCOUNTER — Encounter (INDEPENDENT_AMBULATORY_CARE_PROVIDER_SITE_OTHER): Payer: Self-pay | Admitting: Family Medicine

## 2022-11-25 VITALS — BP 111/70 | HR 93 | Temp 97.8°F | Ht 67.0 in | Wt 283.0 lb

## 2022-11-25 DIAGNOSIS — Z6841 Body Mass Index (BMI) 40.0 and over, adult: Secondary | ICD-10-CM | POA: Diagnosis not present

## 2022-11-25 DIAGNOSIS — M79601 Pain in right arm: Secondary | ICD-10-CM

## 2022-11-25 DIAGNOSIS — E669 Obesity, unspecified: Secondary | ICD-10-CM

## 2022-11-25 DIAGNOSIS — M549 Dorsalgia, unspecified: Secondary | ICD-10-CM | POA: Diagnosis not present

## 2022-11-25 NOTE — Progress Notes (Signed)
Chief Complaint:   OBESITY Suzanne Carey is here to discuss her progress with her obesity treatment plan along with follow-up of her obesity related diagnoses. [name] is on the Category 3 Plan and states she is following her eating plan approximately 65% of the time. [name] states she is walking for 30 minutes 7 times per week.  Today's visit was #: 29 Starting weight: 318 lbs Starting date: 07/22/2020 Today's weight: 282 lbs Today's date: 11/25/2022 Total lbs lost to date: 36 Total lbs lost since last in-office visit: 0  Interim History: Patient is unable to follow her plan as well due to working very long hours and meal planning has been difficult.  She is skipping some meals and increasing eating out.  Subjective:   1. Right-sided back pain, unspecified back location, unspecified chronicity Patient has had swelling for at least 3 months and right mid back pain also for 2 to 3 months with no trauma or falls.  She notes some pain relief with stretching.  2. Pain of right upper extremity Patient has had swelling for at least 3 months and right mid back pain also for 2 to 3 months with no trauma or falls.  She notes some pain relief with stretching.  Assessment/Plan:   1. Right-sided back pain, unspecified back location, unspecified chronicity Referral was sent to Dr. Denyse [facility]ass of [facility].  We will follow-up at patient's next visit.  - Ambulatory referral to Sports Medicine  2. Pain of right upper extremity Referral was sent to Dr. Denyse Amass of [facility].  We will follow-up at patient's next visit.  - Ambulatory referral to Sports Medicine  3. BMI 40.0-44.9, adult (HCC)  4. Obesity, Beginning BMI 49.81 Suzanne Carey is currently in the action stage of change. As such, her goal is to continue with weight loss efforts. She has agreed to the Category 3 Plan.   Easy high-protein slow cooker recipes were discussed and handouts were given.  Exercise goals: As is.   Behavioral  modification strategies: no skipping meals and meal planning and cooking strategies.  Suzanne Carey has agreed to follow-up with our clinic in 4 weeks. She was informed of the importance of frequent follow-up visits to maximize her success with intensive lifestyle modifications for her multiple health conditions.   Objective:   Blood pressure 111/70, pulse 93, temperature 97.8 F (36.6 C), height 5\' 7"  (1.702 m), weight 283 lb (128.4 kg), SpO2 98%. Body mass index is 44.32 kg/m.  Lab Results  Component Value Date   CREATININE 1.49 (H) 09/23/2022   BUN 27 09/23/2022   NA 138 09/23/2022   K 4.3 09/23/2022   CL 98 09/23/2022   CO2 25 09/23/2022   Lab Results  Component Value Date   ALT 11 09/23/2022   AST 16 09/23/2022   ALKPHOS 66 09/23/2022   BILITOT 0.4 09/23/2022   Lab Results  Component Value Date   HGBA1C 6.8 (H) 09/23/2022   HGBA1C 7.2 (H) 05/26/2022   HGBA1C 7.8 (H) 01/20/2022   HGBA1C 7.0 (H) 09/24/2021   HGBA1C 7.2 (H) 06/23/2021   Lab Results  Component Value Date   INSULIN 36.1 (H) 09/23/2022   INSULIN 18.2 05/26/2022   INSULIN 16.9 01/20/2022   INSULIN 29.5 (H) 09/24/2021   INSULIN 18.2 06/23/2021   Lab Results  Component Value Date   TSH 3.470 05/26/2022   Lab Results  Component Value Date   CHOL 178 09/23/2022   HDL 30 (L) 09/23/2022   LDLCALC 110 (H) 09/23/2022  TRIG 218 (H) 09/23/2022   CHOLHDL 4.4 03/28/2019   Lab Results  Component Value Date   VD25OH 91.1 09/23/2022   VD25OH 79.7 05/26/2022   VD25OH 65.8 01/20/2022   Lab Results  Component Value Date   WBC 10.6 05/26/2022   HGB 11.6 05/26/2022   HCT 35.8 05/26/2022   MCV 88 05/26/2022   PLT 287 05/26/2022   No results found for: "IRON", "TIBC", "FERRITIN"  Attestation Statements:   Reviewed by clinician on day of visit: allergies, medications, problem list, medical history, surgical history, family history, social history, and previous encounter notes.  Time spent on visit including  pre-visit chart review and post-visit care and charting was 30 minutes.   I, Burt Knack, am acting as transcriptionist for Quillian Quince, MD.  I have reviewed the above documentation for accuracy and completeness, and I agree with the above. -  Quillian Quince, MD

## 2022-12-01 ENCOUNTER — Other Ambulatory Visit: Payer: Self-pay

## 2022-12-01 ENCOUNTER — Ambulatory Visit: Payer: BC Managed Care – PPO | Admitting: Family Medicine

## 2022-12-01 ENCOUNTER — Ambulatory Visit (INDEPENDENT_AMBULATORY_CARE_PROVIDER_SITE_OTHER): Payer: BC Managed Care – PPO

## 2022-12-01 VITALS — BP 132/84 | HR 101 | Ht 67.0 in | Wt 291.0 lb

## 2022-12-01 DIAGNOSIS — M79621 Pain in right upper arm: Secondary | ICD-10-CM | POA: Diagnosis not present

## 2022-12-01 DIAGNOSIS — R0989 Other specified symptoms and signs involving the circulatory and respiratory systems: Secondary | ICD-10-CM | POA: Diagnosis not present

## 2022-12-01 DIAGNOSIS — I517 Cardiomegaly: Secondary | ICD-10-CM | POA: Diagnosis not present

## 2022-12-01 DIAGNOSIS — R109 Unspecified abdominal pain: Secondary | ICD-10-CM

## 2022-12-01 DIAGNOSIS — R0789 Other chest pain: Secondary | ICD-10-CM | POA: Diagnosis not present

## 2022-12-01 DIAGNOSIS — I700 Atherosclerosis of aorta: Secondary | ICD-10-CM | POA: Diagnosis not present

## 2022-12-01 NOTE — Progress Notes (Signed)
Suzanne Payor, Suzanne Carey, Suzanne Carey, Suzanne Carey acting as a scribe for Suzanne Graham, Suzanne Carey.  Suzanne Carey is a 62 y.o. female who presents to [facility] today for back pain 2-3 months. No injury. Pt locates pain to R-side of her upper lumbar spine to just above her bra strap line. She notes a "knot" present just about her bra strap. She also notes a fairly large lump  on the bicep of her R upper arm.  She had some pain located around the lump in the anterior upper arm.  This is worse with elbow flexion.  She denies any injury.  Radiating pain: no LE numbness/tingling: no LE weakness: no Aggravates: TTP Treatments tried: cream  Pertinent review of systems: No fevers or chills  Relevant historical information: Family history different types of cancer.   Exam:  BP 132/84   Pulse (!) 101   Ht 5\' 7"  (1.702 m)   Wt 291 lb (132 kg)   SpO2 97%   BMI 45.58 kg/m  General: Well Developed, well nourished, and in no acute distress.   MSK: Right upper arm prominent appearance of subcutaneous fat located in the anterior upper arm.  This is bigger than similar appearance on the left. Tender palpation distal biceps muscle tendinous junction right upper arm. Some pain is present with resisted elbow flexion and hand supination. Strength is intact.  Shoulder and elbow motion are intact.  Right thoracic back tender palpation rhomboid and inferior periscapular musculature.  Normal shoulder motion.    Lab and Radiology Results  Diagnostic Limited MSK Ultrasound of: Right upper arm Normal appearance of subcutaneous fat overlying biceps muscle.  No calcifications or increased vascular activity associated with the subcutaneous nodule. Distal biceps muscle and muscle tendinous junction are normal-appearing Impression: Possible lipoma  X-ray images chest x-ray and right humerus obtained today personally and independently interpreted  Chest x-ray: No acute fractures.  No  infiltrates.  No aggressive appearing bony lesions.  Right humerus: No aggressive appearing bony or soft tissue lesions.  Await formal radiology review   Assessment and Plan: 62 y.o. female with right upper arm pain and swelling.  I think it is possible she has a lipoma overlying biceps tendinitis.  This may be just how her subcutaneous tissue was distributed as well.  Plan for trial of physical therapy.  Will obtain ultrasound and x-ray of her arm to rule out malignancy etc.  If not improving consider MRI to evaluate for lipoma more accurately.  Right upper back periscapular pain thought to be muscle dysfunction periscapular musculature.  Plan for physical therapy.    Recheck in 6 weeks.   PDMP not reviewed this encounter. Orders Placed This Encounter  Procedures   [place] LIMITED JOINT SPACE STRUCTURES UP RIGHT(NO LINKED CHARGES)    Order Specific Question:   Reason for Exam (SYMPTOM  OR DIAGNOSIS REQUIRED)    Answer:   right upper arm pain    Order Specific Question:   Preferred imaging location?    Answer:   [facility] Chest 2 View    Standing Status:   Future    Standing Expiration Date:   12/01/2023    Order Specific Question:   Reason for Exam (SYMPTOM  OR DIAGNOSIS REQUIRED)    Answer:   flank pain    Order Specific Question:   Preferred imaging location?    Answer:   [facility]   DG Humerus Right  Standing Status:   Future    Standing Expiration Date:   01/01/2023    Order Specific Question:   Reason for Exam (SYMPTOM  OR DIAGNOSIS REQUIRED)    Answer:   right upper arm pain    Order Specific Question:   Preferred imaging location?    Answer:   [facility]   Ambulatory referral to Physical Therapy    Referral Priority:   Routine    Referral Type:   Physical Medicine    Referral Reason:   Specialty Services Required    Requested Specialty:   Physical Therapy    Number of Visits Requested:   1   No orders of the defined  types were placed in this encounter.    Discussed warning signs or symptoms. Please see discharge instructions. Patient expresses understanding.   The above documentation has been reviewed and is accurate and complete Suzanne Carey, M.D.

## 2022-12-01 NOTE — Patient Instructions (Addendum)
Thank you for coming in today.   Please get an Xray today before you leave   Try using a heating pad  I've referred you to Physical Therapy.  Let us know if you don't hear from them in one week.   Check back in 6 weeks

## 2022-12-22 ENCOUNTER — Ambulatory Visit (INDEPENDENT_AMBULATORY_CARE_PROVIDER_SITE_OTHER): Payer: BC Managed Care – PPO | Admitting: Family Medicine

## 2022-12-22 ENCOUNTER — Encounter (INDEPENDENT_AMBULATORY_CARE_PROVIDER_SITE_OTHER): Payer: Self-pay | Admitting: Family Medicine

## 2022-12-22 VITALS — BP 131/61 | HR 68 | Temp 97.5°F | Ht 67.0 in | Wt 286.0 lb

## 2022-12-22 DIAGNOSIS — N1831 Chronic kidney disease, stage 3a: Secondary | ICD-10-CM | POA: Diagnosis not present

## 2022-12-22 DIAGNOSIS — I5030 Unspecified diastolic (congestive) heart failure: Secondary | ICD-10-CM | POA: Diagnosis not present

## 2022-12-22 DIAGNOSIS — E1122 Type 2 diabetes mellitus with diabetic chronic kidney disease: Secondary | ICD-10-CM | POA: Diagnosis not present

## 2022-12-22 DIAGNOSIS — E1169 Type 2 diabetes mellitus with other specified complication: Secondary | ICD-10-CM | POA: Diagnosis not present

## 2022-12-22 DIAGNOSIS — I509 Heart failure, unspecified: Secondary | ICD-10-CM

## 2022-12-22 DIAGNOSIS — Z6841 Body Mass Index (BMI) 40.0 and over, adult: Secondary | ICD-10-CM

## 2022-12-22 DIAGNOSIS — E559 Vitamin D deficiency, unspecified: Secondary | ICD-10-CM | POA: Diagnosis not present

## 2022-12-22 DIAGNOSIS — E785 Hyperlipidemia, unspecified: Secondary | ICD-10-CM

## 2022-12-22 DIAGNOSIS — Z7984 Long term (current) use of oral hypoglycemic drugs: Secondary | ICD-10-CM

## 2022-12-22 DIAGNOSIS — E669 Obesity, unspecified: Secondary | ICD-10-CM

## 2022-12-22 HISTORY — DX: Heart failure, unspecified: I50.9

## 2022-12-22 MED ORDER — ERGOCALCIFEROL 1.25 MG (50000 UT) PO CAPS: 50000.0000 [IU] | ORAL_CAPSULE | ORAL | 0 refills | Status: AC

## 2022-12-22 NOTE — Progress Notes (Signed)
.smr  Office: [phone]  /  Fax: [phone]  WEIGHT SUMMARY AND BIOMETRICS  Anthropometric Measurements Height: 5\' 7"  (1.702 m) Weight: 286 lb (129.7 kg) BMI (Calculated): 44.78 Weight at Last Visit: 283 lb Weight Lost Since Last Visit: 0 Weight Gained Since Last Visit: 3 lb Starting Weight: 318 lb Total Weight Loss (lbs): 32 lb (14.5 kg) Peak Weight: 360 lb   Body Composition  Body Fat %: 55.8 % Fat Mass (lbs): 160 lbs Muscle Mass (lbs): 120.4 lbs Visceral Fat Rating : 20   Other Clinical Data Fasting: Yes Labs: Yes Today's Visit #: 30 Starting Date: 07/22/20    Chief Complaint: OBESITY  History of Present Illness   The patient, with a history of type 2 diabetes, renal insufficiency, vitamin D deficiency, hyperlipidemia, and obesity, presents for a routine follow-up. She reports adherence to her prescribed regimen of Actos, glipizide, and vitamin D supplementation, along with diet and exercise. Despite efforts to lose weight, she has gained three pounds in the past month.  Recently, the patient has noticed increased fluid retention, particularly in the legs, and has experienced shortness of breath when lying down at night. These symptoms have been present for approximately a week. She is currently on Lasix 40mg  and hydrochlorothiazide, taken daily.  In the past, the patient experienced significant fluid retention and weight gain after being taken off hydrochlorothiazide, which was previously combined with her blood pressure medication. She also recalls a similar episode of fluid retention and shortness of breath when she was prescribed Lipitor, which led to significant weight gain and leg weeping.  The patient is also on Ozempic for her diabetes, but has been unable to take it due to insurance issues. She is due to change insurance plans in December, which may allow her to resume this medication.          PHYSICAL EXAM:  Blood pressure 131/61, pulse 68,  temperature (!) 97.5 F (36.4 C), height 5\' 7"  (1.702 m), weight 286 lb (129.7 kg), SpO2 90%. Body mass index is 44.79 kg/m.  DIAGNOSTIC DATA REVIEWED:  BMET    Component Value Date/Time   NA 138 09/23/2022 1144   K 4.3 09/23/2022 1144   CL 98 09/23/2022 1144   CO2 25 09/23/2022 1144   GLUCOSE 132 (H) 09/23/2022 1144   BUN 27 09/23/2022 1144   CREATININE 1.49 (H) 09/23/2022 1144   CALCIUM 9.6 09/23/2022 1144   GFRNONAA 36 (L) 04/03/2020 1351   GFRAA 41 (L) 04/03/2020 1351   Lab Results  Component Value Date   HGBA1C 6.8 (H) 09/23/2022   HGBA1C 8.3 (H) 07/22/2020   Lab Results  Component Value Date   INSULIN 36.1 (H) 09/23/2022   INSULIN 26.4 (H) 07/22/2020   Lab Results  Component Value Date   TSH 3.470 05/26/2022   CBC    Component Value Date/Time   WBC 10.6 05/26/2022 1135   RBC 4.07 05/26/2022 1135   RBC 4.02 04/09/2022 0000   HGB 11.6 05/26/2022 1135   HCT 35.8 05/26/2022 1135   PLT 287 05/26/2022 1135   MCV 88 05/26/2022 1135   MCH 28.5 05/26/2022 1135   MCHC 32.4 05/26/2022 1135   RDW 13.4 05/26/2022 1135   Iron Studies No results found for: "IRON", "TIBC", "FERRITIN", "IRONPCTSAT" Lipid Panel     Component Value Date/Time   CHOL 178 09/23/2022 1144   TRIG 218 (H) 09/23/2022 1144   HDL 30 (L) 09/23/2022 1144   CHOLHDL 4.4 03/28/2019 1136   LDLCALC 110 (  H) 09/23/2022 1144   Hepatic Function Panel     Component Value Date/Time   PROT 7.1 09/23/2022 1144   ALBUMIN 4.0 09/23/2022 1144   AST 16 09/23/2022 1144   ALT 11 09/23/2022 1144   ALKPHOS 66 09/23/2022 1144   BILITOT 0.4 09/23/2022 1144      Component Value Date/Time   TSH 3.470 05/26/2022 1135   Nutritional Lab Results  Component Value Date   VD25OH 91.1 09/23/2022   VD25OH 79.7 05/26/2022   VD25OH 65.8 01/20/2022     Assessment and Plan    Diastolic Heart Failure Reports increased fluid retention in the legs and orthopnea for the past week. Currently on Lasix 40 mg and  hydrochlorothiazide. Prefers medication adjustments over additional interventions. Discussed risks of increased Lasix, including potential kidney strain, and benefits of improved fluid management. - Increase Lasix to 40 mg twice daily - Continue hydrochlorothiazide as prescribed - Inform Dr. 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 Due for lab work to monitor condition. Discussed benefits of maintaining current regimen and potential risks of medication adjustments given renal insufficiency. - Order A1c, insulin, and renal function tests - Continue current medications and lifestyle modifications  Obesity Gained three pounds in the last month despite following a category three eating plan 75% of the time and walking for 10-15 minutes three times per week. Discussed potential benefits of medications like Ozempic, Wegovy, and Mounjaro for weight management and heart health. Explained that Ozempic and Reginal Lutes are the same medication, while Greggory Keen has an additional component that may enhance effectiveness. Discussed insurance challenges and potential for obtaining samples. - Discuss with Dr. Karna Christmas about obtaining samples of Ozempic or other weight management medications - Continue current diet and exercise regimen  Hyperlipidemia Managing hyperlipidemia through diet and not currently on a statin due to previous adverse reactions. Discussed risks of statin therapy and benefits of dietary management. - Order cholesterol panel - Continue dietary modifications  Vitamin D Deficiency Currently on vitamin D 2000 IU daily. Close to being over-replaced at the last lab check and due for another check. Discussed importance of monitoring levels to avoid hypervitaminosis D. - Order vitamin D level  General Health Maintenance Due for routine lab work and has a follow-up appointment scheduled. Discussed  importance of regular monitoring for overall health maintenance. - Order CBC, B12 level, and urinalysis - Schedule follow-up appointment in January  Follow-up - Confirm follow-up appointment with Dr. Judith Blonder on December 29, 2022 - Schedule follow-up appointment in January.      I have personally spent 40 minutes total time today in preparation, patient care, and documentation for this visit, including the following: review of clinical lab tests; review of medical tests/procedures/services.  She was informed of the importance of frequent follow up visits to maximize her success with intensive lifestyle modifications for her multiple health conditions.    Quillian Quince, MD

## 2022-12-23 LAB — MICROALBUMIN / CREATININE URINE RATIO
Creatinine, Urine: 23.4 mg/dL
Microalb/Creat Ratio: <13 mg/g{creat} (ref 0–29)
Microalbumin, Urine: <3 ug/mL

## 2022-12-23 LAB — LIPID PANEL WITH LDL/HDL RATIO
Cholesterol, Total: 199 mg/dL (ref 100–199)
HDL: 30 mg/dL — ABNORMAL LOW (ref >39)
LDL Chol Calc (NIH): 127 mg/dL — ABNORMAL HIGH (ref 0–99)
LDL/HDL Ratio: 4.2 ratio — ABNORMAL HIGH (ref 0.0–3.2)
Triglycerides: 237 mg/dL — ABNORMAL HIGH (ref 0–149)
VLDL Cholesterol Cal: 42 mg/dL — ABNORMAL HIGH (ref 5–40)

## 2022-12-23 LAB — CBC WITH DIFFERENTIAL/PLATELET
Basophils Absolute: 0 10*3/uL (ref 0.0–0.2)
Basos: 0 %
EOS (ABSOLUTE): 0.1 10*3/uL (ref 0.0–0.4)
Eos: 1 %
Hematocrit: 35.7 % (ref 34.0–46.6)
Hemoglobin: 11.3 g/dL (ref 11.1–15.9)
Immature Grans (Abs): 0.2 10*3/uL — ABNORMAL HIGH (ref 0.0–0.1)
Immature Granulocytes: 1 %
Lymphocytes Absolute: 1.6 10*3/uL (ref 0.7–3.1)
Lymphs: 12 %
MCH: 28.5 pg (ref 26.6–33.0)
MCHC: 31.7 g/dL (ref 31.5–35.7)
MCV: 90 fL (ref 79–97)
Monocytes Absolute: 0.9 10*3/uL (ref 0.1–0.9)
Monocytes: 7 %
Neutrophils Absolute: 10.6 10*3/uL — ABNORMAL HIGH (ref 1.4–7.0)
Neutrophils: 79 %
Platelets: 339 10*3/uL (ref 150–450)
RBC: 3.96 x10E6/uL (ref 3.77–5.28)
RDW: 13.2 % (ref 11.7–15.4)
WBC: 13.4 10*3/uL — ABNORMAL HIGH (ref 3.4–10.8)

## 2022-12-23 LAB — CMP14+EGFR
ALT: 10 [IU]/L (ref 0–32)
AST: 14 [IU]/L (ref 0–40)
Albumin: 3.8 g/dL — ABNORMAL LOW (ref 3.9–4.9)
Alkaline Phosphatase: 67 [IU]/L (ref 44–121)
BUN/Creatinine Ratio: 12 (ref 12–28)
BUN: 16 mg/dL (ref 8–27)
Bilirubin Total: 0.4 mg/dL (ref 0.0–1.2)
CO2: 24 mmol/L (ref 20–29)
Calcium: 9.4 mg/dL (ref 8.7–10.3)
Chloride: 98 mmol/L (ref 96–106)
Creatinine, Ser: 1.32 mg/dL — ABNORMAL HIGH (ref 0.57–1.00)
Globulin, Total: 3.2 g/dL (ref 1.5–4.5)
Glucose: 165 mg/dL — ABNORMAL HIGH (ref 70–99)
Potassium: 3.8 mmol/L (ref 3.5–5.2)
Sodium: 140 mmol/L (ref 134–144)
Total Protein: 7 g/dL (ref 6.0–8.5)
eGFR: 45 mL/min/{1.73_m2} — ABNORMAL LOW (ref >59)

## 2022-12-23 LAB — VITAMIN D 25 HYDROXY (VIT D DEFICIENCY, FRACTURES): Vit D, 25-Hydroxy: 87.7 ng/mL (ref 30.0–100.0)

## 2022-12-23 LAB — INSULIN, RANDOM: INSULIN: 17.2 u[IU]/mL (ref 2.6–24.9)

## 2022-12-23 LAB — VITAMIN B12: Vitamin B-12: 273 pg/mL (ref 232–1245)

## 2022-12-23 LAB — HEMOGLOBIN A1C
Est. average glucose Bld gHb Est-mCnc: 169 mg/dL
Hgb A1c MFr Bld: 7.5 % — ABNORMAL HIGH (ref 4.8–5.6)

## 2022-12-27 NOTE — Progress Notes (Signed)
Right upper arm x-ray looks normal to radiology.

## 2022-12-27 NOTE — Progress Notes (Signed)
Chest x-ray shows a large heart.  The ribs look okay.

## 2023-01-12 ENCOUNTER — Ambulatory Visit: Payer: BC Managed Care – PPO | Admitting: Family Medicine

## 2023-01-19 DIAGNOSIS — E782 Mixed hyperlipidemia: Secondary | ICD-10-CM | POA: Diagnosis not present

## 2023-01-19 DIAGNOSIS — I10 Essential (primary) hypertension: Secondary | ICD-10-CM | POA: Diagnosis not present

## 2023-01-19 DIAGNOSIS — D519 Vitamin B12 deficiency anemia, unspecified: Secondary | ICD-10-CM | POA: Diagnosis not present

## 2023-01-19 DIAGNOSIS — Z79899 Other long term (current) drug therapy: Secondary | ICD-10-CM | POA: Diagnosis not present

## 2023-01-19 DIAGNOSIS — K219 Gastro-esophageal reflux disease without esophagitis: Secondary | ICD-10-CM | POA: Diagnosis not present

## 2023-01-19 DIAGNOSIS — E1165 Type 2 diabetes mellitus with hyperglycemia: Secondary | ICD-10-CM | POA: Diagnosis not present

## 2023-01-20 ENCOUNTER — Ambulatory Visit (INDEPENDENT_AMBULATORY_CARE_PROVIDER_SITE_OTHER): Payer: BC Managed Care – PPO | Admitting: Family Medicine

## 2023-01-25 ENCOUNTER — Other Ambulatory Visit: Payer: Self-pay | Admitting: Obstetrics and Gynecology

## 2023-01-25 DIAGNOSIS — Z0000 Encounter for general adult medical examination without abnormal findings: Secondary | ICD-10-CM

## 2023-01-26 ENCOUNTER — Other Ambulatory Visit: Payer: Self-pay | Admitting: Hematology and Oncology

## 2023-01-26 ENCOUNTER — Telehealth: Payer: Self-pay | Admitting: Hematology and Oncology

## 2023-01-26 ENCOUNTER — Inpatient Hospital Stay: Payer: BC Managed Care – PPO

## 2023-01-26 ENCOUNTER — Encounter: Payer: Self-pay | Admitting: Hematology and Oncology

## 2023-01-26 ENCOUNTER — Inpatient Hospital Stay: Payer: BC Managed Care – PPO | Attending: Hematology and Oncology | Admitting: Hematology and Oncology

## 2023-01-26 VITALS — BP 129/111 | HR 96 | Temp 98.0°F | Resp 18 | Ht 67.0 in | Wt 292.9 lb

## 2023-01-26 DIAGNOSIS — M109 Gout, unspecified: Secondary | ICD-10-CM | POA: Insufficient documentation

## 2023-01-26 DIAGNOSIS — G479 Sleep disorder, unspecified: Secondary | ICD-10-CM | POA: Diagnosis not present

## 2023-01-26 DIAGNOSIS — I272 Pulmonary hypertension, unspecified: Secondary | ICD-10-CM | POA: Diagnosis not present

## 2023-01-26 DIAGNOSIS — E039 Hypothyroidism, unspecified: Secondary | ICD-10-CM | POA: Insufficient documentation

## 2023-01-26 DIAGNOSIS — Z7901 Long term (current) use of anticoagulants: Secondary | ICD-10-CM | POA: Diagnosis not present

## 2023-01-26 DIAGNOSIS — R109 Unspecified abdominal pain: Secondary | ICD-10-CM | POA: Insufficient documentation

## 2023-01-26 DIAGNOSIS — D72829 Elevated white blood cell count, unspecified: Secondary | ICD-10-CM

## 2023-01-26 DIAGNOSIS — D649 Anemia, unspecified: Secondary | ICD-10-CM

## 2023-01-26 DIAGNOSIS — Z841 Family history of disorders of kidney and ureter: Secondary | ICD-10-CM | POA: Insufficient documentation

## 2023-01-26 DIAGNOSIS — Z8249 Family history of ischemic heart disease and other diseases of the circulatory system: Secondary | ICD-10-CM | POA: Insufficient documentation

## 2023-01-26 DIAGNOSIS — Z9049 Acquired absence of other specified parts of digestive tract: Secondary | ICD-10-CM | POA: Diagnosis not present

## 2023-01-26 DIAGNOSIS — Z800 Family history of malignant neoplasm of digestive organs: Secondary | ICD-10-CM | POA: Diagnosis not present

## 2023-01-26 DIAGNOSIS — Z803 Family history of malignant neoplasm of breast: Secondary | ICD-10-CM | POA: Insufficient documentation

## 2023-01-26 DIAGNOSIS — I482 Chronic atrial fibrillation, unspecified: Secondary | ICD-10-CM | POA: Insufficient documentation

## 2023-01-26 DIAGNOSIS — Z79899 Other long term (current) drug therapy: Secondary | ICD-10-CM | POA: Insufficient documentation

## 2023-01-26 DIAGNOSIS — Z833 Family history of diabetes mellitus: Secondary | ICD-10-CM | POA: Diagnosis not present

## 2023-01-26 DIAGNOSIS — K219 Gastro-esophageal reflux disease without esophagitis: Secondary | ICD-10-CM | POA: Diagnosis not present

## 2023-01-26 DIAGNOSIS — E782 Mixed hyperlipidemia: Secondary | ICD-10-CM | POA: Insufficient documentation

## 2023-01-26 DIAGNOSIS — I10 Essential (primary) hypertension: Secondary | ICD-10-CM | POA: Insufficient documentation

## 2023-01-26 DIAGNOSIS — Z808 Family history of malignant neoplasm of other organs or systems: Secondary | ICD-10-CM | POA: Insufficient documentation

## 2023-01-26 DIAGNOSIS — E119 Type 2 diabetes mellitus without complications: Secondary | ICD-10-CM | POA: Diagnosis not present

## 2023-01-26 DIAGNOSIS — Z8051 Family history of malignant neoplasm of kidney: Secondary | ICD-10-CM | POA: Diagnosis not present

## 2023-01-26 DIAGNOSIS — Z823 Family history of stroke: Secondary | ICD-10-CM | POA: Diagnosis not present

## 2023-01-26 DIAGNOSIS — Z8349 Family history of other endocrine, nutritional and metabolic diseases: Secondary | ICD-10-CM | POA: Insufficient documentation

## 2023-01-26 LAB — CBC WITH DIFFERENTIAL (CANCER CENTER ONLY)
Abs Immature Granulocytes: 0.23 10*3/uL — ABNORMAL HIGH (ref 0.00–0.07)
Basophils Absolute: 0 10*3/uL (ref 0.0–0.1)
Basophils Relative: 0 %
Eosinophils Absolute: 0.1 10*3/uL (ref 0.0–0.5)
Eosinophils Relative: 1 %
HCT: 35.9 % — ABNORMAL LOW (ref 36.0–46.0)
Hemoglobin: 11.3 g/dL — ABNORMAL LOW (ref 12.0–15.0)
Immature Granulocytes: 2 %
Lymphocytes Relative: 15 %
Lymphs Abs: 2.4 10*3/uL (ref 0.7–4.0)
MCH: 28 pg (ref 26.0–34.0)
MCHC: 31.5 g/dL (ref 30.0–36.0)
MCV: 88.9 fL (ref 80.0–100.0)
Monocytes Absolute: 1.1 10*3/uL — ABNORMAL HIGH (ref 0.1–1.0)
Monocytes Relative: 7 %
Neutro Abs: 11.9 10*3/uL — ABNORMAL HIGH (ref 1.7–7.7)
Neutrophils Relative %: 75 %
Platelet Count: 365 10*3/uL (ref 150–400)
RBC: 4.04 MIL/uL (ref 3.87–5.11)
RDW: 14.8 % (ref 11.5–15.5)
WBC Count: 15.9 10*3/uL — ABNORMAL HIGH (ref 4.0–10.5)
nRBC: 0 % (ref 0.0–0.2)
nRBC: 0 /100{WBCs}

## 2023-01-26 LAB — TECHNOLOGIST SMEAR REVIEW: Plt Morphology: NORMAL

## 2023-01-26 LAB — IRON AND TIBC
Iron: 52 ug/dL (ref 28–170)
Saturation Ratios: 11 % (ref 10.4–31.8)
TIBC: 473 ug/dL — ABNORMAL HIGH (ref 250–450)
UIBC: 421 ug/dL

## 2023-01-26 LAB — VITAMIN B12: Vitamin B-12: 140 pg/mL — ABNORMAL LOW (ref 180–914)

## 2023-01-26 LAB — FERRITIN: Ferritin: 41 ng/mL (ref 11–307)

## 2023-01-26 LAB — FOLATE: Folate: 12.6 ng/mL (ref >5.9)

## 2023-01-26 NOTE — Telephone Encounter (Signed)
01/26/23 Spoke with patient and confirmed next appt.

## 2023-01-26 NOTE — Progress Notes (Cosign Needed Addendum)
[facility]  [address] [place],  [place]  [address] [phone]   Addendum: Her B12 was found to be low at 140.  I recommend she start on B12 injections daily for 7 days, then weekly for 4 weeks then every 4 weeks indefinitely.  Clinic Day:  01/26/2023  Referring physician: Galvin Proffer, MD   CHIEF COMPLAINT:  CC: Leukocytosis   Current Treatment:  Observation  HISTORY OF PRESENT ILLNESS:  Suzanne Carey is a 62 y.o. female with a history of  leukocytosis who is referred back by Dr. Donnel Saxon for worsening leukocytosis and anemia. Her white blood cell count has been running between 12.4 and 13.7 since June 2022. Dr. Karna Christmas referred Suzanne Carey back for a elevated WBC. Her WBC today has increased from 13.2 on 01/19/2023 to 15.9 as of today.  She continues apixaban for atrial fibrillation. She denies any melena, hematochezia or other overt form of blood loss.  Her last colonoscopy was in 2009 which was negative.  (Apparently, Cologuard was negative.)  She informed me that she was recently on prednisone for her gout and finished last week.  She denies fatigue, appetite changes, weight loss, fevers, chills or night sweats.  She complains of sleep disturbance and abdominal cramping. Her weight has increased 6 pounds over last month .  REVIEW OF SYSTEMS:  Review of Systems  Constitutional:  Positive for unexpected weight change. Negative for appetite change, chills, diaphoresis, fatigue and fever.  HENT:   Negative for lump/mass, mouth sores, nosebleeds and sore throat.   Respiratory:  Negative for cough, hemoptysis and shortness of breath.   Cardiovascular:  Negative for chest pain and leg swelling.  Gastrointestinal:  Positive for abdominal pain (occasional cramping). Negative for blood in stool, constipation, diarrhea, nausea and vomiting.  Genitourinary:  Negative for difficulty urinating, dysuria, frequency, hematuria and vaginal bleeding.   Musculoskeletal:   Negative for arthralgias, back pain, gait problem and myalgias.  Skin:  Negative for rash.  Neurological:  Negative for dizziness, extremity weakness, gait problem, headaches, light-headedness and numbness.  Hematological:  Negative for adenopathy. Does not bruise/bleed easily.  Psychiatric/Behavioral:  Positive for sleep disturbance. Negative for depression. The patient is not nervous/anxious.     VITALS:  Blood pressure (!) 129/111, pulse 96, temperature 98 F (36.7 C), temperature source Oral, resp. rate 18, height 5\' 7"  (1.702 m), weight 292 lb 14.4 oz (132.9 kg), SpO2 98%.  Wt Readings from Last 3 Encounters:  01/26/23 292 lb 14.4 oz (132.9 kg)  12/22/22 286 lb (129.7 kg)  12/01/22 291 lb (132 kg)    Body mass index is 45.87 kg/m.  Performance status (ECOG): 1 - Symptomatic but completely ambulatory  PHYSICAL EXAM:  Physical Exam Vitals and nursing note reviewed. Exam conducted with a chaperone present.  Constitutional:      General: She is not in acute distress.    Appearance: Normal appearance. She is obese.  HENT:     Head: Normocephalic and atraumatic.     Mouth/Throat:     Mouth: Mucous membranes are moist.     Pharynx: Oropharynx is clear. No oropharyngeal exudate or posterior oropharyngeal erythema.  Eyes:     General: No scleral icterus.    Extraocular Movements: Extraocular movements intact.     Conjunctiva/sclera: Conjunctivae normal.     Pupils: Pupils are equal, round, and reactive to light.  Cardiovascular:     Rate and Rhythm: Normal rate and regular rhythm.     Heart  sounds: Normal heart sounds. No murmur heard.    No friction rub. No gallop.  Pulmonary:     Effort: Pulmonary effort is normal.     Breath sounds: Normal breath sounds. No wheezing, rhonchi or rales.  Abdominal:     General: There is no distension.     Palpations: Abdomen is soft. There is no mass.     Tenderness: There is no abdominal tenderness.     Comments: Difficult to assess for  hepatosplenomegaly due to her body habitus  Musculoskeletal:        General: Normal range of motion.     Cervical back: Normal range of motion and neck supple. No tenderness.     Right lower leg: No edema.     Left lower leg: No edema.  Lymphadenopathy:     Cervical: No cervical adenopathy.     Upper Body:     Right upper body: No supraclavicular or axillary adenopathy.     Left upper body: No supraclavicular or axillary adenopathy.     Lower Body: No right inguinal adenopathy. No left inguinal adenopathy.  Skin:    General: Skin is warm and dry.     Coloration: Skin is not jaundiced.     Findings: No rash.  Neurological:     General: No focal deficit present.     Mental Status: She is alert and oriented to person, place, and time. Mental status is at baseline.     Cranial Nerves: No cranial nerve deficit.  Psychiatric:        Mood and Affect: Mood normal.        Behavior: Behavior normal.        Thought Content: Thought content normal.        Judgment: Judgment normal.     LABS:      Latest Ref Rng & Units 01/26/2023    2:35 PM 12/22/2022   10:52 AM 05/26/2022   11:35 AM  CBC  WBC 4.0 - 10.5 K/uL 15.9  13.4  10.6   Hemoglobin 12.0 - 15.0 g/dL 16.1  09.6  04.5   Hematocrit 36.0 - 46.0 % 35.9  35.7  35.8   Platelets 150 - 400 K/uL 365  339  287       Latest Ref Rng & Units 12/22/2022   10:52 AM 09/23/2022   11:44 AM 05/26/2022   11:35 AM  CMP  Glucose 70 - 99 mg/dL 409  811  914   BUN 8 - 27 mg/dL 16  27  22    Creatinine 0.57 - 1.00 mg/dL 7.82  9.56  2.13   Sodium 134 - 144 mmol/L 140  138  139   Potassium 3.5 - 5.2 mmol/L 3.8  4.3  3.8   Chloride 96 - 106 mmol/L 98  98  99   CO2 20 - 29 mmol/L 24  25  22    Calcium 8.7 - 10.3 mg/dL 9.4  9.6  9.6   Total Protein 6.0 - 8.5 g/dL 7.0  7.1  7.1   Total Bilirubin 0.0 - 1.2 mg/dL 0.4  0.4  0.3   Alkaline Phos 44 - 121 IU/L 67  66  62   AST 0 - 40 IU/L 14  16  15    ALT 0 - 32 IU/L 10  11  12      STUDIES:  No results  found.   HISTORY:   Past Medical History:  Diagnosis Date   Chronic atrial fibrillation, unspecified (HCC) 03/28/2019  Depressed left ventricular ejection fraction 03/28/2019   Essential hypertension    Gastro-esophageal reflux disease without esophagitis    Generalized anxiety disorder    Hypertensive disorder 03/17/2020   Hypothyroidism    Mixed hyperlipidemia    Morbid obesity (HCC) 03/27/2019   Nontoxic multinodular goiter    Occlusion and stenosis of bilateral carotid arteries 03/31/2020   Other vitamin B12 deficiency anemias    Plantar fasciitis 12/17/2019   Polyp at cervical os 03/27/2019   Primary generalized (osteo)arthritis    Pulmonary hypertension (HCC) 03/28/2019   Shortness of breath 04/02/2020   Tightness of heel cord, left 12/17/2019   Type 2 diabetes mellitus with hyperglycemia (HCC)    Unstable angina (HCC)    Vitamin D deficiency     Past Surgical History:  Procedure Laterality Date   CESAREAN SECTION  2002   CHOLECYSTECTOMY  1994    Family History  Problem Relation Age of Onset   Thyroid disease Mother    Heart failure Mother    Diabetes Mother    Heart disease Mother    Hypertension Mother    Stroke Mother    Kidney disease Mother    Obesity Mother    Myelodysplastic syndrome Father 53   Hypertension Sister    Heart disease Sister    Multiple sclerosis Sister    Kidney cancer Brother    Colon cancer Brother    Colon cancer Paternal Aunt    Breast cancer Paternal Aunt 58   Breast cancer Cousin 50   Bone cancer Cousin 51 - 73    Social History:  reports that she has never smoked. She has never used smokeless tobacco. She reports that she does not drink alcohol and does not use drugs.The patient is accompanied by her husband today.  Allergies:  Allergies  Allergen Reactions   Dapagliflozin Other (See Comments)    Unknown   Lipitor [Atorvastatin] Swelling    Tachycardia   Latex Rash    Current Medications: Current Outpatient  Medications  Medication Sig Dispense Refill   omeprazole (PRILOSEC) 20 MG capsule Take 20 mg by mouth daily.     Semaglutide,0.25 or 0.5MG /DOS, (OZEMPIC, 0.25 OR 0.5 MG/DOSE,) 2 MG/1.5ML SOPN Inject 0.5 mg into the skin once a week. Patient not sure on dose samples given     ALPRAZolam (XANAX) 0.5 MG tablet Take 0.5 mg by mouth 3 (three) times daily as needed for anxiety or sleep.     apixaban (ELIQUIS) 5 MG TABS tablet Take 1 tablet (5 mg total) by mouth 2 (two) times daily. 60 tablet 5   Cholecalciferol (VITAMIN D) 50 MCG (2000 UT) tablet Take 2,000 Units by mouth daily.     digoxin (LANOXIN) 0.125 MG tablet TAKE 1 TABLET BY MOUTH EVERY DAY 90 tablet 3   docusate sodium (COLACE) 100 MG capsule Take 1 capsule (100 mg total) by mouth 2 (two) times daily. 60 capsule 0   ergocalciferol (VITAMIN D2) 1.25 MG (50000 UT) capsule Take 1 capsule (50,000 Units total) by mouth 2 (two) times a week. 9 capsule 0   furosemide (LASIX) 40 MG tablet Take 1 tablet (40 mg total) by mouth 2 (two) times daily. 180 tablet 1   glipiZIDE (GLUCOTROL XL) 5 MG 24 hr tablet Take 5 mg by mouth daily with breakfast.     hydrochlorothiazide (HYDRODIURIL) 25 MG tablet TAKE 1 TABLET BY MOUTH EVERY DAY 90 tablet 1   metoprolol succinate (TOPROL-XL) 25 MG 24 hr tablet Take 1 tablet (25  mg total) by mouth daily. 90 tablet 2   nitroGLYCERIN (NITROSTAT) 0.4 MG SL tablet Place under the tongue.     olmesartan (BENICAR) 20 MG tablet Take 1 tablet (20 mg total) by mouth daily. 90 tablet 1   pantoprazole (PROTONIX) 40 MG tablet Take 1 tablet (40 mg total) by mouth daily. (Patient not taking: Reported on 01/26/2023) 30 tablet 0   pioglitazone (ACTOS) 30 MG tablet Take 30 mg by mouth daily.     No current facility-administered medications for this visit.     ASSESSMENT & PLAN:   Assessment & Plan: Adamaris Cash is a 62 y.o. female with leukocytosis of uncertain etiology, which had been stable.  The white count has been  running between 12,000 and 14,000 since 2022. She was recently on prednisone, which is likely the cause of the worsening leukocytosis. She has borderline anemia, which is slightly worse. This is likely due to chronic kidney disease, but will obtain vitamin levels for further evaluation. Her family history is concerning for possible hereditary cancer syndrome. As she is unaffected, I recommended she ask her brother about for genetic testing, but I would be glad to refer her as well. I will plan to see her back in 1 month to be sure her WBC returns to baseline and anemia remains stable.   The patient understands the plans discussed today and is in agreement with them.  She knows to contact our office if she develops concerns prior to her next appointment.    40 minutes was spent in patient care.  This included time spent preparing to see the patient (e.g., review of tests), obtaining and/or reviewing separately obtained history, counseling and educating the patient/family/caregiver, ordering medications, tests, or procedures; documenting clinical information in the electronic or other health record, independently interpreting results and communicating results to the patient/family/caregiver as well as coordination of care.   Adah Perl, PA-C  [facility] - [facility] DE[facility] OF [facility] [address] [place] [address] Dept: [phone] Dept Fax: [phone]   Orders Placed This Encounter  Procedures   Technologist smear review    Standing Status:   Future    Number of Occurrences:   1    Expected Date:   01/26/2023    Expiration Date:   01/26/2024    Clinical information::   leukocytosis   Ferritin    Standing Status:   Future    Number of Occurrences:   1    Expected Date:   01/26/2023    Expiration Date:   01/26/2024   Folate    Standing Status:   Future    Number of Occurrences:   1    Expected Date:   01/26/2023    Expiration  Date:   01/26/2024   Vitamin B12    Standing Status:   Future    Number of Occurrences:   1    Expected Date:   01/26/2023    Expiration Date:   01/26/2024   Iron and TIBC    Standing Status:   Future    Number of Occurrences:   1    Expected Date:   01/26/2023    Expiration Date:   01/26/2024     I,Jasmine M Lassiter,acting as a scribe for Adah Perl, PA-C.,have documented all relevant documentation on the behalf of Adah Perl, PA-C,as directed by  Adah Perl, PA-C while in the presence of [facility] [place]  A Brixon Zhen, PA-C.

## 2023-01-31 ENCOUNTER — Other Ambulatory Visit: Payer: Self-pay | Admitting: Cardiology

## 2023-01-31 ENCOUNTER — Telehealth: Payer: Self-pay

## 2023-01-31 ENCOUNTER — Other Ambulatory Visit: Payer: Self-pay | Admitting: Hematology and Oncology

## 2023-01-31 MED ORDER — CYANOCOBALAMIN 1000 MCG/ML IJ SOLN: 1000.0000 ug | INTRAMUSCULAR | 0 refills | Status: DC

## 2023-01-31 NOTE — Telephone Encounter (Signed)
-----   Message from Adah Perl sent at 01/31/2023 11:24 AM EST ----- Hey, I forgot to call her last week. Would you let her know her B12 is low, so we recommend injections. Daily for 7 days, then weekly for 4 weeks, then every 4 weeks indefinitely. Let scheduling know you contacted her, so they can schedule. Thanks

## 2023-01-31 NOTE — Telephone Encounter (Signed)
Called patient and notified her of the message. Patient would like to do her B12 shots at home. Can be called into [facility]. Patient also would like to syringes to be called into the pharmacy with the B12. If she came to office and received them. She said it might cost her over $100 per visit.

## 2023-02-10 ENCOUNTER — Telehealth: Payer: Self-pay

## 2023-02-10 NOTE — Telephone Encounter (Signed)
 [facility] called and voiced [facility] never [facility] about [facility] injections. Patient asked if she checked with the pharmacy and she voiced no. [facility] and they [facility] prescription they voiced it was in a hold status and they would get it ready. Patient notified and voiced understanding. Patient also aware  to pick up syringes and needles.

## 2023-02-23 ENCOUNTER — Inpatient Hospital Stay: Payer: BC Managed Care – PPO | Attending: Hematology and Oncology | Admitting: Hematology and Oncology

## 2023-02-23 ENCOUNTER — Other Ambulatory Visit: Payer: Self-pay | Admitting: Hematology and Oncology

## 2023-02-23 ENCOUNTER — Encounter: Payer: Self-pay | Admitting: Hematology and Oncology

## 2023-02-23 ENCOUNTER — Inpatient Hospital Stay: Payer: BC Managed Care – PPO

## 2023-02-23 ENCOUNTER — Ambulatory Visit (INDEPENDENT_AMBULATORY_CARE_PROVIDER_SITE_OTHER): Payer: BC Managed Care – PPO | Admitting: Family Medicine

## 2023-02-23 VITALS — BP 110/66 | HR 86 | Temp 97.6°F | Resp 20 | Ht 67.0 in | Wt 292.0 lb

## 2023-02-23 DIAGNOSIS — D72829 Elevated white blood cell count, unspecified: Secondary | ICD-10-CM

## 2023-02-23 DIAGNOSIS — E538 Deficiency of other specified B group vitamins: Secondary | ICD-10-CM | POA: Insufficient documentation

## 2023-02-23 DIAGNOSIS — E669 Obesity, unspecified: Secondary | ICD-10-CM | POA: Insufficient documentation

## 2023-02-23 DIAGNOSIS — N189 Chronic kidney disease, unspecified: Secondary | ICD-10-CM | POA: Insufficient documentation

## 2023-02-23 DIAGNOSIS — D519 Vitamin B12 deficiency anemia, unspecified: Secondary | ICD-10-CM | POA: Diagnosis not present

## 2023-02-23 DIAGNOSIS — D649 Anemia, unspecified: Secondary | ICD-10-CM

## 2023-02-23 DIAGNOSIS — Z79899 Other long term (current) drug therapy: Secondary | ICD-10-CM | POA: Diagnosis not present

## 2023-02-23 LAB — CMP (CANCER CENTER ONLY)
ALT: 13 U/L (ref 0–44)
AST: 16 U/L (ref 15–41)
Albumin: 4.2 g/dL (ref 3.5–5.0)
Alkaline Phosphatase: 65 U/L (ref 38–126)
Anion gap: 12 (ref 5–15)
BUN: 19 mg/dL (ref 8–23)
CO2: 29 mmol/L (ref 22–32)
Calcium: 9.7 mg/dL (ref 8.9–10.3)
Chloride: 98 mmol/L (ref 98–111)
Creatinine: 1.4 mg/dL — ABNORMAL HIGH (ref 0.44–1.00)
GFR, Estimated: 42 mL/min — ABNORMAL LOW (ref >60)
Glucose, Bld: 208 mg/dL — ABNORMAL HIGH (ref 70–99)
Potassium: 3.8 mmol/L (ref 3.5–5.1)
Sodium: 139 mmol/L (ref 135–145)
Total Bilirubin: 0.3 mg/dL (ref 0.0–1.2)
Total Protein: 6.9 g/dL (ref 6.5–8.1)

## 2023-02-23 LAB — CBC WITH DIFFERENTIAL (CANCER CENTER ONLY)
Abs Immature Granulocytes: 0.21 10*3/uL — ABNORMAL HIGH (ref 0.00–0.07)
Basophils Absolute: 0.1 10*3/uL (ref 0.0–0.1)
Basophils Relative: 0 %
Eosinophils Absolute: 0.1 10*3/uL (ref 0.0–0.5)
Eosinophils Relative: 1 %
HCT: 34.8 % — ABNORMAL LOW (ref 36.0–46.0)
Hemoglobin: 11.3 g/dL — ABNORMAL LOW (ref 12.0–15.0)
Immature Granulocytes: 1 %
Lymphocytes Relative: 13 %
Lymphs Abs: 2 10*3/uL (ref 0.7–4.0)
MCH: 28.8 pg (ref 26.0–34.0)
MCHC: 32.5 g/dL (ref 30.0–36.0)
MCV: 88.8 fL (ref 80.0–100.0)
Monocytes Absolute: 1.1 10*3/uL — ABNORMAL HIGH (ref 0.1–1.0)
Monocytes Relative: 7 %
Neutro Abs: 11.7 10*3/uL — ABNORMAL HIGH (ref 1.7–7.7)
Neutrophils Relative %: 78 %
Platelet Count: 328 10*3/uL (ref 150–400)
RBC: 3.92 MIL/uL (ref 3.87–5.11)
RDW: 14.6 % (ref 11.5–15.5)
WBC Count: 15.2 10*3/uL — ABNORMAL HIGH (ref 4.0–10.5)
nRBC: 0 % (ref 0.0–0.2)
nRBC: 0 /100{WBCs}

## 2023-02-23 LAB — FOLATE: Folate: 12.5 ng/mL (ref >5.9)

## 2023-02-23 LAB — IRON AND TIBC
Iron: 44 ug/dL (ref 28–170)
Saturation Ratios: 10 % — ABNORMAL LOW (ref 10.4–31.8)
TIBC: 437 ug/dL (ref 250–450)
UIBC: 393 ug/dL

## 2023-02-23 LAB — VITAMIN B12: Vitamin B-12: 1024 pg/mL — ABNORMAL HIGH (ref 180–914)

## 2023-02-23 LAB — FERRITIN: Ferritin: 37 ng/mL (ref 11–307)

## 2023-02-23 NOTE — Progress Notes (Cosign Needed)
[facility]  [address] [place],  [place]  [address] [phone]  Clinic Day:  02/23/2023  Referring physician: Galvin Proffer, MD   HISTORY OF PRESENT ILLNESS:  The patient is a 63 y.o. female with chronic leukocytosis since June 2022, which we felt was most likely benign. When I saw her in March 2024, her white count was normal.  She was referred back in December with recurrent leukocytosis and mild anemia.  The leukocytosis was felt to be due to prednisone use.  She was found to be B12 deficient. She was started on B12 injections at home. She is here for repeat clinical assessment. She denies recent prednisone use. She denies symptoms of infection. She states she just started B12 and had a B12 injection yesterday.  PHYSICAL EXAM:  Blood pressure 110/66, pulse 86, temperature 97.6 F (36.4 C), temperature source Oral, resp. rate 20, height 5\' 7"  (1.702 m), weight 292 lb (132.5 kg), SpO2 97%. Wt Readings from Last 3 Encounters:  02/23/23 292 lb (132.5 kg)  01/26/23 292 lb 14.4 oz (132.9 kg)  12/22/22 286 lb (129.7 kg)   Body mass index is 45.73 kg/m.  Performance status (ECOG): 1 - Symptomatic but completely ambulatory  Physical Exam Vitals and nursing note reviewed.  Constitutional:      General: She is not in acute distress.    Appearance: Normal appearance. She is obese. She is not ill-appearing.  HENT:     Head: Normocephalic and atraumatic.     Mouth/Throat:     Mouth: Mucous membranes are moist.     Pharynx: Oropharynx is clear. No oropharyngeal exudate or posterior oropharyngeal erythema.  Eyes:     General: No scleral icterus.    Extraocular Movements: Extraocular movements intact.     Conjunctiva/sclera: Conjunctivae normal.     Pupils: Pupils are equal, round, and reactive to light.  Cardiovascular:     Rate and Rhythm: Normal rate and regular rhythm.     Heart sounds: Normal heart sounds. No murmur heard.    No friction rub. No gallop.   Pulmonary:     Effort: Pulmonary effort is normal.     Breath sounds: Normal breath sounds. No wheezing, rhonchi or rales.  Abdominal:     General: There is no distension.     Palpations: Abdomen is soft. There is no mass.     Tenderness: There is no abdominal tenderness.     Comments: It is difficult to assess for hepatosplenomegaly due to body habitus.  Musculoskeletal:        General: Normal range of motion.     Cervical back: Normal range of motion and neck supple. No tenderness.     Right lower leg: No edema.     Left lower leg: No edema.  Lymphadenopathy:     Cervical: No cervical adenopathy.     Upper Body:     Right upper body: No supraclavicular or axillary adenopathy.     Left upper body: No supraclavicular or axillary adenopathy.     Lower Body: No right inguinal adenopathy. No left inguinal adenopathy.  Skin:    General: Skin is warm and dry.     Coloration: Skin is not jaundiced.     Findings: No rash.  Neurological:     Mental Status: She is alert and oriented to person, place, and time.     Cranial Nerves: No cranial nerve deficit.  Psychiatric:        Mood and  Affect: Mood normal.        Behavior: Behavior normal.        Thought Content: Thought content normal.     LABS:      Latest Ref Rng & Units 02/23/2023    1:53 PM 01/26/2023    2:35 PM 12/22/2022   10:52 AM  CBC  WBC 4.0 - 10.5 K/uL 15.2  15.9  13.4   Hemoglobin 12.0 - 15.0 g/dL 21.3  08.6  57.8   Hematocrit 36.0 - 46.0 % 34.8  35.9  35.7   Platelets 150 - 400 K/uL 328  365  339       Latest Ref Rng & Units 02/23/2023    1:53 PM 12/22/2022   10:52 AM 09/23/2022   11:44 AM  CMP  Glucose 70 - 99 mg/dL 469  629  528   BUN 8 - 23 mg/dL 19  16  27    Creatinine 0.44 - 1.00 mg/dL 4.13  2.44  0.10   Sodium 135 - 145 mmol/L 139  140  138   Potassium 3.5 - 5.1 mmol/L 3.8  3.8  4.3   Chloride 98 - 111 mmol/L 98  98  98   CO2 22 - 32 mmol/L 29  24  25    Calcium 8.9 - 10.3 mg/dL 9.7  9.4  9.6    Total Protein 6.5 - 8.1 g/dL 6.9  7.0  7.1   Total Bilirubin 0.0 - 1.2 mg/dL 0.3  0.4  0.4   Alkaline Phos 38 - 126 U/L 65  67  66   AST 15 - 41 U/L 16  14  16    ALT 0 - 44 U/L 13  10  11      Lab Results  Component Value Date   TIBC 473 (H) 01/26/2023   FERRITIN 41 01/26/2023   IRONPCTSAT 11 01/26/2023    Review Flowsheet       Latest Ref Rng & Units 01/26/2023  Oncology Labs  Ferritin 11 - 307 ng/mL 41   %SAT 10.4 - 31.8 % 11      STUDIES:  No results found.    ASSESSMENT & PLAN:   Assessment/Plan:  63 y.o. female with a history of chronic leukocytosis since June 2022, of uncertain etiology. This had been fairly stable, but increased slightly recently.  She has mild anemia and was found to be B12 deficient. She has recently started a protracted course of B12. She also has chronic kidney disease, which can contribute to her anemia.  PCR for BCR/ABL pending. If this is negative, I will plan to see her back in 3 months for repeat clinical assessment. The patient understands all the plans discussed today and is in agreement with them.  She knows to contact our office if she develops concerns prior to her next appointment.     Adah Perl, PA-C   Physician Assistant [facility] [phone]

## 2023-02-28 LAB — BCR-ABL1 FISH
Cells Analyzed: 200
Cells Counted: 200

## 2023-03-02 ENCOUNTER — Other Ambulatory Visit: Payer: Self-pay | Admitting: Hematology and Oncology

## 2023-03-02 DIAGNOSIS — D72829 Elevated white blood cell count, unspecified: Secondary | ICD-10-CM

## 2023-03-02 NOTE — Progress Notes (Unsigned)
 Marland Kitchen

## 2023-04-14 ENCOUNTER — Ambulatory Visit
Admission: RE | Admit: 2023-04-14 | Discharge: 2023-04-14 | Disposition: A | Discharge status: Home / Self Care | Payer: BC Managed Care – PPO | Source: Ambulatory Visit | Attending: Obstetrics and Gynecology | Admitting: Obstetrics and Gynecology

## 2023-04-14 DIAGNOSIS — Z1231 Encounter for screening mammogram for malignant neoplasm of breast: Secondary | ICD-10-CM | POA: Diagnosis not present

## 2023-04-14 DIAGNOSIS — Z0000 Encounter for general adult medical examination without abnormal findings: Secondary | ICD-10-CM

## 2023-04-20 DIAGNOSIS — D519 Vitamin B12 deficiency anemia, unspecified: Secondary | ICD-10-CM | POA: Diagnosis not present

## 2023-04-20 DIAGNOSIS — Z79899 Other long term (current) drug therapy: Secondary | ICD-10-CM | POA: Diagnosis not present

## 2023-04-20 DIAGNOSIS — I10 Essential (primary) hypertension: Secondary | ICD-10-CM | POA: Diagnosis not present

## 2023-04-20 DIAGNOSIS — E782 Mixed hyperlipidemia: Secondary | ICD-10-CM | POA: Diagnosis not present

## 2023-04-20 DIAGNOSIS — E038 Other specified hypothyroidism: Secondary | ICD-10-CM | POA: Diagnosis not present

## 2023-04-20 DIAGNOSIS — E1165 Type 2 diabetes mellitus with hyperglycemia: Secondary | ICD-10-CM | POA: Diagnosis not present

## 2023-04-20 DIAGNOSIS — E559 Vitamin D deficiency, unspecified: Secondary | ICD-10-CM | POA: Diagnosis not present

## 2023-04-27 ENCOUNTER — Other Ambulatory Visit: Payer: Self-pay | Admitting: Hematology and Oncology

## 2023-04-28 ENCOUNTER — Other Ambulatory Visit: Payer: Self-pay

## 2023-04-28 DIAGNOSIS — D518 Other vitamin B12 deficiency anemias: Secondary | ICD-10-CM

## 2023-04-28 MED ORDER — CYANOCOBALAMIN 1000 MCG/ML IJ SOLN: 1000.0000 ug | INTRAMUSCULAR | 3 refills | Status: AC

## 2023-05-10 ENCOUNTER — Ambulatory Visit: Payer: BC Managed Care – PPO | Admitting: Cardiology

## 2023-05-12 DIAGNOSIS — Z01419 Encounter for gynecological examination (general) (routine) without abnormal findings: Secondary | ICD-10-CM | POA: Diagnosis not present

## 2023-05-13 ENCOUNTER — Ambulatory Visit: Attending: Cardiology | Admitting: Cardiology

## 2023-05-13 ENCOUNTER — Encounter: Payer: Self-pay | Admitting: Cardiology

## 2023-05-13 VITALS — BP 100/60 | HR 100 | Ht 68.0 in | Wt 291.6 lb

## 2023-05-13 DIAGNOSIS — Z7901 Long term (current) use of anticoagulants: Secondary | ICD-10-CM

## 2023-05-13 DIAGNOSIS — Z6841 Body Mass Index (BMI) 40.0 and over, adult: Secondary | ICD-10-CM

## 2023-05-13 DIAGNOSIS — I5032 Chronic diastolic (congestive) heart failure: Secondary | ICD-10-CM

## 2023-05-13 DIAGNOSIS — I10 Essential (primary) hypertension: Secondary | ICD-10-CM

## 2023-05-13 DIAGNOSIS — Z79899 Other long term (current) drug therapy: Secondary | ICD-10-CM

## 2023-05-13 DIAGNOSIS — I482 Chronic atrial fibrillation, unspecified: Secondary | ICD-10-CM

## 2023-05-13 NOTE — Progress Notes (Signed)
 Cardiology Office Note:  .   Date:  05/13/2023  ID:  Suzanne Carey, DOB 1961/01/03, MRN [id number] PCP: Galvin Proffer, MD  [facility] Providers Cardiologist:  Norman Herrlich, MD    History of Present Illness: .   Suzanne Carey is a 63 y.o. female with a past medical history of HFpEF, chronic atrial fibrillation on Eliquis and digoxin, hypertension, carotid artery stenosis, pulmonary hypertension, GERD, DM2, hypothyroidism, CKD stage III, obesity, dyslipidemia.  03/21/2019 echo EF 45 to 50%, mild concentric LVH, biatrial enlargement, mild MR, mild TR, RVSP estimated 55 mmHg  Most recently evaluated by Dr. Dulce Sellar on 05/12/2022, she was stable from a cardiac perspective, no changes were made to her medications or plan of care and she was advised to follow-up in 1 year as she also follows closely with her PCP who checks her digoxin level.  She presents today for follow-up of her atrial fibrillation.  She has been doing well from a cardiac perspective.  She offers no formal complaints.  She is following with hematology regarding leukocytosis, there have not been any contributory causes.  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  We did discuss trying to get her back on her Ozempic to help with weight loss however it appears her insurance will not cover for the 0.25 mg dose with a 0.5 mg dose.  She did mention that her PCP might have those samples, unguinal refer her to Pharm.D. to see if there is anything we can do to help her get on a GLP-1. She denies chest pain, palpitations, dyspnea, pnd, orthopnea, n, v, dizziness, syncope, edema, weight gain, or early satiety.   ROS: Review of Systems  Cardiovascular:  Positive for leg swelling.  All other systems reviewed and are negative.    Studies Reviewed: Marland Kitchen   EKG Interpretation Date/Time:  Friday May 13 2023 14:09:41 EDT Ventricular Rate:  100 PR Interval:    QRS Duration:  90 QT Interval:  318 QTC Calculation: 410 R  Axis:   29  Text Interpretation: Atrial fibrillation with premature ventricular or aberrantly conducted complexes Nonspecific ST and T wave abnormality Abnormal ECG No previous ECGs available Confirmed by Wallis Bamberg [phone]) on 05/13/2023 2:17:45 PM        Risk Assessment/Calculations:    CHA2DS2-VASc Score = 5   This indicates a 7.2% annual risk of stroke. The patient's score is based upon: CHF History: 1 HTN History: 1 Diabetes History: 1 Stroke History: 0 Vascular Disease History: 1 Age Score: 0 Gender Score: 1            Physical Exam:   VS:  BP 100/60   Pulse 100   Ht 5\' 8"  (1.727 m)   Wt 291 lb 9.6 oz (132.3 kg)   SpO2 97%   BMI 44.34 kg/m    Wt Readings from Last 3 Encounters:  05/13/23 291 lb 9.6 oz (132.3 kg)  02/23/23 292 lb (132.5 kg)  01/26/23 292 lb 14.4 oz (132.9 kg)    GEN: Well nourished, well developed in no acute distress NECK: No JVD; No carotid bruits CARDIAC: Irregularly irregular, no murmurs, rubs, gallops RESPIRATORY:  Clear to auscultation without rales, wheezing or rhonchi  ABDOMEN: Soft, non-tender, non-distended EXTREMITIES:  No edema; No deformity   ASSESSMENT AND PLAN: .   Permanent atrial fibrillation/hypercoagulable state/on digoxin-her PCP recently checked her digoxin 2 months ago and it was 1.0.  Her rate is borderline controlled at 100 bpm.  Her CHA2DS2-VASc  score is 5.  Continue Eliquis 5 mg twice daily--no indication for dose reduction, continue digoxin 0.125 mg daily, continue metoprolol 25 mg daily.  Most recent 12.2, hematocrit 31.7, creatinine 1.4.  Denies hematuria, hemoptysis, hematochezia.  Hypertension-her blood pressure is low normal at 100/60, although she is asymptomatic.  Continue HCTZ 25 mg daily, continue Benicar 20 mg daily.  HFpEF-NYHA class I, euvolemic, continue metoprolol 25 mg daily, continue Benicar 20 mg daily, continue Lasix 40 mg daily.  Obesity-BMI is greater than 44, obviously weight loss would contribute  to her overall health and wellbeing.  I am going to refer her to Pharm.D. to see if there is any way we could help her get on Ozempic.    Dispo: Pharm.D. referral for GLP-1 assistance. Follow up in 1 year.   Signed, Flossie Dibble, NP

## 2023-05-13 NOTE — Patient Instructions (Signed)
 Medication Instructions:  Your physician recommends that you continue on your current medications as directed. Please refer to the Current Medication list given to you today.  *If you need a refill on your cardiac medications before your next appointment, please call your pharmacy*  Lab Work: NONE If you have labs (blood work) drawn today and your tests are completely normal, you will receive your results only by: MyChart Message (if you have MyChart) OR A paper copy in the mail If you have any lab test that is abnormal or we need to change your treatment, we will call you to review the results.  Testing/Procedures: NONE  Follow-Up: At [facility], you and your health needs are our priority.  As part of our continuing mission to provide you with exceptional heart care, our providers are all part of one team.  This team includes your primary Cardiologist (physician) and Advanced Practice Providers or APPs (Physician Assistants and Nurse Practitioners) who all work together to provide you with the care you need, when you need it.  Your next appointment:   1 year(s)  Provider:   Norman Herrlich, MD    We recommend signing up for the patient portal called "MyChart".  Sign up information is provided on this After Visit Summary.  MyChart is used to connect with patients for Virtual Visits (Telemedicine).  Patients are able to view lab/test results, encounter notes, upcoming appointments, etc.  Non-urgent messages can be sent to your provider as well.   To learn more about what you can do with MyChart, go to [email or web address].   Other Instructions

## 2023-05-25 ENCOUNTER — Inpatient Hospital Stay: Payer: BC Managed Care – PPO | Attending: Hematology and Oncology | Admitting: Hematology and Oncology

## 2023-05-25 ENCOUNTER — Encounter: Payer: Self-pay | Admitting: Hematology and Oncology

## 2023-05-25 ENCOUNTER — Inpatient Hospital Stay: Payer: BC Managed Care – PPO

## 2023-05-25 ENCOUNTER — Telehealth: Payer: Self-pay | Admitting: Hematology and Oncology

## 2023-05-25 ENCOUNTER — Telehealth: Payer: Self-pay

## 2023-05-25 VITALS — BP 118/79 | HR 90 | Temp 97.7°F | Resp 20 | Ht 68.0 in | Wt 284.7 lb

## 2023-05-25 DIAGNOSIS — Z79899 Other long term (current) drug therapy: Secondary | ICD-10-CM | POA: Diagnosis not present

## 2023-05-25 DIAGNOSIS — D649 Anemia, unspecified: Secondary | ICD-10-CM | POA: Insufficient documentation

## 2023-05-25 DIAGNOSIS — D519 Vitamin B12 deficiency anemia, unspecified: Secondary | ICD-10-CM

## 2023-05-25 DIAGNOSIS — D72829 Elevated white blood cell count, unspecified: Secondary | ICD-10-CM | POA: Diagnosis not present

## 2023-05-25 DIAGNOSIS — E538 Deficiency of other specified B group vitamins: Secondary | ICD-10-CM | POA: Insufficient documentation

## 2023-05-25 LAB — CBC WITH DIFFERENTIAL (CANCER CENTER ONLY)
Abs Immature Granulocytes: 0.15 10*3/uL — ABNORMAL HIGH (ref 0.00–0.07)
Basophils Absolute: 0 10*3/uL (ref 0.0–0.1)
Basophils Relative: 0 %
Eosinophils Absolute: 0.1 10*3/uL (ref 0.0–0.5)
Eosinophils Relative: 1 %
HCT: 35.4 % — ABNORMAL LOW (ref 36.0–46.0)
Hemoglobin: 11.6 g/dL — ABNORMAL LOW (ref 12.0–15.0)
Immature Granulocytes: 1 %
Lymphocytes Relative: 17 %
Lymphs Abs: 2 10*3/uL (ref 0.7–4.0)
MCH: 28.7 pg (ref 26.0–34.0)
MCHC: 32.8 g/dL (ref 30.0–36.0)
MCV: 87.6 fL (ref 80.0–100.0)
Monocytes Absolute: 0.8 10*3/uL (ref 0.1–1.0)
Monocytes Relative: 7 %
Neutro Abs: 8.6 10*3/uL — ABNORMAL HIGH (ref 1.7–7.7)
Neutrophils Relative %: 74 %
Platelet Count: 276 10*3/uL (ref 150–400)
RBC: 4.04 MIL/uL (ref 3.87–5.11)
RDW: 14.6 % (ref 11.5–15.5)
WBC Count: 11.7 10*3/uL — ABNORMAL HIGH (ref 4.0–10.5)
nRBC: 0 % (ref 0.0–0.2)
nRBC: 0 /100{WBCs}

## 2023-05-25 LAB — CMP (CANCER CENTER ONLY)
ALT: 13 U/L (ref 0–44)
AST: 21 U/L (ref 15–41)
Albumin: 3.9 g/dL (ref 3.5–5.0)
Alkaline Phosphatase: 59 U/L (ref 38–126)
Anion gap: 13 (ref 5–15)
BUN: 32 mg/dL — ABNORMAL HIGH (ref 8–23)
CO2: 25 mmol/L (ref 22–32)
Calcium: 9.4 mg/dL (ref 8.9–10.3)
Chloride: 94 mmol/L — ABNORMAL LOW (ref 98–111)
Creatinine: 1.76 mg/dL — ABNORMAL HIGH (ref 0.44–1.00)
GFR, Estimated: 32 mL/min — ABNORMAL LOW (ref >60)
Glucose, Bld: 229 mg/dL — ABNORMAL HIGH (ref 70–99)
Potassium: 3.3 mmol/L — ABNORMAL LOW (ref 3.5–5.1)
Sodium: 132 mmol/L — ABNORMAL LOW (ref 135–145)
Total Bilirubin: 0.4 mg/dL (ref 0.0–1.2)
Total Protein: 7.1 g/dL (ref 6.5–8.1)

## 2023-05-25 LAB — IRON AND TIBC
Iron: 57 ug/dL (ref 28–170)
Saturation Ratios: 15 % (ref 10.4–31.8)
TIBC: 391 ug/dL (ref 250–450)
UIBC: 334 ug/dL

## 2023-05-25 LAB — FERRITIN: Ferritin: 86 ng/mL (ref 11–307)

## 2023-05-25 NOTE — Telephone Encounter (Signed)
-----   Message from Alfonso Ike sent at 05/25/2023 12:06 PM EDT ----- Please fax labs to Dr. Beverlie Buckle. Thanks

## 2023-05-25 NOTE — Progress Notes (Cosign Needed)
 [facility]  [address] [place],  [place]  [address] [phone]  Clinic Day:  05/25/2023  Referring physician: Georgean Kindle, MD   HISTORY OF PRESENT ILLNESS:  The patient is a 63 y.o. female with leukocytosis which is felt to be reactive.  She was found to have B12 deficiency and placed on B12 injections in October.  She is here today for repeat clinical assessment.  Denies B symptoms.  She states she saw Dr. Delta Fila last month and white count was higher. WBC 16.2, creatinine 1.76, vitamin B12 was 615.  TSH and and vitamin D  were normal.  She denies infection or steroid use at that time.  She continues B12 monthly at home, but does not like the injections.  She states her cardiologist wants her to start back on Ozempic  for better blood sugar control and weight management.  Bilateral screening mammogram in March did not reveal any evidence of malignancy.  She states she has had Cologuard testing.  PHYSICAL EXAM:  Blood pressure 118/79, pulse 90, temperature 97.7 F (36.5 C), temperature source Oral, resp. rate 20, height 5\' 8"  (1.727 m), weight 284 lb 11.2 oz (129.1 kg), SpO2 97%. Wt Readings from Last 3 Encounters:  05/25/23 284 lb 11.2 oz (129.1 kg)  05/13/23 291 lb 9.6 oz (132.3 kg)  02/23/23 292 lb (132.5 kg)   Body mass index is 43.29 kg/m.  Performance status (ECOG): 1 - Symptomatic but completely ambulatory  Physical Exam Vitals and nursing note reviewed.  Constitutional:      General: She is not in acute distress.    Appearance: Normal appearance.  HENT:     Head: Normocephalic and atraumatic.     Mouth/Throat:     Mouth: Mucous membranes are moist.     Pharynx: Oropharynx is clear. No oropharyngeal exudate or posterior oropharyngeal erythema.  Eyes:     General: No scleral icterus.    Extraocular Movements: Extraocular movements intact.     Conjunctiva/sclera: Conjunctivae normal.     Pupils: Pupils are equal, round, and reactive to light.   Cardiovascular:     Rate and Rhythm: Normal rate and regular rhythm.     Heart sounds: Normal heart sounds. No murmur heard.    No friction rub. No gallop.  Pulmonary:     Effort: Pulmonary effort is normal.     Breath sounds: Normal breath sounds. No wheezing, rhonchi or rales.  Abdominal:     General: There is no distension.     Palpations: Abdomen is soft. There is no mass.     Tenderness: There is no abdominal tenderness.     Comments: It is difficult to assess for hepatosplenomegaly due to body habitus.  Musculoskeletal:        General: Normal range of motion.     Cervical back: Normal range of motion and neck supple. No tenderness.     Right lower leg: No edema.     Left lower leg: No edema.  Lymphadenopathy:     Cervical: No cervical adenopathy.     Upper Body:     Right upper body: No supraclavicular or axillary adenopathy.     Left upper body: No supraclavicular or axillary adenopathy.  Skin:    General: Skin is warm and dry.     Coloration: Skin is not jaundiced.     Findings: No rash.  Neurological:     Mental Status: She is alert and oriented to person, place, and time.  Cranial Nerves: No cranial nerve deficit.  Psychiatric:        Mood and Affect: Mood normal.        Behavior: Behavior normal.        Thought Content: Thought content normal.     LABS:      Latest Ref Rng & Units 05/25/2023   11:04 AM 02/23/2023    1:53 PM 01/26/2023    2:35 PM  CBC  WBC 4.0 - 10.5 K/uL 11.7  15.2  15.9   Hemoglobin 12.0 - 15.0 g/dL 65.7  84.6  96.2   Hematocrit 36.0 - 46.0 % 35.4  34.8  35.9   Platelets 150 - 400 K/uL 276  328  365       Latest Ref Rng & Units 05/25/2023   11:04 AM 02/23/2023    1:53 PM 12/22/2022   10:52 AM  CMP  Glucose 70 - 99 mg/dL 952  841  324   BUN 8 - 23 mg/dL 32  19  16   Creatinine 0.44 - 1.00 mg/dL 4.01  0.27  2.53   Sodium 135 - 145 mmol/L 132  139  140   Potassium 3.5 - 5.1 mmol/L 3.3  3.8  3.8   Chloride 98 - 111 mmol/L 94  98   98   CO2 22 - 32 mmol/L 25  29  24    Calcium  8.9 - 10.3 mg/dL 9.4  9.7  9.4   Total Protein 6.5 - 8.1 g/dL 7.1  6.9  7.0   Total Bilirubin 0.0 - 1.2 mg/dL 0.4  0.3  0.4   Alkaline Phos 38 - 126 U/L 59  65  67   AST 15 - 41 U/L 21  16  14    ALT 0 - 44 U/L 13  13  10      Lab Results  Component Value Date   TIBC 437 02/23/2023   TIBC 473 (H) 01/26/2023   FERRITIN 37 02/23/2023   FERRITIN 41 01/26/2023   IRONPCTSAT 10 (L) 02/23/2023   IRONPCTSAT 11 01/26/2023     Review Flowsheet       Latest Ref Rng & Units 01/26/2023 02/23/2023  Oncology Labs  Ferritin 11 - 307 ng/mL 41  37   %SAT 10.4 - 31.8 % 11  10      STUDIES:  No results found.    ASSESSMENT & PLAN:   Assessment/Plan:  63 y.o. female with chronic mild leukocytosis, which is felt to be reactive. Testing for chronic leukemias was negative. The WBCs are back down again today and there is nothing concerning per her labs today.  She has persistent mild anemia, which most likely represents anemia of chronic kidney disease. There is no evidence of nutritional deficiency contributing to her anemia at this time.  As her B12 was well within normal limits last month, she will discontinue B12 injections and start oral B12 1000 mcg daily.  As she sees Dr. Beverlie Buckle at regularly, I will plan to see her back in 6 months for repeat clinical assessment.  The patient understands all the plans discussed today and is in agreement with them.  She knows to contact our office if she develops concerns prior to her next appointment.     Alfonso Ike, PA-C   Physician Assistant [facility] [place] [phone]

## 2023-05-25 NOTE — Telephone Encounter (Signed)
 Labs faxed

## 2023-05-25 NOTE — Telephone Encounter (Signed)
 Patient has been scheduled for follow-up visit per 05/25/23 LOS.  Pt aware of scheduled appt details.

## 2023-05-26 LAB — SOLUBLE TRANSFERRIN RECEPTOR: Transferrin Receptor: 21.5 nmol/L (ref 12.2–27.3)

## 2023-06-20 DIAGNOSIS — M1712 Unilateral primary osteoarthritis, left knee: Secondary | ICD-10-CM | POA: Diagnosis not present

## 2023-07-15 ENCOUNTER — Other Ambulatory Visit: Payer: Self-pay | Admitting: Cardiology

## 2023-07-20 ENCOUNTER — Ambulatory Visit: Admitting: Pharmacist Clinician (PhC)/ Clinical Pharmacy Specialist

## 2023-07-20 DIAGNOSIS — E782 Mixed hyperlipidemia: Secondary | ICD-10-CM | POA: Diagnosis not present

## 2023-07-20 DIAGNOSIS — I10 Essential (primary) hypertension: Secondary | ICD-10-CM | POA: Diagnosis not present

## 2023-07-20 DIAGNOSIS — E038 Other specified hypothyroidism: Secondary | ICD-10-CM | POA: Diagnosis not present

## 2023-07-20 DIAGNOSIS — E1165 Type 2 diabetes mellitus with hyperglycemia: Secondary | ICD-10-CM | POA: Diagnosis not present

## 2023-08-28 ENCOUNTER — Other Ambulatory Visit: Payer: Self-pay | Admitting: Cardiology

## 2023-08-28 DIAGNOSIS — I480 Paroxysmal atrial fibrillation: Secondary | ICD-10-CM

## 2023-09-07 ENCOUNTER — Telehealth: Payer: Self-pay | Admitting: Cardiology

## 2023-09-07 DIAGNOSIS — I480 Paroxysmal atrial fibrillation: Secondary | ICD-10-CM

## 2023-09-07 MED ORDER — APIXABAN 5 MG PO TABS: 5.0000 mg | ORAL_TABLET | Freq: Two times a day (BID) | ORAL | 5 refills | Status: DC

## 2023-09-07 NOTE — Telephone Encounter (Signed)
*  STAT* If patient is at the pharmacy, call can be transferred to refill team.   1. Which medications need to be refilled? (please list name of each medication and dose if known) apixaban (ELIQUIS) 5 MG TABS tablet  2. Which pharmacy/location (including street and city if local pharmacy) is medication to be sent to? [id number] [place] [place] - [address]  3. Do they need a 30 day or 90 day supply? [place]

## 2023-09-07 NOTE — Telephone Encounter (Signed)
 Pt called back, she is completely out of med

## 2023-09-08 ENCOUNTER — Ambulatory Visit: Attending: Cardiology | Admitting: Pharmacist

## 2023-09-08 NOTE — Progress Notes (Deleted)
 Patient ID: Suzanne Carey                 DOB: 12/05/1960                    MRN: [id number]     HPI: Suzanne Carey is a 63 y.o. female patient referred to pharmacy clinic by Delon Hoover, NP to initiate GLP1-RA therapy. PMH is significant for T2DM, unstable angina, hypertension, CHF, atrial fibrilation, HLD  and obesity. Most recent BMI 43.3 kg/m . Patient was on semaglutide  12/2021, per fill history.  Baseline weight and BMI: 291 lbs 45.57 kg/m  Current weight and BMI: 284 lbs 43.3 kg/m  Current meds that affect weight: ****   If diabetic and on insulin /sulfonylurea, can consider reducing dose to reduce risk of hypoglycemia- currently on glipizide 10 mg every morning    Diet:   Exercise:   Family History:   Social History:   Labs: Lab Results  Component Value Date   HGBA1C 7.5 (H) 12/22/2022    Wt Readings from Last 1 Encounters:  05/25/23 284 lb 11.2 oz (129.1 kg)    BP Readings from Last 1 Encounters:  05/25/23 118/79   Pulse Readings from Last 1 Encounters:  05/25/23 90       Component Value Date/Time   CHOL 199 12/22/2022 1052   TRIG 237 (H) 12/22/2022 1052   HDL 30 (L) 12/22/2022 1052   CHOLHDL 4.4 03/28/2019 1136   LDLCALC 127 (H) 12/22/2022 1052    Past Medical History:  Diagnosis Date   Acute on chronic congestive heart failure (HCC) 12/22/2022   BMI 40.0-44.9, adult (HCC) 03/25/2022   Chronic atrial fibrillation, unspecified (HCC) 03/28/2019   Chronic diastolic heart failure (HCC) 05/09/2020   Class 3 severe obesity with serious comorbidity and body mass index (BMI) of 45.0 to 49.9 in adult 03/27/2019   CRI (chronic renal insufficiency), stage 3 (moderate) (HCC) 10/27/2021   Depressed left ventricular ejection fraction 03/28/2019   Essential hypertension    Gastro-esophageal reflux disease without esophagitis    Gastroesophageal reflux disease 03/25/2022   Generalized anxiety disorder    Hyperlipidemia associated with type 2  diabetes mellitus (HCC) 07/22/2020   Hypertensive disorder 03/17/2020   Hypothyroidism    Lower extremity edema 11/26/2021   Mixed hyperlipidemia    Morbid obesity (HCC) 03/27/2019   Nontoxic multinodular goiter    Occlusion and stenosis of bilateral carotid arteries 03/31/2020   Other vitamin B12 deficiency anemias    Plantar fasciitis 12/17/2019   Polyp at cervical os 03/27/2019   Primary generalized (osteo)arthritis    Pulmonary hypertension (HCC) 03/28/2019   Shortness of breath 04/02/2020   Tightness of heel cord, left 12/17/2019   Type 2 diabetes mellitus with hyperglycemia (HCC)    Type 2 diabetes mellitus with stage 3a chronic kidney disease (HCC) 08/20/2021   Unstable angina (HCC)    Vitamin D  deficiency     Current Outpatient Medications on File Prior to Visit  Medication Sig Dispense Refill   ALPRAZolam (XANAX) 0.5 MG tablet Take 0.5 mg by mouth 3 (three) times daily as needed for anxiety or sleep.     apixaban  (ELIQUIS ) 5 MG TABS tablet Take 1 tablet (5 mg total) by mouth 2 (two) times daily. 60 tablet 5   Cholecalciferol (D3 2000) 50 MCG (2000 UT) CAPS Take 2,000 Units by mouth daily.     cyanocobalamin  (VITAMIN B12) 1000 MCG/ML injection Inject 1 mL (1,000 mcg total) into the muscle  every 30 (thirty) days. 3 mL 3   digoxin  (LANOXIN ) 0.125 MG tablet TAKE 1 TABLET BY MOUTH EVERY DAY 90 tablet 3   docusate sodium  (COLACE) 100 MG capsule Take 1 capsule (100 mg total) by mouth 2 (two) times daily. 60 capsule 0   ergocalciferol  (VITAMIN D2) 1.25 MG (50000 UT) capsule Take 1 capsule (50,000 Units total) by mouth 2 (two) times a week. 9 capsule 0   furosemide  (LASIX ) 40 MG tablet Take 1 tablet (40 mg total) by mouth 2 (two) times daily. 180 tablet 1   glipiZIDE (GLUCOTROL XL) 10 MG 24 hr tablet Take 10 mg by mouth every morning.     hydrochlorothiazide  (HYDRODIURIL ) 25 MG tablet TAKE 1 TABLET BY MOUTH EVERY DAY 90 tablet 1   metoprolol  succinate (TOPROL -XL) 25 MG 24 hr tablet  TAKE 1 TABLET (25 MG TOTAL) BY MOUTH DAILY. 90 tablet 3   nitroGLYCERIN (NITROSTAT) 0.4 MG SL tablet Place 0.4 mg under the tongue every 5 (five) minutes as needed for chest pain.     olmesartan  (BENICAR ) 20 MG tablet Take 1 tablet (20 mg total) by mouth daily. 90 tablet 1   omeprazole (PRILOSEC) 20 MG capsule Take 20 mg by mouth daily.     pioglitazone (ACTOS) 30 MG tablet Take 30 mg by mouth daily.     No current facility-administered medications on file prior to visit.    Allergies  Allergen Reactions   Dapagliflozin Other (See Comments)    Unknown   Lipitor [Atorvastatin ] Swelling    Tachycardia   Latex Rash     Assessment/Plan:  1. Weight loss - Patient has not met goal of at least 5% of body weight loss with comprehensive lifestyle modifications alone in the past 3-6 months. Pharmacotherapy is appropriate to pursue as augmentation. Will start*** . Confirmed patient not ***pregnant and no personal or family history of medullary thyroid  carcinoma (MTC) or Multiple Endocrine Neoplasia syndrome type 2 (MEN 2). Injection technique reviewed at today's visit.  Advised patient on common side effects including nausea, diarrhea, dyspepsia, decreased appetite, and fatigue. Counseled patient on reducing meal size and how to titrate medication to minimize side effects. Counseled patient to call if intolerable side effects or if experiencing dehydration, abdominal pain, or dizziness. Along with pharmacotherapy, the patient will follow dietary modifications and aim for at least 150 minutes of moderate-intensity exercise per week, plus resistance training twice a week (as recommended by the American Heart Association). This resistance training--such as weightlifting, bodyweight exercises, or using resistance bands, adapted to the patient's ability--will help prevent muscle loss.  Follow up in 1-2 days regarding coverage of *** . If therapy is initiated, phone follow-ups will be conducted every 4 weeks  for dose titration until the patient reaches the effective therapeutic dose and target weight.  Robbi Blanch, Pharm.D [place] [facility]. [facility] [address], [place], [place] [address] Phone: [phone]; Fax: [phone]

## 2023-09-28 DIAGNOSIS — M1712 Unilateral primary osteoarthritis, left knee: Secondary | ICD-10-CM | POA: Diagnosis not present

## 2023-10-11 ENCOUNTER — Ambulatory Visit: Admitting: Gastroenterology

## 2023-10-16 ENCOUNTER — Other Ambulatory Visit: Payer: Self-pay | Admitting: Cardiology

## 2023-10-17 ENCOUNTER — Other Ambulatory Visit: Payer: Self-pay | Admitting: Cardiology

## 2023-10-19 DIAGNOSIS — Z79899 Other long term (current) drug therapy: Secondary | ICD-10-CM | POA: Diagnosis not present

## 2023-10-19 DIAGNOSIS — Z0000 Encounter for general adult medical examination without abnormal findings: Secondary | ICD-10-CM | POA: Diagnosis not present

## 2023-10-19 DIAGNOSIS — E038 Other specified hypothyroidism: Secondary | ICD-10-CM | POA: Diagnosis not present

## 2023-10-19 DIAGNOSIS — D519 Vitamin B12 deficiency anemia, unspecified: Secondary | ICD-10-CM | POA: Diagnosis not present

## 2023-10-19 DIAGNOSIS — I10 Essential (primary) hypertension: Secondary | ICD-10-CM | POA: Diagnosis not present

## 2023-10-19 DIAGNOSIS — E782 Mixed hyperlipidemia: Secondary | ICD-10-CM | POA: Diagnosis not present

## 2023-10-19 DIAGNOSIS — E1165 Type 2 diabetes mellitus with hyperglycemia: Secondary | ICD-10-CM | POA: Diagnosis not present

## 2023-11-24 ENCOUNTER — Ambulatory Visit: Admitting: Hematology and Oncology

## 2023-11-24 ENCOUNTER — Other Ambulatory Visit

## 2023-11-29 ENCOUNTER — Other Ambulatory Visit: Payer: Self-pay | Admitting: Hematology and Oncology

## 2023-11-29 DIAGNOSIS — D72829 Elevated white blood cell count, unspecified: Secondary | ICD-10-CM

## 2023-11-30 ENCOUNTER — Other Ambulatory Visit: Payer: Self-pay | Admitting: Hematology and Oncology

## 2023-11-30 DIAGNOSIS — D72829 Elevated white blood cell count, unspecified: Secondary | ICD-10-CM

## 2023-11-30 DIAGNOSIS — D518 Other vitamin B12 deficiency anemias: Secondary | ICD-10-CM

## 2023-11-30 NOTE — Progress Notes (Unsigned)
 [facility]  [address] [place],  [place]  [address] [phone]  Clinic Day:  12/01/2023  Referring physician: Pia Kerney SQUIBB, MD   HISTORY OF PRESENT ILLNESS:  The patient is a 63 y.o. female with leukocytosis, which is felt to be reactive, who I began seeing in March 2024.  Testing for chronic myelogenous leukemia was negative.  This has remained stable.  She has had a mild chronic anemia with her hemoglobin running between 11 and 12 since June 2022.  She was found to have B12 deficiency in December 2024, so was placed on B12 injections.  She has had persistent mild anemia despite treatment of the B12 deficiency.    She is here today for repeat clinical assessment.  She denies any B symptoms.  She denies progressive fatigue concerning for worsening anemia.  She reports shortness of breath with exertion, but denies cough, chest pain or palpitations.  She denies any overt form of blood loss.  She states she continues B12 injections monthly at home.  She states her hemoglobin A1C was up in September, so glipizide was increased from 5 mg to 10 mg daily. She states she was due to see Dr. Charlanne in September but had to cancel.  She continues apixaban  5 mg twice daily for atrial fibrillation  VITALS:   Blood pressure 117/79, pulse 92, temperature 97.9 F (36.6 C), temperature source Oral, resp. rate 20, height 5' 8 (1.727 m), weight (!) 301 lb 11.2 oz (136.9 kg), SpO2 97%. Wt Readings from Last 3 Encounters:  12/01/23 (!) 301 lb 11.2 oz (136.9 kg)  05/25/23 284 lb 11.2 oz (129.1 kg)  05/13/23 291 lb 9.6 oz (132.3 kg)   Body mass index is 45.87 kg/m.  Performance status (ECOG): 1 - Symptomatic but completely ambulatory  PHYSICAL EXAM:   Physical Exam Vitals and nursing note reviewed.  Constitutional:      General: She is not in acute distress.    Appearance: Normal appearance. She is obese. She is not ill-appearing.  HENT:     Head: Normocephalic and  atraumatic.     Mouth/Throat:     Mouth: Mucous membranes are moist.     Pharynx: Oropharynx is clear. No oropharyngeal exudate or posterior oropharyngeal erythema.  Eyes:     General: No scleral icterus.    Extraocular Movements: Extraocular movements intact.     Conjunctiva/sclera: Conjunctivae normal.     Pupils: Pupils are equal, round, and reactive to light.  Cardiovascular:     Rate and Rhythm: Normal rate and regular rhythm.     Heart sounds: Normal heart sounds. No murmur heard.    No friction rub. No gallop.  Pulmonary:     Effort: Pulmonary effort is normal.     Breath sounds: Normal breath sounds. No wheezing, rhonchi or rales.  Abdominal:     General: There is no distension.     Palpations: Abdomen is soft. There is no hepatomegaly, splenomegaly or mass.     Tenderness: There is no abdominal tenderness.  Musculoskeletal:        General: Normal range of motion.     Cervical back: Normal range of motion and neck supple. No tenderness.     Right lower leg: No edema.     Left lower leg: No edema.  Lymphadenopathy:     Cervical: No cervical adenopathy.     Upper Body:     Right upper body: No supraclavicular or axillary adenopathy.  Left upper body: No supraclavicular or axillary adenopathy.     Lower Body: No right inguinal adenopathy. No left inguinal adenopathy.  Skin:    General: Skin is warm and dry.     Coloration: Skin is not jaundiced.     Findings: No rash.  Neurological:     Mental Status: She is alert and oriented to person, place, and time.     Cranial Nerves: No cranial nerve deficit.  Psychiatric:        Mood and Affect: Mood normal.        Behavior: Behavior normal.        Thought Content: Thought content normal.      LABS:      Latest Ref Rng & Units 12/01/2023   10:12 AM 05/25/2023   11:04 AM 02/23/2023    1:53 PM  CBC  WBC 4.0 - 10.5 K/uL 12.3  11.7  15.2   Hemoglobin 12.0 - 15.0 g/dL 89.1  88.3  88.6   Hematocrit 36.0 - 46.0 % 34.0   35.4  34.8   Platelets 150 - 400 K/uL 269  276  328       Latest Ref Rng & Units 05/25/2023   11:04 AM 02/23/2023    1:53 PM 12/22/2022   10:52 AM  CMP  Glucose 70 - 99 mg/dL 770  791  834   BUN 8 - 23 mg/dL 32  19  16   Creatinine 0.44 - 1.00 mg/dL 8.23  8.59  8.67   Sodium 135 - 145 mmol/L 132  139  140   Potassium 3.5 - 5.1 mmol/L 3.3  3.8  3.8   Chloride 98 - 111 mmol/L 94  98  98   CO2 22 - 32 mmol/L 25  29  24    Calcium  8.9 - 10.3 mg/dL 9.4  9.7  9.4   Total Protein 6.5 - 8.1 g/dL 7.1  6.9  7.0   Total Bilirubin 0.0 - 1.2 mg/dL 0.4  0.3  0.4   Alkaline Phos 38 - 126 U/L 59  65  67   AST 15 - 41 U/L 21  16  14    ALT 0 - 44 U/L 13  13  10      No results found for: STEPHANY RINGS, A1GS, A2GS, BETS, BETA2SER, GAMS, MSPIKE, SPEI  Lab Results  Component Value Date   TIBC 391 05/25/2023   TIBC 437 02/23/2023   TIBC 473 (H) 01/26/2023   FERRITIN 86 05/25/2023   FERRITIN 37 02/23/2023   FERRITIN 41 01/26/2023   IRONPCTSAT 15 05/25/2023   IRONPCTSAT 10 (L) 02/23/2023   IRONPCTSAT 11 01/26/2023   No results found for: LDH  No results found for: AFPTUMOR, TOTALPROTELP, ALBUMINELP, A1GS, A2GS, BETS, BETA2SER, GAMS, MSPIKE, SPEI, LDH, CEA1, CEA, PSA1, IGASERUM, IGGSERUM, IGMSERUM, THGAB, THYROGLB  Review Flowsheet  More data may exist      Latest Ref Rng & Units 01/26/2023 02/23/2023 05/25/2023  Oncology Labs  Ferritin 11 - 307 ng/mL 41  37  86   %SAT 10.4 - 31.8 % 11  10  15       STUDIES:   No results found.    ASSESSMENT & PLAN:   Assessment/Plan:  63 y.o. female with mild leukocytosis and anemia.  The leukocytosis is stable.  Her anemia has worsened despite continuing B12 injections.  Further evaluation is pending from today.  I have been seeing her every 6 months, but we will plan to see her back in 3  months for repeat clinical assessment.  The patient understands all the plans discussed today and  is in agreement with them.  She knows to contact our office if she develops concerns prior to her next appointment.     Andrez DELENA Foy, PA-C   Physician Assistant [facility] [phone]

## 2023-12-01 ENCOUNTER — Encounter: Payer: Self-pay | Admitting: Hematology and Oncology

## 2023-12-01 ENCOUNTER — Other Ambulatory Visit: Payer: Self-pay

## 2023-12-01 ENCOUNTER — Inpatient Hospital Stay: Admitting: Hematology and Oncology

## 2023-12-01 ENCOUNTER — Inpatient Hospital Stay: Attending: Hematology and Oncology

## 2023-12-01 VITALS — BP 117/79 | HR 92 | Temp 97.9°F | Resp 20 | Ht 68.0 in | Wt 301.7 lb

## 2023-12-01 DIAGNOSIS — D72829 Elevated white blood cell count, unspecified: Secondary | ICD-10-CM | POA: Insufficient documentation

## 2023-12-01 DIAGNOSIS — E538 Deficiency of other specified B group vitamins: Secondary | ICD-10-CM | POA: Insufficient documentation

## 2023-12-01 DIAGNOSIS — D649 Anemia, unspecified: Secondary | ICD-10-CM | POA: Insufficient documentation

## 2023-12-01 DIAGNOSIS — Z79899 Other long term (current) drug therapy: Secondary | ICD-10-CM | POA: Diagnosis not present

## 2023-12-01 DIAGNOSIS — D518 Other vitamin B12 deficiency anemias: Secondary | ICD-10-CM

## 2023-12-01 DIAGNOSIS — I4891 Unspecified atrial fibrillation: Secondary | ICD-10-CM | POA: Insufficient documentation

## 2023-12-01 DIAGNOSIS — R0602 Shortness of breath: Secondary | ICD-10-CM | POA: Insufficient documentation

## 2023-12-01 DIAGNOSIS — Z7901 Long term (current) use of anticoagulants: Secondary | ICD-10-CM | POA: Insufficient documentation

## 2023-12-01 LAB — TECHNOLOGIST SMEAR REVIEW: Plt Morphology: NORMAL

## 2023-12-01 LAB — CBC WITH DIFFERENTIAL (CANCER CENTER ONLY)
Abs Immature Granulocytes: 0.14 K/uL — ABNORMAL HIGH (ref 0.00–0.07)
Basophils Absolute: 0 K/uL (ref 0.0–0.1)
Basophils Relative: 0 %
Eosinophils Absolute: 0.1 K/uL (ref 0.0–0.5)
Eosinophils Relative: 1 %
HCT: 34 % — ABNORMAL LOW (ref 36.0–46.0)
Hemoglobin: 10.8 g/dL — ABNORMAL LOW (ref 12.0–15.0)
Immature Granulocytes: 1 %
Lymphocytes Relative: 11 %
Lymphs Abs: 1.4 K/uL (ref 0.7–4.0)
MCH: 28.6 pg (ref 26.0–34.0)
MCHC: 31.8 g/dL (ref 30.0–36.0)
MCV: 89.9 fL (ref 80.0–100.0)
Monocytes Absolute: 0.8 K/uL (ref 0.1–1.0)
Monocytes Relative: 7 %
Neutro Abs: 9.9 K/uL — ABNORMAL HIGH (ref 1.7–7.7)
Neutrophils Relative %: 80 %
Platelet Count: 269 K/uL (ref 150–400)
RBC: 3.78 MIL/uL — ABNORMAL LOW (ref 3.87–5.11)
RDW: 14.6 % (ref 11.5–15.5)
WBC Count: 12.3 K/uL — ABNORMAL HIGH (ref 4.0–10.5)
nRBC: 0 % (ref 0.0–0.2)

## 2023-12-01 LAB — CMP (CANCER CENTER ONLY)
ALT: 13 U/L (ref 0–44)
AST: 12 U/L — ABNORMAL LOW (ref 15–41)
Albumin: 3.9 g/dL (ref 3.5–5.0)
Alkaline Phosphatase: 63 U/L (ref 38–126)
Anion gap: 12 (ref 5–15)
BUN: 26 mg/dL — ABNORMAL HIGH (ref 8–23)
CO2: 23 mmol/L (ref 22–32)
Calcium: 9.9 mg/dL (ref 8.9–10.3)
Chloride: 102 mmol/L (ref 98–111)
Creatinine: 1.42 mg/dL — ABNORMAL HIGH (ref 0.44–1.00)
GFR, Estimated: 41 mL/min — ABNORMAL LOW (ref >60)
Glucose, Bld: 225 mg/dL — ABNORMAL HIGH (ref 70–99)
Potassium: 3.9 mmol/L (ref 3.5–5.1)
Sodium: 137 mmol/L (ref 135–145)
Total Bilirubin: 0.4 mg/dL (ref 0.0–1.2)
Total Protein: 7.1 g/dL (ref 6.5–8.1)

## 2023-12-01 LAB — FERRITIN: Ferritin: 66 ng/mL (ref 11–307)

## 2023-12-01 LAB — IRON AND TIBC
Iron: 58 ug/dL (ref 28–170)
Saturation Ratios: 13 % (ref 10.4–31.8)
TIBC: 452 ug/dL — ABNORMAL HIGH (ref 250–450)
UIBC: 394 ug/dL

## 2023-12-01 LAB — FOLATE: Folate: 8.2 ng/mL (ref >5.9)

## 2023-12-01 LAB — VITAMIN B12: Vitamin B-12: 589 pg/mL (ref 180–914)

## 2023-12-02 LAB — SOLUBLE TRANSFERRIN RECEPTOR: Transferrin Receptor: 20 nmol/L (ref 12.2–27.3)

## 2023-12-05 LAB — PROTEIN ELECTROPHORESIS, SERUM, WITH REFLEX
A/G Ratio: 0.9 (ref 0.7–1.7)
Albumin ELP: 3 g/dL (ref 2.9–4.4)
Alpha-1-Globulin: 0.3 g/dL (ref 0.0–0.4)
Alpha-2-Globulin: 0.9 g/dL (ref 0.4–1.0)
Beta Globulin: 0.9 g/dL (ref 0.7–1.3)
Gamma Globulin: 1.1 g/dL (ref 0.4–1.8)
Globulin, Total: 3.2 g/dL (ref 2.2–3.9)
Total Protein ELP: 6.2 g/dL (ref 6.0–8.5)

## 2023-12-07 ENCOUNTER — Telehealth: Payer: Self-pay | Admitting: Cardiology

## 2023-12-07 ENCOUNTER — Other Ambulatory Visit: Payer: Self-pay

## 2023-12-07 MED ORDER — APIXABAN 5 MG PO TABS: 5.0000 mg | ORAL_TABLET | Freq: Two times a day (BID) | ORAL | 0 refills | Status: DC

## 2023-12-07 NOTE — Telephone Encounter (Signed)
 Called the patient and she stated that she was not able to afford her Eliquis  as it was too expensive. However, since she left the current message she has been able to get Medicare insurance and she is just waiting for her Medicare insurance card to come in the mail. Once she has her new insurance card she states that she  will be able to afford the Eliquis . Today she is asking for samples to get her through until her new card comes in the mail. Eliquis  samples were set aside in the sample closet for the patient to pick - up. Patient verbalized understanding and had no further questions at this time.

## 2023-12-07 NOTE — Telephone Encounter (Signed)
 Pt c/o medication issue:  1. Name of Medication: apixaban  (ELIQUIS ) 5 MG TABS tablet   2. How are you currently taking this medication (dosage and times per day)? Take 1 tablet (5 mg total) by mouth 2 (two) times daily.   3. Are you having a reaction (difficulty breathing--STAT)? No   4. What is your medication issue? Pt can no longer afford. Asking about other options or financial assistance. Please advise.  Patient calling the office for samples of medication:   1.  What medication and dosage are you requesting samples for? apixaban  (ELIQUIS ) 5 MG TABS tablet   2.  Are you currently out of this medication? Yes

## 2023-12-08 ENCOUNTER — Telehealth: Payer: Self-pay

## 2023-12-08 NOTE — Telephone Encounter (Signed)
-----   Message from Andrez DELENA Foy sent at 12/07/2023  8:47 AM EDT ----- Please let her know the additional tests I ordered were normal. Keep f/u as scheduled unless symptoms of worsening anemia. Thanks

## 2023-12-08 NOTE — Telephone Encounter (Signed)
 Patient has been advised of normal labs and to keep appointment as scheduled unless symptomatic.

## 2024-03-01 ENCOUNTER — Other Ambulatory Visit: Payer: Self-pay | Admitting: Hematology and Oncology

## 2024-03-01 DIAGNOSIS — D72829 Elevated white blood cell count, unspecified: Secondary | ICD-10-CM

## 2024-03-01 DIAGNOSIS — D518 Other vitamin B12 deficiency anemias: Secondary | ICD-10-CM

## 2024-03-02 ENCOUNTER — Inpatient Hospital Stay: Attending: Hematology and Oncology | Admitting: Hematology and Oncology

## 2024-03-02 ENCOUNTER — Inpatient Hospital Stay

## 2024-03-02 ENCOUNTER — Other Ambulatory Visit: Payer: Self-pay | Admitting: Obstetrics and Gynecology

## 2024-03-02 ENCOUNTER — Encounter: Payer: Self-pay | Admitting: Hematology and Oncology

## 2024-03-02 ENCOUNTER — Telehealth: Payer: Self-pay | Admitting: Hematology and Oncology

## 2024-03-02 VITALS — BP 116/72 | HR 95 | Temp 97.7°F | Resp 18 | Ht 68.0 in | Wt 295.3 lb

## 2024-03-02 DIAGNOSIS — D72829 Elevated white blood cell count, unspecified: Secondary | ICD-10-CM

## 2024-03-02 DIAGNOSIS — Z1231 Encounter for screening mammogram for malignant neoplasm of breast: Secondary | ICD-10-CM

## 2024-03-02 DIAGNOSIS — E538 Deficiency of other specified B group vitamins: Secondary | ICD-10-CM | POA: Insufficient documentation

## 2024-03-02 DIAGNOSIS — D649 Anemia, unspecified: Secondary | ICD-10-CM | POA: Diagnosis not present

## 2024-03-02 DIAGNOSIS — N189 Chronic kidney disease, unspecified: Secondary | ICD-10-CM | POA: Diagnosis not present

## 2024-03-02 DIAGNOSIS — R0602 Shortness of breath: Secondary | ICD-10-CM | POA: Diagnosis not present

## 2024-03-02 DIAGNOSIS — D518 Other vitamin B12 deficiency anemias: Secondary | ICD-10-CM

## 2024-03-02 DIAGNOSIS — D519 Vitamin B12 deficiency anemia, unspecified: Secondary | ICD-10-CM

## 2024-03-02 DIAGNOSIS — Z79899 Other long term (current) drug therapy: Secondary | ICD-10-CM | POA: Diagnosis not present

## 2024-03-02 LAB — CBC WITH DIFFERENTIAL (CANCER CENTER ONLY)
Abs Immature Granulocytes: 0.14 K/uL — ABNORMAL HIGH (ref 0.00–0.07)
Basophils Absolute: 0 K/uL (ref 0.0–0.1)
Basophils Relative: 0 %
Eosinophils Absolute: 0.1 K/uL (ref 0.0–0.5)
Eosinophils Relative: 1 %
HCT: 34.1 % — ABNORMAL LOW (ref 36.0–46.0)
Hemoglobin: 10.8 g/dL — ABNORMAL LOW (ref 12.0–15.0)
Immature Granulocytes: 1 %
Lymphocytes Relative: 10 %
Lymphs Abs: 1.4 K/uL (ref 0.7–4.0)
MCH: 28.2 pg (ref 26.0–34.0)
MCHC: 31.7 g/dL (ref 30.0–36.0)
MCV: 89 fL (ref 80.0–100.0)
Monocytes Absolute: 1 K/uL (ref 0.1–1.0)
Monocytes Relative: 7 %
Neutro Abs: 11.2 K/uL — ABNORMAL HIGH (ref 1.7–7.7)
Neutrophils Relative %: 81 %
Platelet Count: 294 K/uL (ref 150–400)
RBC: 3.83 MIL/uL — ABNORMAL LOW (ref 3.87–5.11)
RDW: 14.1 % (ref 11.5–15.5)
WBC Count: 13.9 K/uL — ABNORMAL HIGH (ref 4.0–10.5)
nRBC: 0 % (ref 0.0–0.2)

## 2024-03-02 LAB — IRON AND TIBC
Iron: 55 ug/dL (ref 28–170)
Saturation Ratios: 14 % (ref 10.4–31.8)
TIBC: 406 ug/dL (ref 250–450)
UIBC: 351 ug/dL

## 2024-03-02 LAB — CMP (CANCER CENTER ONLY)
ALT: 13 U/L (ref 0–44)
AST: 15 U/L (ref 15–41)
Albumin: 3.7 g/dL (ref 3.5–5.0)
Alkaline Phosphatase: 61 U/L (ref 38–126)
Anion gap: 13 (ref 5–15)
BUN: 19 mg/dL (ref 8–23)
CO2: 26 mmol/L (ref 22–32)
Calcium: 9.7 mg/dL (ref 8.9–10.3)
Chloride: 97 mmol/L — ABNORMAL LOW (ref 98–111)
Creatinine: 1.5 mg/dL — ABNORMAL HIGH (ref 0.44–1.00)
GFR, Estimated: 38 mL/min — ABNORMAL LOW
Glucose, Bld: 269 mg/dL — ABNORMAL HIGH (ref 70–99)
Potassium: 3.5 mmol/L (ref 3.5–5.1)
Sodium: 135 mmol/L (ref 135–145)
Total Bilirubin: 0.5 mg/dL (ref 0.0–1.2)
Total Protein: 6.9 g/dL (ref 6.5–8.1)

## 2024-03-02 LAB — VITAMIN B12: Vitamin B-12: 957 pg/mL — ABNORMAL HIGH (ref 180–914)

## 2024-03-02 LAB — FERRITIN: Ferritin: 66 ng/mL (ref 11–307)

## 2024-03-02 NOTE — Telephone Encounter (Signed)
 Patient has been scheduled for follow-up visit per 03/02/2024 LOS.  Pt noted appt details on personal electronic device.

## 2024-03-02 NOTE — Progress Notes (Cosign Needed)
 " [facility]  [address] [place],  [place]  [address] [phone]  Clinic Day:  03/02/2024  Referring physician: Pia Kerney SQUIBB, MD   HISTORY OF PRESENT ILLNESS:  The patient is a 64 y.o. female with leukocytosis, which is felt to be reactive, who I began seeing in March 2024.  Testing for chronic myelogenous leukemia was negative.  This has remained stable.  She has had a mild chronic anemia with her hemoglobin running between 11 and 12 since June 2022.  She was found to have B12 deficiency in December 2024, so was placed on B12 injections.  She has had persistent mild anemia despite treatment of the B12 deficiency.  Further evaluation including serum protein electrophoresis did not reveal a specific etiology for the anemia.  This is most likely due to chronic kidney disease.  She is here today for repeat clinical assessment.  She denies progressive fatigue concerning for worsening anemia. She denies any overt form of blood loss. She denies any B symptoms.  She reports stable shortness of breath with exertion.  She states she continues B12 injections monthly at home. She states she was due to see Dr. Charlanne in September but had to cancel, she has not rescheduled.  She states she is scheduled with Dr. Pia, Dr. Monetta and her eye doctor in March.  She also needs to schedule her mammogram.   VITALS:   Blood pressure 116/72, pulse 95, temperature 97.7 F (36.5 C), temperature source Oral, resp. rate 18, height 5' 8 (1.727 m), weight 295 lb 4.8 oz (133.9 kg), SpO2 98%. Wt Readings from Last 3 Encounters:  03/02/24 295 lb 4.8 oz (133.9 kg)  12/01/23 (!) 301 lb 11.2 oz (136.9 kg)  05/25/23 284 lb 11.2 oz (129.1 kg)   Body mass index is 44.9 kg/m.  Performance status (ECOG): 1 - Symptomatic but completely ambulatory  PHYSICAL EXAM:   Physical Exam Vitals and nursing note reviewed.  Constitutional:      General: She is not in acute distress.    Appearance: Normal  appearance. She is not ill-appearing.  HENT:     Head: Normocephalic and atraumatic.     Mouth/Throat:     Mouth: Mucous membranes are moist.     Pharynx: Oropharynx is clear. No oropharyngeal exudate or posterior oropharyngeal erythema.  Eyes:     General: No scleral icterus.    Extraocular Movements: Extraocular movements intact.     Conjunctiva/sclera: Conjunctivae normal.     Pupils: Pupils are equal, round, and reactive to light.  Cardiovascular:     Rate and Rhythm: Normal rate and regular rhythm.     Heart sounds: Normal heart sounds. No murmur heard.    No friction rub. No gallop.  Pulmonary:     Effort: Pulmonary effort is normal.     Breath sounds: Normal breath sounds. No wheezing, rhonchi or rales.  Abdominal:     General: There is no distension.     Palpations: Abdomen is soft. There is no mass.     Tenderness: There is no abdominal tenderness.     Comments: Difficult to assess for hepatosplenomegaly due to body habitus  Musculoskeletal:        General: Normal range of motion.     Cervical back: Normal range of motion and neck supple. No tenderness.     Right lower leg: No edema.     Left lower leg: No edema.  Lymphadenopathy:     Cervical: No cervical  adenopathy.     Upper Body:     Right upper body: No supraclavicular or axillary adenopathy.     Left upper body: No supraclavicular or axillary adenopathy.     Lower Body: No right inguinal adenopathy. No left inguinal adenopathy.  Skin:    General: Skin is warm and dry.     Coloration: Skin is not jaundiced.     Findings: No rash.  Neurological:     Mental Status: She is alert and oriented to person, place, and time.     Cranial Nerves: No cranial nerve deficit.  Psychiatric:        Mood and Affect: Mood normal.        Behavior: Behavior normal.        Thought Content: Thought content normal.      LABS:      Latest Ref Rng & Units 03/02/2024   10:41 AM 12/01/2023   10:12 AM 05/25/2023   11:04 AM  CBC   WBC 4.0 - 10.5 K/uL 13.9  12.3  11.7   Hemoglobin 12.0 - 15.0 g/dL 89.1  89.1  88.3   Hematocrit 36.0 - 46.0 % 34.1  34.0  35.4   Platelets 150 - 400 K/uL 294  269  276       Latest Ref Rng & Units 03/02/2024   10:41 AM 12/01/2023   10:12 AM 05/25/2023   11:04 AM  CMP  Glucose 70 - 99 mg/dL 730  774  770   BUN 8 - 23 mg/dL 19  26  32   Creatinine 0.44 - 1.00 mg/dL 8.49  8.57  8.23   Sodium 135 - 145 mmol/L 135  137  132   Potassium 3.5 - 5.1 mmol/L 3.5  3.9  3.3   Chloride 98 - 111 mmol/L 97  102  94   CO2 22 - 32 mmol/L 26  23  25    Calcium  8.9 - 10.3 mg/dL 9.7  9.9  9.4   Total Protein 6.5 - 8.1 g/dL 6.9  7.1  7.1   Total Bilirubin 0.0 - 1.2 mg/dL 0.5  0.4  0.4   Alkaline Phos 38 - 126 U/L 61  63  59   AST 15 - 41 U/L 15  12  21    ALT 0 - 44 U/L 13  13  13       Lab Results  Component Value Date   TIBC 406 03/02/2024   TIBC 452 (H) 12/01/2023   TIBC 391 05/25/2023   FERRITIN 66 03/02/2024   FERRITIN 66 12/01/2023   FERRITIN 86 05/25/2023   IRONPCTSAT 14 03/02/2024   IRONPCTSAT 13 12/01/2023   IRONPCTSAT 15 05/25/2023    Review Flowsheet  More data exists      Latest Ref Rng & Units 05/25/2023 12/01/2023 03/02/2024  Oncology Labs  Ferritin 11 - 307 ng/mL 86  66  66   %SAT 10.4 - 31.8 % 15  13  14    Total Protein ELP 6.0 - 8.5 g/dL - 6.2  -  Albumin ELP 2.9 - 4.4 g/dL - 3.0  -  Alpha-1 Globulin 0.0 - 0.4 g/dL - 0.3  -  Alpha-2 Globulin 0.4 - 1.0 g/dL - 0.9  -  Beta Globulin 0.7 - 1.3 g/dL - 0.9  -  Gamma Globulin 0.4 - 1.8 g/dL - 1.1  -  M-Spike, % Not Observed g/dL - Not Observed  -     STUDIES:   No results found.  ASSESSMENT & PLAN:   Assessment/Plan:  64 y.o. female with mild leukocytosis and anemia.  The leukocytosis is stable.  Her anemia has worsened despite continuing B12 injections.  Repeat nutritional studies are pending from today.  This most likely represents anemia of chronic kidney disease.  As her hemoglobin is above 10, I would not  recommend erythropoietin stimulating agents at this time.  I recommended that she follow-up with Dr. Charlanne for routine screening colonoscopy.  I will plan to see her back in 3 months for repeat clinical assessment.  The patient understands all the plans discussed today and is in agreement with them.  She knows to contact our office if she develops concerns prior to her next appointment.     Andrez DELENA Foy, PA-C   Physician Assistant [facility] [place] [phone]    "

## 2024-03-06 ENCOUNTER — Telehealth: Payer: Self-pay

## 2024-03-06 NOTE — Telephone Encounter (Signed)
-----   Message from Andrez DELENA Foy sent at 03/02/2024  2:51 PM EST ----- Please let her know her vitamin levels were normal. Thanks

## 2024-03-06 NOTE — Telephone Encounter (Signed)
 Patient aware

## 2024-03-15 ENCOUNTER — Other Ambulatory Visit: Payer: Self-pay | Admitting: Cardiology

## 2024-03-15 DIAGNOSIS — I480 Paroxysmal atrial fibrillation: Secondary | ICD-10-CM

## 2024-03-16 NOTE — Telephone Encounter (Signed)
 Prescription refill request for Eliquis  received. Indication: a fib Last office visit: 05/13/23 Scr:  1.5 epic 03/02/24 Age: 64 Weight: 133.9kg

## 2024-04-25 ENCOUNTER — Ambulatory Visit

## 2024-05-07 ENCOUNTER — Ambulatory Visit: Admitting: Cardiology

## 2024-05-31 ENCOUNTER — Inpatient Hospital Stay

## 2024-05-31 ENCOUNTER — Inpatient Hospital Stay: Admitting: Hematology and Oncology
